# Patient Record
Sex: Female | Born: 1965
Health system: Southern US, Community
[De-identification: ages and names within clinical notes are randomized; demographics above are authoritative.]

## PROBLEM LIST (undated history)

## (undated) DIAGNOSIS — F329 Major depressive disorder, single episode, unspecified: Secondary | ICD-10-CM

## (undated) DIAGNOSIS — K5792 Diverticulitis of intestine, part unspecified, without perforation or abscess without bleeding: Secondary | ICD-10-CM

## (undated) DIAGNOSIS — I10 Essential (primary) hypertension: Secondary | ICD-10-CM

## (undated) DIAGNOSIS — R002 Palpitations: Secondary | ICD-10-CM

## (undated) DIAGNOSIS — F32A Depression, unspecified: Secondary | ICD-10-CM

## (undated) DIAGNOSIS — T7840XA Allergy, unspecified, initial encounter: Secondary | ICD-10-CM

## (undated) DIAGNOSIS — M1990 Unspecified osteoarthritis, unspecified site: Secondary | ICD-10-CM

## (undated) DIAGNOSIS — F419 Anxiety disorder, unspecified: Secondary | ICD-10-CM

## (undated) DIAGNOSIS — K589 Irritable bowel syndrome without diarrhea: Secondary | ICD-10-CM

## (undated) DIAGNOSIS — M62838 Other muscle spasm: Secondary | ICD-10-CM

## (undated) DIAGNOSIS — K219 Gastro-esophageal reflux disease without esophagitis: Secondary | ICD-10-CM

## (undated) DIAGNOSIS — K5900 Constipation, unspecified: Secondary | ICD-10-CM

## (undated) DIAGNOSIS — R0602 Shortness of breath: Secondary | ICD-10-CM

## (undated) DIAGNOSIS — R030 Elevated blood-pressure reading, without diagnosis of hypertension: Secondary | ICD-10-CM

## (undated) DIAGNOSIS — N83209 Unspecified ovarian cyst, unspecified side: Secondary | ICD-10-CM

## (undated) DIAGNOSIS — R609 Edema, unspecified: Secondary | ICD-10-CM

## (undated) DIAGNOSIS — R109 Unspecified abdominal pain: Secondary | ICD-10-CM

## (undated) HISTORY — PX: ABDOMINAL ADHESION SURGERY: SHX90

## (undated) HISTORY — DX: Unspecified abdominal pain: R10.9

## (undated) HISTORY — DX: Essential (primary) hypertension: I10

## (undated) HISTORY — DX: Constipation, unspecified: K59.00

## (undated) HISTORY — DX: Other muscle spasm: M62.838

## (undated) HISTORY — DX: Depression, unspecified: F32.A

## (undated) HISTORY — DX: Gastro-esophageal reflux disease without esophagitis: K21.9

## (undated) HISTORY — DX: Palpitations: R00.2

## (undated) HISTORY — DX: Unspecified ovarian cyst, unspecified side: N83.209

## (undated) HISTORY — DX: Edema, unspecified: R60.9

## (undated) HISTORY — DX: Anxiety disorder, unspecified: F41.9

## (undated) HISTORY — DX: Shortness of breath: R06.02

## (undated) HISTORY — PX: OTHER SURGICAL HISTORY: SHX169

## (undated) HISTORY — DX: Irritable bowel syndrome, unspecified: K58.9

## (undated) HISTORY — DX: Major depressive disorder, single episode, unspecified: F32.9

## (undated) HISTORY — DX: Unspecified osteoarthritis, unspecified site: M19.90

## (undated) HISTORY — PX: CHOLECYSTECTOMY: SHX55

## (undated) HISTORY — DX: Elevated blood-pressure reading, without diagnosis of hypertension: R03.0

## (undated) HISTORY — DX: Allergy, unspecified, initial encounter: T78.40XA

---

## 1998-05-09 HISTORY — PX: OTHER SURGICAL HISTORY: SHX169

## 1999-06-17 ENCOUNTER — Other Ambulatory Visit: Admission: RE | Admit: 1999-06-17 | Discharge: 1999-06-17 | Payer: Self-pay | Admitting: Emergency Medicine

## 2001-03-28 ENCOUNTER — Encounter: Admission: RE | Admit: 2001-03-28 | Discharge: 2001-03-28 | Payer: Self-pay

## 2001-06-28 ENCOUNTER — Ambulatory Visit (HOSPITAL_COMMUNITY): Admission: RE | Admit: 2001-06-28 | Discharge: 2001-06-28 | Payer: Self-pay

## 2001-10-25 ENCOUNTER — Encounter (INDEPENDENT_AMBULATORY_CARE_PROVIDER_SITE_OTHER): Payer: Self-pay

## 2001-10-25 ENCOUNTER — Inpatient Hospital Stay (HOSPITAL_COMMUNITY): Admission: AD | Admit: 2001-10-25 | Discharge: 2001-10-29 | Payer: Self-pay

## 2003-03-05 ENCOUNTER — Ambulatory Visit (HOSPITAL_COMMUNITY): Admission: RE | Admit: 2003-03-05 | Discharge: 2003-03-05 | Payer: Self-pay | Admitting: Obstetrics and Gynecology

## 2004-02-24 ENCOUNTER — Other Ambulatory Visit: Admission: RE | Admit: 2004-02-24 | Discharge: 2004-02-24 | Payer: Self-pay | Admitting: Obstetrics and Gynecology

## 2004-10-26 ENCOUNTER — Ambulatory Visit (HOSPITAL_COMMUNITY): Admission: RE | Admit: 2004-10-26 | Discharge: 2004-10-26 | Payer: Self-pay | Admitting: Obstetrics and Gynecology

## 2004-10-31 ENCOUNTER — Inpatient Hospital Stay (HOSPITAL_COMMUNITY): Admission: AD | Admit: 2004-10-31 | Discharge: 2004-10-31 | Payer: Self-pay | Admitting: Obstetrics and Gynecology

## 2004-11-04 ENCOUNTER — Inpatient Hospital Stay (HOSPITAL_COMMUNITY): Admission: AD | Admit: 2004-11-04 | Discharge: 2004-11-06 | Payer: Self-pay | Admitting: Obstetrics and Gynecology

## 2005-01-11 ENCOUNTER — Ambulatory Visit (HOSPITAL_COMMUNITY): Admission: RE | Admit: 2005-01-11 | Discharge: 2005-01-11 | Payer: Self-pay | Admitting: Obstetrics and Gynecology

## 2005-01-13 ENCOUNTER — Encounter (INDEPENDENT_AMBULATORY_CARE_PROVIDER_SITE_OTHER): Payer: Self-pay | Admitting: Specialist

## 2005-01-13 ENCOUNTER — Ambulatory Visit (HOSPITAL_COMMUNITY): Admission: RE | Admit: 2005-01-13 | Discharge: 2005-01-13 | Payer: Self-pay | Admitting: Obstetrics and Gynecology

## 2005-04-12 ENCOUNTER — Other Ambulatory Visit: Admission: RE | Admit: 2005-04-12 | Discharge: 2005-04-12 | Payer: Self-pay | Admitting: Obstetrics and Gynecology

## 2005-09-26 ENCOUNTER — Encounter: Admission: RE | Admit: 2005-09-26 | Discharge: 2005-09-26 | Payer: Self-pay | Admitting: Emergency Medicine

## 2005-11-07 ENCOUNTER — Encounter: Admission: RE | Admit: 2005-11-07 | Discharge: 2006-02-05 | Payer: Self-pay | Admitting: Emergency Medicine

## 2006-04-17 ENCOUNTER — Other Ambulatory Visit: Admission: RE | Admit: 2006-04-17 | Discharge: 2006-04-17 | Payer: Self-pay | Admitting: Obstetrics and Gynecology

## 2007-04-11 ENCOUNTER — Ambulatory Visit (HOSPITAL_COMMUNITY): Admission: RE | Admit: 2007-04-11 | Discharge: 2007-04-11 | Payer: Self-pay | Admitting: Obstetrics and Gynecology

## 2007-05-07 ENCOUNTER — Other Ambulatory Visit: Admission: RE | Admit: 2007-05-07 | Discharge: 2007-05-07 | Payer: Self-pay | Admitting: Obstetrics and Gynecology

## 2007-11-20 ENCOUNTER — Encounter: Admission: RE | Admit: 2007-11-20 | Discharge: 2007-11-20 | Payer: Self-pay | Admitting: Obstetrics and Gynecology

## 2007-11-27 ENCOUNTER — Encounter: Admission: RE | Admit: 2007-11-27 | Discharge: 2007-11-27 | Payer: Self-pay | Admitting: Gastroenterology

## 2007-11-29 ENCOUNTER — Encounter: Admission: RE | Admit: 2007-11-29 | Discharge: 2007-11-29 | Payer: Self-pay | Admitting: Gastroenterology

## 2007-12-07 ENCOUNTER — Ambulatory Visit (HOSPITAL_COMMUNITY): Admission: RE | Admit: 2007-12-07 | Discharge: 2007-12-07 | Payer: Self-pay | Admitting: Gastroenterology

## 2007-12-31 ENCOUNTER — Ambulatory Visit (HOSPITAL_COMMUNITY): Admission: RE | Admit: 2007-12-31 | Discharge: 2007-12-31 | Payer: Self-pay | Admitting: Obstetrics and Gynecology

## 2008-04-19 ENCOUNTER — Emergency Department (HOSPITAL_COMMUNITY): Admission: EM | Admit: 2008-04-19 | Discharge: 2008-04-19 | Payer: Self-pay | Admitting: Emergency Medicine

## 2008-05-13 ENCOUNTER — Other Ambulatory Visit: Admission: RE | Admit: 2008-05-13 | Discharge: 2008-05-13 | Payer: Self-pay | Admitting: Obstetrics and Gynecology

## 2009-06-05 ENCOUNTER — Other Ambulatory Visit: Admission: RE | Admit: 2009-06-05 | Discharge: 2009-06-05 | Payer: Self-pay | Admitting: Obstetrics and Gynecology

## 2010-01-12 ENCOUNTER — Ambulatory Visit (HOSPITAL_COMMUNITY): Admission: RE | Admit: 2010-01-12 | Discharge: 2010-01-12 | Payer: Self-pay | Admitting: Obstetrics and Gynecology

## 2010-02-25 ENCOUNTER — Ambulatory Visit (HOSPITAL_COMMUNITY): Admission: RE | Admit: 2010-02-25 | Discharge: 2010-02-25 | Payer: Self-pay | Admitting: Obstetrics and Gynecology

## 2010-05-30 ENCOUNTER — Encounter: Payer: Self-pay | Admitting: Obstetrics and Gynecology

## 2010-07-21 LAB — CBC
MCH: 31.6 pg (ref 26.0–34.0)
MCHC: 34.2 g/dL (ref 30.0–36.0)
MCV: 92.3 fL (ref 78.0–100.0)
Platelets: 234 10*3/uL (ref 150–400)
RBC: 4.31 MIL/uL (ref 3.87–5.11)
WBC: 7.7 10*3/uL (ref 4.0–10.5)

## 2010-09-21 NOTE — Op Note (Signed)
Donna [name], Donna [name]              ACCOUNT NO.:  [id number]   MEDICAL RECORD NO.:  [id number]          PATIENT TYPE:  AMB   LOCATION:  SDC                           FACILITY:  [facility]   PHYSICIAN:  Charles A. Delcambre, MDDATE OF BIRTH:  August 02, 1965   DATE OF PROCEDURE:  12/31/2007  DATE OF DISCHARGE:                               OPERATIVE REPORT   PREOPERATIVE DIAGNOSIS:  Pelvic pain.   POSTOPERATIVE DIAGNOSIS:  Pelvic adhesive disease.   PROCEDURE:  1. Diagnostic laparoscopy.  2. Lysis of adhesions.   SURGEON:  Charles A. Delcambre, MD   ASSISTANT:  None.   COMPLICATIONS:  None.   ESTIMATED BLOOD LOSS:  Less than 5 mL.   OPERATIVE FINDINGS:  Left colon adherent up to the left side wall.   INSTRUMENT, SPONGE, AND NEEDLE COUNT:  Correct x2.   DESCRIPTION OF PROCEDURE:  The patient was taken to the operating room  and placed in supine position.  General anesthetic was induced without  difficulty.  She was then placed in dorsal lithotomy position.  Acorn  cannula was placed on the cervix with a tenaculum.  Attention was turned  to the abdomen where 0.25% plain Marcaine was injected in the suprapubic  side as well as the umbilical side.  A total of 9 mL was divided.  A 1-  cm incision was made at the umbilicus.  Traction on the anterior  abdominal wall was used to place a Veress needle.  Aspiration injection,  reaspiration, hanging drop test and insufflation pressures less than 8  mmHg at 2 liters per minute.  CO2 insufflation yielded intraperitoneal  location.  This was verified by traction of the abdomen after 2.5 liters  were insufflated and 10-mm trocar was placed without difficulty.  The  scope was placed.  Immediate verification of intraperitoneal location  was noted.  Under direct visualization, suprapubic port was made through  a stab incision.  There was no damage to bowel, bladder, or vascular  structures.  Manipulating rod was placed to the lower trocar.  Multiple  pictures were taken to show normal anatomy.  The only abnormality was  the colon; tented up quite sharply on the left pelvic side wall, left  abdominal side wall, posterior cul-de-sac, ureters, ovaries, uterus,  anterior cul-de-sac, gallbladder, appendix, and bowel, in general all  looked normal.  Unipolar cautery was used with Metzenbaum scissors to  take down the adhesion fully, dropping the colon peritoneum down.  Hemostasis was achieved with bipolar cautery and was excellent.  Desufflation was allowed to occur.  There was no damage to bowel noted,  and the sleeve was viewed upon withdrawal of the trocar at the  umbilicus.  A 0 Vicryl was placed to close the fascia at  the umbilicus, 3-0 Monocryl was used to close the skin.  Dermabond was  used to close the lower trocar skin.  Tenaculum was removed.  Hemostasis  was excellent.  The patient was awakened and taken to recovery with  persistent tenderness having tolerated the procedure well.      Charles A. Sydnee Cabal, MD  Electronically Signed  CAD/MEDQ  D:  12/31/2007  T:  01/01/2008  Job:  [id number]

## 2010-09-21 NOTE — H&P (Signed)
[name], [name]              ACCOUNT NO.:  [id number]   MEDICAL RECORD NO.:  [id number]          PATIENT TYPE:  AMB   LOCATION:  SDC                           [facility]  [facility]   PHYSICIAN:  Donna [name], MDDATE OF BIRTH:  10-03-1965   DATE OF ADMISSION:  12/31/2007  DATE OF DISCHARGE:                              HISTORY & PHYSICAL   The patient to be admitted on December 31, 2007, to undergo diagnostic  laparoscopy, pelvic pain is across the pelvis, seems to start on the  right, go to the left, but mainly then remained in the worse type pain  in the left lower quadrant.  She has recurrent bloating.  Ultrasound  shows an IUD to be in place, but no other abnormalities.  GC and  chlamydia cultures were negative.  She is a 45 year old gravida 2, para  2.  Denies urgency, frequency, or dysuria.  No diarrhea, no significant  constipation, and no bleeding per rectum.   PAST MEDICAL HISTORY:  Seasonal allergies.   SURGICAL HISTORY:  Knee surgery, SVD x2, wisdom teeth, and D&C.   MEDICATIONS:  Vitamins, iron, and charcoal tablets.   ALLERGIES:  SULFA, CODEINE, and LEVAQUIN, reactions not specified.   SOCIAL HISTORY:  No tobacco, ethanol, or drug use, or STD exposure in  the past.  She is married and in monogamous relationship with her  husband of 8 years.   FAMILY HISTORY:  Breast cancer in grandmother and maternal aunt.  Cardiac history on her father side, father deceased from stroke,  dialysis with recent coronary artery disease.   REVIEW OF SYSTEMS:  Denies fever, chills, rashes, lesions on the skin,  headaches, dizziness, current seasonal allergies, chest pain, shortness  of breath, wheezing, diarrhea, constipation, bleeding, melena,  hematochezia, urgency or frequency, dysuria, incontinence, hematuria,  galactorrhea, or emotional changes.   PHYSICAL EXAMINATION:  GENERAL:  Alert and oriented x3.  VITAL SIGNS:  Blood pressure 130/88, heart rate 80, respirations 20,  and  afebrile.  HEART:  Regular rate and rhythm.  LUNGS:  Clear bilaterally.  ABDOMEN:  Nontender to palpation in all quadrants.  PELVIC:  Normal external female genitalia.  Bartholin, urethral, and  Skene within normal limits.  Vagina without discharge or lesions.  IUD  strings were seen.  GC and chlamydia cultures previously done were  negative.  Uterus is nontender.  Right adnexa is nontender.  Left adnexa  is moderately tender, reproducing her pain findings, especially when I  reach out at the pelvis with bimanual examination.   ASSESSMENT:  Pelvic pain.   PLAN:  Diagnostic laparoscopy.  She accepts risks of infection,  bleeding, bowel and bladder damage, blood product risk including  hepatitis C, HIV exposure, and ureteral damage.  All questions were  answered.  She gives informed consent.  She understands that we may not  find anything and cannot guarantee that we will find anything on surgery  or relieve  her pain.  She had a GI workup, which was basically  negative.  We will proceed as outlined NPO post midnight beginning  morning medications with a sip of  water.  Further information, she did  have a CT of the abdomen and pelvis, which was a benign pelvis and  benign abdomen, and ultrasound was negative.      Donna A. Sydnee Cabal, MD  Electronically Signed     CAD/MEDQ  D:  12/27/2007  T:  12/28/2007  Job:  [id number]

## 2010-09-21 NOTE — H&P (Signed)
Donna [name], Donna [name]              ACCOUNT NO.:  [id number]   MEDICAL RECORD NO.:  [id number]          PATIENT TYPE:  AMB   LOCATION:  SDC                           [facility]   PHYSICIAN:  Charles A. Delcambre, MDDATE OF BIRTH:  Jul 20, 1965   DATE OF ADMISSION:  DATE OF DISCHARGE:                              HISTORY & PHYSICAL   The patient is to be admitted on November 21, 2007 for diagnostic  laparoscopy.  She has pelvic pain mainly localized over the left side in  the left lower quadrant and a bit of that in the pelvis.  She complains  of recurrent bloating and intermittently is tearful, but then will get  joking around.  She states she is frustrated about nothing being found  with Dr. Georgina Pillion doing upper abdomen ultrasound.  She had an ultrasound  today in our office which shows an IUD in place.  GC chlamydia cultures  were done as well.  No adnexal masses were seen and the patient was  scanned over the area of her pain in the left lower quadrant.  No masses  or free fluid was seen.  Ovaries appeared normal.  She is a gravida 2,  para 2, and 45 years old.  She has no urgency, frequency, or dysuria.  No diarrhea, no significant constipation, no blood per rectum, and of  course is painful secondary to her left lower quadrant pain.   PAST MEDICAL HISTORY:  Seasonal allergies.   PAST SURGICAL HISTORY:  Knee surgery, STD x2, wisdom teeth, D&C.   MEDICATIONS:  Vitamins, iron, and charcoal tablets.   ALLERGIES:  SULFA, CODEINE, and LEVAQUIN, reactions not specified.   SOCIAL HISTORY:  Denies tobacco, ethanol, or drug use or STD exposure in  the past.  She is married in a monogamous relationship with her husband  of 80 years.   FAMILY HISTORY:  Breast cancer in her grandmother and maternal aunt.  Cardiac history on her father's side.  Father deceased from stroke,  dialysis, obesity, and coronary artery disease.   REVIEW OF SYSTEMS:  Denies fever, chills, rashes, lesions on skin,  headaches, dizziness, current seasonal allergies, chest pain, shortness  of breath, wheezing, diarrhea, constipation, bleeding, melena,  hematochezia, urgency, frequency, dysuria, incontinence, hematuria,  galactorrhea, or emotional changes.   PHYSICAL EXAMINATION:  GENERAL:  Alert and oriented x3.  VITAL SIGNS:  Blood pressure 120/90, heart rate 80, respirations 20,  afebrile.  CORONARY:  Regular rate and rhythm.  LUNGS:  Clear bilaterally.  ABDOMEN:  Upper quadrant soft and nontender.  Right lower quadrant  nontender.  Suprapubic area minimally tender.  Left lower quadrant  moderately tender but is easily palpated.  No guarding or peritoneal  signs are present.  PELVIC:  Normal external female genitalia.  Bartholin, urethra, Skene  within normal limit.  Vulva without discharge or lesions.  IUD strings  were seen.  GC chlamydia cultures were done today.  Uterus is nontender.  Right adnexa is nontender.  Left adnexa is moderately tender reproducing  her pain findings especially when reaches out of the pelvis with  bimanual exam.  ASSESSMENT:  Pelvic pain.   PLAN:  Diagnostic laparoscopy.  She accepts the risks of infection,  bleeding, bowel and bladder damage, blood product risks including  hepatitis and HIV exposure, ureteral damage.  All questions were  answered.  She gives informed consent.  She understands that we may not  find anything and cannot guarantee that we will find anything on  surgery.  She has not had a GI workup and I have discussed with her that  I feel like more prudent that she have a GI workup before the  gynecological surgery with her bloating symptoms and left lower quadrant  pain with normal ovaries seen.  She declines, however, to go to GI at  this point and request that we proceed with gynecological workup first.  I have informed her that I think she should go to GI and she refuses  until after this surgery.  We will proceed as outlined, n.p.o. after   0700, surgery about 1:30 Wednesday, and preoperative CBC, HCG, and  pregnancy test.  We will get a CT of the abdomen and pelvis tomorrow  with contrast to further evaluate bowel before surgery the next day. GC  chlamydia  cultures were pending after being done today.      Charles A. Sydnee Cabal, MD  Electronically Signed     CAD/MEDQ  D:  11/19/2007  T:  11/20/2007  Job:  [id number]   cc:   Oley Balm. Georgina Pillion, M.D.  Fax: [phone]

## 2010-09-24 NOTE — H&P (Signed)
Donna [name], Donna [name]              ACCOUNT NO.:  [id number]   MEDICAL RECORD NO.:  [id number]          PATIENT TYPE:  OUT   LOCATION:  ULT                           [facility]   PHYSICIAN:  Charles A. Delcambre, MDDATE OF BIRTH:  01/11/1966   DATE OF ADMISSION:  01/13/2005  DATE OF DISCHARGE:                                HISTORY & PHYSICAL   CHIEF COMPLAINT:  1.  Abnormal uterine bleeding  2.  Possible retained products of conception, postpartum, eight weeks.   HISTORY OF PRESENT ILLNESS:  A 45 year old, para 2-0-0-2 presents now with  bleeding off and on to this point almost continuously with some spurts.  Seen by Dr. [name] last week with hCG negative.  Hemoglobin 13.4,  hematocrit 38.8, platelets normal at 336.  Denies fever or chills.  Ultrasound was done which showed heterogeneously thickened endometrium to  the right of the uterine fundus measuring maximum dimension of 9 mm without  increased flow of the Doppler.  Concern for retained products of conception  with differential including debris or clot.  Postpartum course was  complicated by approximately one week postpartum, low grade temperature  100.6 range, and pelvic pain.  The patient treated at that time for  postpartum metritis with good response to Augmentin.  She gives informed  consent for a D&C.  Accepts risks for infection, bleeding, bowel and bladder  damage, uterine perforation risk, blood product risk including hepatitis and  HIV exposure, possibility of failed D&C, repeat D&C.   PAST MEDICAL HISTORY:  1.  Seasonal allergies.  2.  Pregnancy-induced hypertension in the past.   PAST SURGICAL HISTORY:  1.  Knee surgery.  2.  SVD x2.  3.  Wisdom teeth.   MEDICATIONS:  Currently none.  Recently completed Z-pack for a URI with  complete resolution of symptoms now.   ALLERGIES:  LEVAQUIN, SULFA, TEQUIN, CODEINE.   SOCIAL HISTORY:  No tobacco, ethanol or drug use or STD exposure in the  past.  The  patient is married, in a monogamous relationship with her  husband.   FAMILY HISTORY:  Breast cancer in grandmother and maternal aunt.  Cardiac  history on her father's side.  Father deceased with a stroke, dialysis and  obesity and coronary artery disease.  Otherwise negative major illnesses.   REVIEW OF SYSTEMS:  No fever or chills or skin rashes.  No chest pain,  shortness of breath, wheezing.  No diarrhea, constipation or bleeding.  No  urgency, frequency or dysuria.   PHYSICAL EXAMINATION:  GENERAL:  Alert and oriented x3.  No distress.  VITAL SIGNS:  Blood pressure 130/84, respirations 20, pulse 88.  HEENT:  Grossly within normal limits.  NECK:  Supple without thyromegaly or adenopathy.  LUNGS:  Clear bilaterally.  HEART:  Regular rate and rhythm without murmur, rub or gallop.  BREASTS:  Symmetrical. Otherwise deferred.  The patient is lactating.  ABDOMEN:  Soft, flat and nontender.  No hepatosplenomegaly or other masses  noted.  PELVIC:  Normal external female genitalia.  Bartholin, urethral and Skene's  glands within normal limits. Vault without discharge or lesions.  Uterus not  enlarged, mobile, nontender, midplane.  Adnexa nontender without masses  bilaterally.  EXTREMITIES:  Nontender without significant edema.   ASSESSMENT:  Abnormal uterine bleeding eight weeks postpartum.  The  patient's endometrial finding on ultrasound for possible nidus of retained  tissue.   PLAN:  The patient will be admitted for D&C.  Informed consent was given as  noted above.  She will remain NPO past midnight this evening.  Clears until  0700 in the morning.  N.p.o. thereafter.  Questions were answered. Informed  consent was given. We will proceed as outlined.      Charles A. Sydnee Cabal, MD  Electronically Signed     CAD/MEDQ  D:  01/12/2005  T:  01/12/2005  Job:  [id number]

## 2010-09-24 NOTE — Op Note (Signed)
[name], [name]              ACCOUNT NO.:  [id number]   MEDICAL RECORD NO.:  [id number]          PATIENT TYPE:  AMB   LOCATION:  SDC                           [facility]   PHYSICIAN:  Charles A. Delcambre, MDDATE OF BIRTH:  1965/12/14   DATE OF PROCEDURE:  01/13/2005  DATE OF DISCHARGE:                                 OPERATIVE REPORT   PREOPERATIVE DIAGNOSIS:  Seven-week postpartum possible retained products of  conception.   POSTOPERATIVE DIAGNOSIS:  Seven-week postpartum with likely retained  products of conception.   OPERATION/PROCEDURE:  1.  Paracervical block.  2.  Dilatation and curettage.   SURGEON:  Charles A. Sydnee Cabal, M.D.   ASSISTANT:  None.   COMPLICATIONS:  None.   ESTIMATED BLOOD LOSS:  Less than 50 mL.   ANESTHESIA:  Monitored anesthesia care with IV sedation.   SPECIMENS:  1.  Products of conception (?).  2.  Endometrial curettings.   OPERATIVE FINDINGS:  Moderate amount of necrotic-appearing tissue,  consistent with retained products of conception/placenta.   DESCRIPTION OF PROCEDURE:  The patient was taken to the operating room and  placed in the supine position and sedation was accomplished.  She was placed  in the dorsal lithotomy position and universal stirrups and sterile prep and  drape was undertaken.  A single-tooth tenaculum was placed on the cervix  with the speculum in the vagina and paracervical block was placed with 0.25%  plain Marcaine, 20 mL divided at 4 and 8 o'clock.  No evidence of  intravascular location of injection was noted.  Cervix was opened enough to  pass a medium Hicks dilator and a medium size banjo curet without dilation.  Banjo curet was placed and gentle curettage yielded tissue as noted  above.  Gentle circumferential curettage returned no further tissue.  Hemostasis was adequate.  The tenaculum was removed.  Hemostasis was  verified.  All instruments were removed.  The patient tolerated the  procedure well  and was taken to the recovery room with physician in  attendance.      Charles A. Sydnee Cabal, MD  Electronically Signed     CAD/MEDQ  D:  01/13/2005  T:  01/14/2005  Job:  [id number]

## 2011-06-09 ENCOUNTER — Other Ambulatory Visit (HOSPITAL_COMMUNITY)
Admission: RE | Admit: 2011-06-09 | Discharge: 2011-06-09 | Disposition: A | Discharge status: Home / Self Care | Payer: BC Managed Care – PPO | Source: Ambulatory Visit | Attending: Obstetrics and Gynecology | Admitting: Obstetrics and Gynecology

## 2011-06-09 ENCOUNTER — Other Ambulatory Visit: Payer: Self-pay | Admitting: Nurse Practitioner

## 2011-06-09 DIAGNOSIS — Z01419 Encounter for gynecological examination (general) (routine) without abnormal findings: Secondary | ICD-10-CM | POA: Insufficient documentation

## 2011-06-09 DIAGNOSIS — Z1159 Encounter for screening for other viral diseases: Secondary | ICD-10-CM | POA: Insufficient documentation

## 2013-08-05 ENCOUNTER — Telehealth: Payer: Self-pay

## 2013-08-05 ENCOUNTER — Ambulatory Visit: Payer: Self-pay | Admitting: Family Medicine

## 2013-08-05 VITALS — BP 112/86 | HR 86 | Temp 99.3°F | Resp 16 | Ht 63.75 in | Wt 169.0 lb

## 2013-08-05 DIAGNOSIS — K5732 Diverticulitis of large intestine without perforation or abscess without bleeding: Secondary | ICD-10-CM

## 2013-08-05 DIAGNOSIS — R358 Other polyuria: Secondary | ICD-10-CM

## 2013-08-05 DIAGNOSIS — R3589 Other polyuria: Secondary | ICD-10-CM

## 2013-08-05 DIAGNOSIS — M549 Dorsalgia, unspecified: Secondary | ICD-10-CM

## 2013-08-05 LAB — POCT UA - MICROSCOPIC ONLY
Bacteria, U Microscopic: NEGATIVE
Casts, Ur, LPF, POC: NEGATIVE
Crystals, Ur, HPF, POC: NEGATIVE
Epithelial cells, urine per micros: NEGATIVE
Mucus, UA: NEGATIVE
WBC, Ur, HPF, POC: NEGATIVE
Yeast, UA: NEGATIVE

## 2013-08-05 LAB — POCT URINALYSIS DIPSTICK
Bilirubin, UA: NEGATIVE
Glucose, UA: NEGATIVE
Ketones, UA: NEGATIVE
Leukocytes, UA: NEGATIVE
Nitrite, UA: NEGATIVE
Protein, UA: NEGATIVE
Spec Grav, UA: 1.015
Urobilinogen, UA: 0.2
pH, UA: 8.5

## 2013-08-05 MED ORDER — CIPROFLOXACIN HCL 500 MG PO TABS: 500.0000 mg | ORAL_TABLET | Freq: Two times a day (BID) | ORAL | Status: DC

## 2013-08-05 MED ORDER — CEPHALEXIN 500 MG PO CAPS: 500.0000 mg | ORAL_CAPSULE | Freq: Two times a day (BID) | ORAL | Status: DC

## 2013-08-05 MED ORDER — METRONIDAZOLE 500 MG PO TABS: 500.0000 mg | ORAL_TABLET | Freq: Three times a day (TID) | ORAL | Status: DC

## 2013-08-05 NOTE — Telephone Encounter (Signed)
Patient is taking antibiotic for a diverticulitis flare up. Please advise if it is possible to change this to something alcohol friendly.

## 2013-08-05 NOTE — Patient Instructions (Signed)
Diverticulitis °A diverticulum is a small pouch or sac on the colon. Diverticulosis is the presence of these diverticula on the colon. Diverticulitis is the irritation (inflammation) or infection of diverticula. °CAUSES  °The colon and its diverticula contain bacteria. If food particles block the tiny opening to a diverticulum, the bacteria inside can grow and cause an increase in pressure. This leads to infection and inflammation and is called diverticulitis. °SYMPTOMS  °· Abdominal pain and tenderness. Usually, the pain is located on the left side of your abdomen. However, it could be located elsewhere. °· Fever. °· Bloating. °· Feeling sick to your stomach (nausea). °· Throwing up (vomiting). °· Abnormal stools. °DIAGNOSIS  °Your caregiver will take a history and perform a physical exam. Since many things can cause abdominal pain, other tests may be necessary. Tests may include: °· Blood tests. °· Urine tests. °· X-ray of the abdomen. °· CT scan of the abdomen. °Sometimes, surgery is needed to determine if diverticulitis or other conditions are causing your symptoms. °TREATMENT  °Most of the time, you can be treated without surgery. Treatment includes: °· Resting the bowels by only having liquids for a few days. As you improve, you will need to eat a low-fiber diet. °· Intravenous (IV) fluids if you are losing body fluids (dehydrated). °· Antibiotic medicines that treat infections may be given. °· Pain and nausea medicine, if needed. °· Surgery if the inflamed diverticulum has burst. °HOME CARE INSTRUCTIONS  °· Try a clear liquid diet (broth, tea, or water for as long as directed by your caregiver). You may then gradually begin a low-fiber diet as tolerated.  °A low-fiber diet is a diet with less than 10 grams of fiber. Choose the foods below to reduce fiber in the diet: °· White breads, cereals, rice, and pasta. °· Cooked fruits and vegetables or soft fresh fruits and vegetables without the skin. °· Ground or  well-cooked tender beef, ham, veal, lamb, pork, or poultry. °· Eggs and seafood. °· After your diverticulitis symptoms have improved, your caregiver may put you on a high-fiber diet. A high-fiber diet includes 14 grams of fiber for every 1000 calories consumed. For a standard 2000 calorie diet, you would need 28 grams of fiber. Follow these diet guidelines to help you increase the fiber in your diet. It is important to slowly increase the amount fiber in your diet to avoid gas, constipation, and bloating. °· Choose whole-grain breads, cereals, pasta, and brown rice. °· Choose fresh fruits and vegetables with the skin on. Do not overcook vegetables because the more vegetables are cooked, the more fiber is lost. °· Choose more nuts, seeds, legumes, dried peas, beans, and lentils. °· Look for food products that have greater than 3 grams of fiber per serving on the Nutrition Facts label. °· Take all medicine as directed by your caregiver. °· If your caregiver has given you a follow-up appointment, it is very important that you go. Not going could result in lasting (chronic) or permanent injury, pain, and disability. If there is any problem keeping the appointment, call to reschedule. °SEEK MEDICAL CARE IF:  °· Your pain does not improve. °· You have a hard time advancing your diet beyond clear liquids. °· Your bowel movements do not return to normal. °SEEK IMMEDIATE MEDICAL CARE IF:  °· Your pain becomes worse. °· You have an oral temperature above 102° F (38.9° C), not controlled by medicine. °· You have repeated vomiting. °· You have bloody or black, tarry stools. °·   Symptoms that brought you to your caregiver become worse or are not getting better. °MAKE SURE YOU:  °· Understand these instructions. °· Will watch your condition. °· Will get help right away if you are not doing well or get worse. °Document Released: 02/02/2005 Document Revised: 07/18/2011 Document Reviewed: 05/31/2010 °[employer]® Patient Information  ©2014 [employer]. ° °

## 2013-08-05 NOTE — Telephone Encounter (Signed)
Dr. Milus GlazierLauenstein,  I'm not clear from your note if this was an incidental finding on your exam. Please advise patient regarding putting off the metronidazole until after her wedding on Friday 08/09/2013.

## 2013-08-05 NOTE — Progress Notes (Signed)
48 yo with polyuria and back pain x 3 weeks.  [facility] and urinary tests.  Sig Negs:  Known fever  Associated:  Chills, paresthesias  Objective:  NAD Abdomen:  Soft, tender LLQ with some guarding, no rebound Ext:  Normal inspection of feet. Skin:  No rash  Results for orders placed during the hospital encounter of 02/25/10  CBC      Result Value Ref Range   WBC 7.7  4.0 - 10.5 K/uL   RBC 4.31  3.87 - 5.11 MIL/uL   Hemoglobin 13.6  12.0 - 15.0 g/dL   HCT 16.139.8  09.636.0 - 04.546.0 %   MCV 92.3  78.0 - 100.0 fL   MCH 31.6  26.0 - 34.0 pg   MCHC 34.2  30.0 - 36.0 g/dL   RDW 40.912.7  81.111.5 - 91.415.5 %   Platelets 234  150 - 400 K/uL  PREGNANCY, URINE      Result Value Ref Range   Preg Test, Ur NEGATIVE     Results for orders placed in visit on 08/05/13  POCT URINALYSIS DIPSTICK      Result Value Ref Range   Color, UA lt yellow     Clarity, UA clear     Glucose, UA neg     Bilirubin, UA neg     Ketones, UA neg     Spec Grav, UA 1.015     Blood, UA trace     pH, UA 8.5     Protein, UA neg     Urobilinogen, UA 0.2     Nitrite, UA neg     Leukocytes, UA Negative    POCT UA - MICROSCOPIC ONLY      Result Value Ref Range   WBC, Ur, HPF, POC neg     RBC, urine, microscopic 0-2     Bacteria, U Microscopic neg     Mucus, UA neg     Epithelial cells, urine per micros neg     Crystals, Ur, HPF, POC neg     Casts, Ur, LPF, POC neg     Yeast, UA neg     Assessment: Exam strongly suggests mild diverticulitis. Negative urine tends to exclude kidney stones and UTI.  She's never had a problem with Cipro.   Back pain - Plan: POCT urinalysis dipstick, POCT UA - Microscopic Only, ciprofloxacin (CIPRO) 500 MG tablet, metroNIDAZOLE (FLAGYL) 500 MG tablet  Polyuria - Plan: POCT urinalysis dipstick, POCT UA - Microscopic Only  Diverticulitis of colon (without mention of hemorrhage) - Plan: ciprofloxacin (CIPRO) 500 MG tablet,  metroNIDAZOLE (FLAGYL) 500 MG tablet  Signed, Elvina SidleKurt Malea Swilling, MD

## 2013-08-05 NOTE — Telephone Encounter (Signed)
Pt states that she will be getting married Friday, and the medication flagyl will not allow her to drink alcohol. She would like to know if she could put off taking this medication until after her wedding or can she get something else that will allow her to drink alcohol.     Pharmacy: [facility] on [address] [phone]

## 2013-08-06 ENCOUNTER — Telehealth: Payer: Self-pay

## 2013-08-06 NOTE — Telephone Encounter (Signed)
Spoke to pt, advised her to stop taking flagyl, and p/u keflex that has been sent to pharm.  She is aware.

## 2013-09-23 ENCOUNTER — Ambulatory Visit (INDEPENDENT_AMBULATORY_CARE_PROVIDER_SITE_OTHER): Payer: Self-pay | Admitting: Family Medicine

## 2013-09-23 ENCOUNTER — Ambulatory Visit (HOSPITAL_COMMUNITY)
Admission: RE | Admit: 2013-09-23 | Discharge: 2013-09-23 | Disposition: A | Discharge status: Home / Self Care | Payer: BC Managed Care – PPO | Source: Ambulatory Visit | Attending: Family Medicine | Admitting: Family Medicine

## 2013-09-23 VITALS — BP 130/80 | HR 86 | Temp 99.0°F | Resp 18 | Ht 62.5 in | Wt 165.2 lb

## 2013-09-23 DIAGNOSIS — K5732 Diverticulitis of large intestine without perforation or abscess without bleeding: Secondary | ICD-10-CM

## 2013-09-23 DIAGNOSIS — R1032 Left lower quadrant pain: Secondary | ICD-10-CM

## 2013-09-23 DIAGNOSIS — M549 Dorsalgia, unspecified: Secondary | ICD-10-CM

## 2013-09-23 DIAGNOSIS — R109 Unspecified abdominal pain: Secondary | ICD-10-CM

## 2013-09-23 LAB — IFOBT (OCCULT BLOOD): IFOBT: NEGATIVE

## 2013-09-23 LAB — POCT CBC
GRANULOCYTE PERCENT: 72.7 % (ref 37–80)
HEMATOCRIT: 35.6 % — AB (ref 37.7–47.9)
HEMOGLOBIN: 12 g/dL — AB (ref 12.2–16.2)
Lymph, poc: 2 (ref 0.6–3.4)
MCH: 31.3 pg — AB (ref 27–31.2)
MCHC: 33.7 g/dL (ref 31.8–35.4)
MCV: 92.6 fL (ref 80–97)
MID (CBC): 0.5 (ref 0–0.9)
MPV: 9 fL (ref 0–99.8)
PLATELET COUNT, POC: 240 10*3/uL (ref 142–424)
POC Granulocyte: 6.8 (ref 2–6.9)
POC LYMPH PERCENT: 21.6 %L (ref 10–50)
POC MID %: 5.7 % (ref 0–12)
RBC: 3.84 M/uL — AB (ref 4.04–5.48)
RDW, POC: 13.4 %
WBC: 9.4 10*3/uL (ref 4.6–10.2)

## 2013-09-23 MED ORDER — IOHEXOL 300 MG/ML  SOLN
100.0000 mL | Freq: Once | INTRAMUSCULAR | Status: AC | PRN
  Administered 2013-09-23: 100 mL via INTRAVENOUS

## 2013-09-23 NOTE — Progress Notes (Signed)
This chart was scribed for Donna SorensonEva Shaw, MD by Beverly MilchJ Harrison Collins, ED Scribe. This patient was seen in room 2 and the patient's care was started at 7:43 PM.  Subjective:    Patient ID: Donna [name], female    DOB: 07/03/1965, 48 y.o.   MRN: [id number]   Chief Complaint  Patient presents with  . Diverticulosis    seen last month LLQ pain    HPI   HPI Comments: Donna [name] is a 48 y.o. female who presents to the [facility] complaining of abdominal pain. Pt saw Dr. Milus GlazierLauenstein 6 wks ago and was diagnosed with mild diverticulitis by exam due to LLQ tenderness. Pt believes she is still feeling LLQ abdominal pain that she characterizes as "pressure," and feeling worse than her last visit. She states the antibiotics she was prescribed caused her pain with urination and BMs. Pt denies diarrhea and constipation and states her stool was normal. She states her BMs and urination burning continued a few weeks after she finished the prescribed antibiotic. She states she used some Preparation H rectal suppositories without relief. She notes she had some nerve pain and numbness in her feet and legs during her diverticulitis that subsided after the antibiotics. She states the pain to the buttocks down the back of her left legs has begun to return. She states she can't sleep and she's very tender to the touch over her LLQ. Pt states she has a high fiber diet and very much enjoys raw vegetables. She notes eating these foods incites her pain. Reports w/ her prev diverticulitis 6 wks ago she took cipro and keflex as she declined to take flagyl since she didn't want to abstain from EtOH for a week but willing to now if absolutely necessary.   There are no active problems to display for this patient.   Past Medical History  Diagnosis Date  . Allergy     Past Surgical History  Procedure Laterality Date  . Abdominal adhesion surgery      Allergies  Allergen Reactions  . Codeine  Hives  . Levaquin [Levofloxacin In D5w] Other (See Comments)    Blisters on hands  . Omnipred [Prednisolone Acetate] Other (See Comments)    Migraine, emesis  . Sulfa Antibiotics Nausea And Vomiting  . Tequin [Gatifloxacin] Other (See Comments)    Blisters on hands    Prior to Admission medications   Medication Sig Start Date End Date Taking? Authorizing Provider  cephALEXin (KEFLEX) 500 MG capsule Take 1 capsule (500 mg total) by mouth 2 (two) times daily. 08/05/13  Yes Elvina SidleKurt Lauenstein, MD  ciprofloxacin (CIPRO) 500 MG tablet Take 1 tablet (500 mg total) by mouth 2 (two) times daily. 08/05/13  Yes Elvina SidleKurt Lauenstein, MD  levonorgestrel (MIRENA) 20 MCG/24HR IUD 1 each by Intrauterine route once.   Yes Historical Provider, MD  metroNIDAZOLE (FLAGYL) 500 MG tablet Take 1 tablet (500 mg total) by mouth 3 (three) times daily. 08/05/13   Elvina SidleKurt Lauenstein, MD  nitrofurantoin (MACRODANTIN) 100 MG capsule Take 100 mg by mouth 4 (four) times daily.    Historical Provider, MD    History   Social History  . Marital Status: Divorced    Spouse Name: N/A    Number of Children: N/A  . Years of Education: N/A   Occupational History  . Not on file.   Social History Main Topics  . Smoking status: Never Smoker   . Smokeless tobacco: Never Used  .  Alcohol Use: Yes  . Drug Use: No  . Sexual Activity: Not on file   Other Topics Concern  . Not on file   Social History Narrative  . No narrative on file      Review of Systems  Constitutional: Positive for activity change. Negative for fever and chills.  HENT: Negative for congestion, ear discharge, ear pain, facial swelling, postnasal drip, rhinorrhea, sinus pressure, sneezing and sore throat.   Eyes: Negative for pain and discharge.  Respiratory: Negative for apnea, cough, chest tightness and shortness of breath.   Cardiovascular: Negative for chest pain and leg swelling.  Gastrointestinal: Positive for abdominal pain, anal bleeding and rectal  pain. Negative for nausea, vomiting, diarrhea, constipation, blood in stool and abdominal distention.       Pt reports pain with urination and defecation.  Endocrine: Negative for polydipsia and polyuria.  Genitourinary: Positive for dysuria. Negative for urgency, frequency, hematuria, flank pain, decreased urine volume, vaginal bleeding, vaginal discharge and vaginal pain.  Musculoskeletal: Positive for arthralgias, back pain and myalgias.  Neurological: Positive for numbness. Negative for headaches.     Objective:   Physical Exam  Constitutional: She is oriented to person, place, and time. She appears well-developed and well-nourished. She appears ill. No distress.  Pt has slowed gait and clenching abdomen.  HENT:  Head: Normocephalic and atraumatic.  Neck: Normal range of motion. Neck supple. No thyromegaly present.  Cardiovascular: Normal rate, regular rhythm, normal heart sounds and intact distal pulses.   Pulmonary/Chest: Effort normal and breath sounds normal. No respiratory distress.  Abdominal: Soft. Normal appearance. She exhibits no mass. Bowel sounds are decreased. There is no hepatosplenomegaly. There is tenderness in the left lower quadrant. There is rebound. There is no rigidity, no guarding, no CVA tenderness and no tenderness at McBurney's point.  Genitourinary: Rectum normal, vagina normal and uterus normal. Rectal exam shows no mass and no tenderness. Guaiac negative stool. There is no rash, tenderness or lesion on the right labia. There is no rash, tenderness or lesion on the left labia. Uterus is not tender. Cervix exhibits no motion tenderness and no friability. Right adnexum displays no mass, no tenderness and no fullness. Left adnexum displays no mass, no tenderness and no fullness. No erythema or tenderness around the vagina.  Musculoskeletal: She exhibits no edema.  Lymphadenopathy:    She has no cervical adenopathy.       Right: No inguinal adenopathy present.        Left: No inguinal adenopathy present.  Neurological: She is alert and oriented to person, place, and time.  Skin: Skin is warm and dry. She is not diaphoretic. No erythema.  Psychiatric: She has a normal mood and affect. Her behavior is normal.    Triage Vitals: BP 130/80  Pulse 86  Temp(Src) 99 F (37.2 C) (Oral)  Resp 18  Ht 5' 2.5" (1.588 m)  Wt 165 lb 3.2 oz (74.934 kg)  BMI 29.72 kg/m2  SpO2 100%  Results for orders placed in visit on 09/23/13  POCT CBC      Result Value Ref Range   WBC 9.4  4.6 - 10.2 K/uL   Lymph, poc 2.0  0.6 - 3.4   POC LYMPH PERCENT 21.6  10 - 50 %L   MID (cbc) 0.5  0 - 0.9   POC MID % 5.7  0 - 12 %M   POC Granulocyte 6.8  2 - 6.9   Granulocyte percent 72.7  37 - 80 %G  RBC 3.84 (*) 4.04 - 5.48 M/uL   Hemoglobin 12.0 (*) 12.2 - 16.2 g/dL   HCT, POC 82.9 (*) 56.2 - 47.9 %   MCV 92.6  80 - 97 fL   MCH, POC 31.3 (*) 27 - 31.2 pg   MCHC 33.7  31.8 - 35.4 g/dL   RDW, POC 13.0     Platelet Count, POC 240  142 - 424 K/uL   MPV 9.0  0 - 99.8 fL  IFOBT (OCCULT BLOOD)      Result Value Ref Range   IFOBT Negative      Assessment & Plan:  7:58 PM- Pt advised of plan for treatment and pt agrees. Abdominal pain - Plan: POCT CBC, IFOBT POC (occult bld, rslt in office)  LLQ abdominal pain - Plan: CT Abdomen Pelvis W Contrast  Back pain - Plan: ciprofloxacin (CIPRO) 500 MG tablet  Diverticulitis of colon (without mention of hemorrhage) - Plan: ciprofloxacin (CIPRO) 500 MG tablet - pt already has flagyl 500 tid x 7d at home which she was rx'ed w/ prev illness but didn't take - will start in addition to cipro. Clear liquids only until recheck in office in 2-3d - sooner if worse.  Meds ordered this encounter  Medications  . ciprofloxacin (CIPRO) 500 MG tablet    Sig: Take 1 tablet (500 mg total) by mouth 2 (two) times daily.    Dispense:  28 tablet    Refill:  0    I personally performed the services described in this documentation, which was scribed  in my presence. The recorded information has been reviewed and considered, and addended by me as needed.  Donna Sorenson, MD MPH    EXAM: CT ABDOMEN AND PELVIS WITH CONTRAST  TECHNIQUE: Multidetector CT imaging of the abdomen and pelvis was performed using the standard protocol following bolus administration of intravenous contrast.  CONTRAST: OMNIPAQUE IOHEXOL 300 MG/ML SOLN  COMPARISON: CT of the abdomen and pelvis 11/20/2007.  FINDINGS: Lung Bases: Unremarkable.  Abdomen/Pelvis: Numerous colonic diverticulae are noted, particularly in the region of the sigmoid colon. Throughout the proximal sigmoid colon there is associated bowel wall thickening and inflammatory changes in the adjacent sigmoid mesocolon, compatible with acute diverticulitis. No definite diverticular abscess is identified at this time. No signs of frank perforation are noted.  The appearance of the liver, gallbladder, pancreas, spleen, bilateral adrenal glands and bilateral kidneys is unremarkable. No significant volume of ascites. No pneumoperitoneum. No pathologic distention of small bowel. No lymphadenopathy identified within the abdomen or pelvis. Normal appendix. IUD present in the endometrial canal appears properly located. Uterus and ovaries are otherwise unremarkable in appearance. Urinary bladder is normal in appearance.  Musculoskeletal: Well-defined sclerotic lesion with narrow zone of transition in the right side of the sacrum is most compatible with a small bone island. There are no aggressive appearing lytic or blastic lesions noted in the visualized portions of the skeleton.  IMPRESSION: 1. Acute diverticulitis in the proximal sigmoid colon, without diverticular abscess or signs of frank perforation at this time. 2. Normal appendix. 3. Additional incidental findings, as above.   Electronically Signed By: Trudie Reed M.D. On: 09/23/2013 23:00

## 2013-09-24 MED ORDER — CIPROFLOXACIN HCL 500 MG PO TABS: 500.0000 mg | ORAL_TABLET | Freq: Two times a day (BID) | ORAL | Status: DC

## 2013-09-25 ENCOUNTER — Telehealth: Payer: Self-pay

## 2013-09-25 NOTE — Telephone Encounter (Signed)
Patient wants to know if Dr. Clelia CroftShaw can call her with the results of her test she had performed.  She does not have insurance.    [phone]

## 2013-09-25 NOTE — Telephone Encounter (Signed)
Results are in imaging please advise directions to call pt back with.

## 2013-09-25 NOTE — Telephone Encounter (Signed)
I spoke with pt and gave her the option to come in for a re-check or sign up for Mychart. She chose to sign up for mychart because of her lack of insurance. She states she is feeling better.

## 2013-09-25 NOTE — Telephone Encounter (Signed)
I talked with pt and her husband and told them the imaging results the same evening she had the CT done 5/18 - diverticulitis but no abscess. She is due to come in for a recheck now anyway so will be happy to review in detail w/ her at her appt or if she signs up for mychart she can see the report in entirety

## 2013-09-26 NOTE — Telephone Encounter (Signed)
LMVM to CB. 

## 2013-09-26 NOTE — Telephone Encounter (Signed)
Still REALLY recommend she come in for a recheck by the end of the weekend - will need to see if she needs a longer course of antibiotics. Since she apparently is not going to come into for a recheck as recommended in 2-3d from diagnosis before advancing her diet from clear liquids, she needs to be told to advance her diet VERY slowly - as long as feeling GREAT can advance to all liquids and then slowly to soft foods.

## 2013-09-26 NOTE — Telephone Encounter (Signed)
Patient called back.  Advised her that Dr. Clelia CroftShaw really wants her to RTC for a recheck.  She said that she will come Monday.  I gave her Dr. Alver FisherShaw's schedule.

## 2013-09-30 ENCOUNTER — Ambulatory Visit (INDEPENDENT_AMBULATORY_CARE_PROVIDER_SITE_OTHER): Payer: Self-pay | Admitting: Family Medicine

## 2013-09-30 VITALS — BP 108/66 | HR 87 | Temp 97.0°F | Resp 18 | Ht 63.5 in | Wt 162.0 lb

## 2013-09-30 DIAGNOSIS — K5732 Diverticulitis of large intestine without perforation or abscess without bleeding: Secondary | ICD-10-CM

## 2013-09-30 DIAGNOSIS — M549 Dorsalgia, unspecified: Secondary | ICD-10-CM

## 2013-09-30 MED ORDER — METRONIDAZOLE 500 MG PO TABS: 500.0000 mg | ORAL_TABLET | Freq: Two times a day (BID) | ORAL | Status: DC

## 2013-09-30 MED ORDER — FLUCONAZOLE 150 MG PO TABS: 150.0000 mg | ORAL_TABLET | Freq: Once | ORAL | Status: DC

## 2013-09-30 NOTE — Patient Instructions (Signed)
Diverticulitis °A diverticulum is a small pouch or sac on the colon. Diverticulosis is the presence of these diverticula on the colon. Diverticulitis is the irritation (inflammation) or infection of diverticula. °CAUSES  °The colon and its diverticula contain bacteria. If food particles block the tiny opening to a diverticulum, the bacteria inside can grow and cause an increase in pressure. This leads to infection and inflammation and is called diverticulitis. °SYMPTOMS  °· Abdominal pain and tenderness. Usually, the pain is located on the left side of your abdomen. However, it could be located elsewhere. °· Fever. °· Bloating. °· Feeling sick to your stomach (nausea). °· Throwing up (vomiting). °· Abnormal stools. °DIAGNOSIS  °Your caregiver will take a history and perform a physical exam. Since many things can cause abdominal pain, other tests may be necessary. Tests may include: °· Blood tests. °· Urine tests. °· X-ray of the abdomen. °· CT scan of the abdomen. °Sometimes, surgery is needed to determine if diverticulitis or other conditions are causing your symptoms. °TREATMENT  °Most of the time, you can be treated without surgery. Treatment includes: °· Resting the bowels by only having liquids for a few days. As you improve, you will need to eat a low-fiber diet. °· Intravenous (IV) fluids if you are losing body fluids (dehydrated). °· Antibiotic medicines that treat infections may be given. °· Pain and nausea medicine, if needed. °· Surgery if the inflamed diverticulum has burst. °HOME CARE INSTRUCTIONS  °· Try a clear liquid diet (broth, tea, or water for as long as directed by your caregiver). You may then gradually begin a low-fiber diet as tolerated.  °A low-fiber diet is a diet with less than 10 grams of fiber. Choose the foods below to reduce fiber in the diet: °· White breads, cereals, rice, and pasta. °· Cooked fruits and vegetables or soft fresh fruits and vegetables without the skin. °· Ground or  well-cooked tender beef, ham, veal, lamb, pork, or poultry. °· Eggs and seafood. °· After your diverticulitis symptoms have improved, your caregiver may put you on a high-fiber diet. A high-fiber diet includes 14 grams of fiber for every 1000 calories consumed. For a standard 2000 calorie diet, you would need 28 grams of fiber. Follow these diet guidelines to help you increase the fiber in your diet. It is important to slowly increase the amount fiber in your diet to avoid gas, constipation, and bloating. °· Choose whole-grain breads, cereals, pasta, and brown rice. °· Choose fresh fruits and vegetables with the skin on. Do not overcook vegetables because the more vegetables are cooked, the more fiber is lost. °· Choose more nuts, seeds, legumes, dried peas, beans, and lentils. °· Look for food products that have greater than 3 grams of fiber per serving on the Nutrition Facts label. °· Take all medicine as directed by your caregiver. °· If your caregiver has given you a follow-up appointment, it is very important that you go. Not going could result in lasting (chronic) or permanent injury, pain, and disability. If there is any problem keeping the appointment, call to reschedule. °SEEK MEDICAL CARE IF:  °· Your pain does not improve. °· You have a hard time advancing your diet beyond clear liquids. °· Your bowel movements do not return to normal. °SEEK IMMEDIATE MEDICAL CARE IF:  °· Your pain becomes worse. °· You have an oral temperature above 102° F (38.9° C), not controlled by medicine. °· You have repeated vomiting. °· You have bloody or black, tarry stools. °·   Symptoms that brought you to your caregiver become worse or are not getting better. °MAKE SURE YOU:  °· Understand these instructions. °· Will watch your condition. °· Will get help right away if you are not doing well or get worse. °Document Released: 02/02/2005 Document Revised: 07/18/2011 Document Reviewed: 05/31/2010 °[employer]® Patient Information  ©2014 [employer]. ° °

## 2013-09-30 NOTE — Progress Notes (Signed)
Subjective:  This chart was scribed for Donna [name]. Donna Croft, MD  by Donna [name], [facility] Scribe. The patient was seen in room and the patient's care was started at 11:20 AM.  Patient ID: Donna [name], female    DOB: 1965-12-30, 48 y.o.   MRN: [id number] Chief Complaint  Patient presents with  . Follow-up    abd pain     HPI HPI Comments: Donna [name] is a 48 y.o. female who presents to the [facility] for a follow up. Pt was seen two months ago for treatment of diverticulitis. She was treated with Cipro and Keflex. Pt did not want to abstain from alcohol long enough to use Flagyl. She reported that her symptoms improved by did not resolve. For instance when she eats the sharp pain returns. She was seen seven days ago for severe abdominal pain. Due to concerns of developing acute abdominal pain she was sent for a CT with contrast. The CT indicated diverticulitis. Pt was placed on Cipro bid x 2 weeks and flagyl tid x 1 week. She limited her diet to clear liquids for 2-3 days. Pt did not want to return for recheck due to complications with her insurance. She was later advised to change her diet from liquids to soft foods. She complains today that she has bloating and full satiety with eating any kind of food. She eats a banana and does not have the desire to eat for the rest of the day. Pt has "baby poop" and lower fecal output.  She eats cooked burgers, vegetables, and fish. Pt changed her diet. She denies increased flatulence and belching.She reports having back pain and fatigue. When she urinates she has an "uncomfortable"  burning sensation. Pt reports having severe sunburn. Denies fever, chills, and  diaphoresis. Denies vaginal discharge.  She denies nausea, vomiting, and diarrhea. Denies dark stools, hematochezia, and constipation. Denies abdominal cramping. Pt lost 7 lbs in the past month. Denies drinking any alcohol.   Past Medical History    Diagnosis Date  . Allergy    Current Outpatient Prescriptions on File Prior to Visit  Medication Sig Dispense Refill  . ciprofloxacin (CIPRO) 500 MG tablet Take 1 tablet (500 mg total) by mouth 2 (two) times daily.  28 tablet  0  . levonorgestrel (MIRENA) 20 MCG/24HR IUD 1 each by Intrauterine route once.       No current facility-administered medications on file prior to visit.   Allergies  Allergen Reactions  . Codeine Hives  . Levaquin [Levofloxacin In D5w] Other (See Comments)    Blisters on hands  . Omnipred [Prednisolone Acetate] Other (See Comments)    Migraine, emesis  . Sulfa Antibiotics Nausea And Vomiting  . Tequin [Gatifloxacin] Other (See Comments)    Blisters on hands      Review of Systems  Constitutional: Positive for appetite change, fatigue and unexpected weight change. Negative for fever and diaphoresis.  Gastrointestinal: Positive for abdominal pain. Negative for nausea, vomiting, diarrhea, constipation and blood in stool.  Genitourinary: Positive for dysuria.  Musculoskeletal: Positive for back pain.       Objective:   Physical Exam  Nursing note and vitals reviewed. Constitutional: She appears well-developed and well-nourished. No distress.  HENT:  Head: Normocephalic and atraumatic.  Eyes: Pupils are equal, round, and reactive to light.  Neck: Normal range of motion.  Cardiovascular: Normal rate, regular rhythm and normal heart sounds.   Pulmonary/Chest: Effort normal and  breath sounds normal. No respiratory distress. She has no wheezes. She has no rales.  Abdominal: Soft. She exhibits no distension. There is tenderness. There is no rebound and no guarding.  Hypoactive bowel sounds seen in all four quadrants. No hepatosplenomegaly. Mild RLQ pain. Significant LLQ pain.   Skin: Skin is warm and dry. She is not diaphoretic.   Filed Vitals:   09/30/13 1037  BP: 108/66  Pulse: 87  Temp: 97 F (36.1 C)  TempSrc: Oral  Resp: 18  Height: 5' 3.5"  (1.613 m)  Weight: 162 lb (73.483 kg)  SpO2: 100%   DIAGNOSTIC STUDIES: Oxygen Saturation is 100% on RA, normal by my interpretation.       Assessment & Plan:   Diverticulitis of colon (without mention of hemorrhage) - Plan: metroNIDAZOLE (FLAGYL) 500 MG tablet  Back pain - Plan: metroNIDAZOLE (FLAGYL) 500 MG tablet  COORDINATION OF CARE:  11:36 AM Discussed course of care with pt. Advised pt to call me in a few days to follow-up.  Pt understands and agrees.   Meds ordered this encounter  Medications  . metroNIDAZOLE (FLAGYL) 500 MG tablet    Sig: Take 1 tablet (500 mg total) by mouth 2 (two) times daily.    Dispense:  14 tablet    Refill:  0  . fluconazole (DIFLUCAN) 150 MG tablet    Sig: Take 1 tablet (150 mg total) by mouth once. For yeast infection.  Repeat after 3 days if needed    Dispense:  1 tablet    Refill:  1    I personally performed the services described in this documentation, which was scribed in my presence. The recorded information has been reviewed and considered, and addended by me as needed.  Donna SorensonEva Paysen Goza, MD MPH

## 2014-02-21 ENCOUNTER — Other Ambulatory Visit: Payer: Self-pay

## 2014-07-24 ENCOUNTER — Other Ambulatory Visit: Payer: Self-pay | Admitting: Family Medicine

## 2014-07-24 DIAGNOSIS — Z1231 Encounter for screening mammogram for malignant neoplasm of breast: Secondary | ICD-10-CM

## 2014-08-05 ENCOUNTER — Ambulatory Visit (HOSPITAL_COMMUNITY)
Admission: RE | Admit: 2014-08-05 | Discharge: 2014-08-05 | Disposition: A | Discharge status: Home / Self Care | Payer: Self-pay | Source: Ambulatory Visit | Attending: Family Medicine | Admitting: Family Medicine

## 2014-08-05 DIAGNOSIS — Z1231 Encounter for screening mammogram for malignant neoplasm of breast: Secondary | ICD-10-CM

## 2014-08-07 ENCOUNTER — Other Ambulatory Visit: Payer: Self-pay | Admitting: Family Medicine

## 2014-08-07 DIAGNOSIS — R928 Other abnormal and inconclusive findings on diagnostic imaging of breast: Secondary | ICD-10-CM

## 2014-08-11 ENCOUNTER — Other Ambulatory Visit (HOSPITAL_COMMUNITY): Payer: Self-pay | Admitting: *Deleted

## 2014-08-11 DIAGNOSIS — R928 Other abnormal and inconclusive findings on diagnostic imaging of breast: Secondary | ICD-10-CM

## 2014-08-27 ENCOUNTER — Other Ambulatory Visit: Payer: Self-pay

## 2014-08-28 ENCOUNTER — Ambulatory Visit (HOSPITAL_COMMUNITY)
Admission: RE | Admit: 2014-08-28 | Discharge: 2014-08-28 | Disposition: A | Discharge status: Home / Self Care | Payer: Self-pay | Source: Ambulatory Visit | Attending: Obstetrics and Gynecology | Admitting: Obstetrics and Gynecology

## 2014-08-28 ENCOUNTER — Encounter (HOSPITAL_COMMUNITY): Payer: Self-pay

## 2014-08-28 VITALS — BP 124/82 | Temp 98.2°F | Ht 63.5 in | Wt 173.2 lb

## 2014-08-28 DIAGNOSIS — Z1239 Encounter for other screening for malignant neoplasm of breast: Secondary | ICD-10-CM

## 2014-08-28 HISTORY — DX: Diverticulitis of intestine, part unspecified, without perforation or abscess without bleeding: K57.92

## 2014-08-28 NOTE — Patient Instructions (Signed)
Educational materials on self breast awareness given. Explained to [employer]anie Burnett that BCCCP will cover Pap smears and co-testing every 5 years unless has a history of abnormal Pap smears and that next Pap smear will be due in January 2018. [facility] diagnostic mamm[facility] and possible ultrasound per recommendation. Appointment scheduled for Friday, August 29, 2014 at 0930. Patient aware of appointment and will be there. Shawna OrleansMelanie Farnworth verbalized understanding.

## 2014-08-28 NOTE — Progress Notes (Addendum)
[facility] due to recommending additional imaging of the left breast. Screening mamm[facility]. Patient complained of right breast pain x 1 year that comes and goes. Patient stated pain is sharp when it comes rating it at a 5 out of 10.  Pap Smear:  Pap smear not completed today. Last Pap smear [facility]/09/2011 at [facility] and normal with negative HPV. Per patient has no history of an abnormal Pap smear. Last Pap smear result is in EPIC  Physical exam: Breasts Breasts symmetrical. No skin abnormalities bilateral breasts. No nipple retraction bilateral breasts. No nipple discharge bilateral breasts. No lymphadenopathy. No lumps palpated bilateral breasts. Complaints of left outer breast tenderness on exam. [facility] breast diagnostic mammogram and possible ultrasound per recommendation. Appointment scheduled for Friday, August 29, 2014 at 0930.     Pelvic/Bimanual No Pap smear completed today since last Pap smear and HPV testing was completed 06/09/2011. Pap smear not indicated per BCCCP guidelines.

## 2014-08-29 ENCOUNTER — Other Ambulatory Visit (HOSPITAL_COMMUNITY): Payer: Self-pay | Admitting: Obstetrics and Gynecology

## 2014-08-29 ENCOUNTER — Ambulatory Visit
Admission: RE | Admit: 2014-08-29 | Discharge: 2014-08-29 | Disposition: A | Discharge status: Home / Self Care | Payer: No Typology Code available for payment source | Source: Ambulatory Visit | Attending: Obstetrics and Gynecology | Admitting: Obstetrics and Gynecology

## 2014-08-29 DIAGNOSIS — R928 Other abnormal and inconclusive findings on diagnostic imaging of breast: Secondary | ICD-10-CM

## 2015-10-20 ENCOUNTER — Other Ambulatory Visit: Payer: Self-pay | Admitting: Nurse Practitioner

## 2015-10-20 ENCOUNTER — Other Ambulatory Visit (HOSPITAL_COMMUNITY)
Admission: RE | Admit: 2015-10-20 | Discharge: 2015-10-20 | Disposition: A | Discharge status: Home / Self Care | Payer: BLUE CROSS/BLUE SHIELD | Source: Ambulatory Visit | Attending: Nurse Practitioner | Admitting: Nurse Practitioner

## 2015-10-20 DIAGNOSIS — Z01419 Encounter for gynecological examination (general) (routine) without abnormal findings: Secondary | ICD-10-CM | POA: Diagnosis not present

## 2015-10-20 DIAGNOSIS — Z1151 Encounter for screening for human papillomavirus (HPV): Secondary | ICD-10-CM | POA: Diagnosis not present

## 2015-10-20 DIAGNOSIS — Z3202 Encounter for pregnancy test, result negative: Secondary | ICD-10-CM | POA: Diagnosis not present

## 2015-10-20 DIAGNOSIS — Z30433 Encounter for removal and reinsertion of intrauterine contraceptive device: Secondary | ICD-10-CM | POA: Diagnosis not present

## 2015-10-21 LAB — CYTOLOGY - PAP

## 2015-11-04 ENCOUNTER — Other Ambulatory Visit: Payer: Self-pay | Admitting: Nurse Practitioner

## 2015-11-04 DIAGNOSIS — Z1231 Encounter for screening mammogram for malignant neoplasm of breast: Secondary | ICD-10-CM

## 2015-11-09 ENCOUNTER — Ambulatory Visit
Admission: RE | Admit: 2015-11-09 | Discharge: 2015-11-09 | Disposition: A | Discharge status: Home / Self Care | Payer: BLUE CROSS/BLUE SHIELD | Source: Ambulatory Visit | Attending: Nurse Practitioner | Admitting: Nurse Practitioner

## 2015-11-09 DIAGNOSIS — Z1231 Encounter for screening mammogram for malignant neoplasm of breast: Secondary | ICD-10-CM | POA: Diagnosis not present

## 2015-12-01 DIAGNOSIS — Z309 Encounter for contraceptive management, unspecified: Secondary | ICD-10-CM | POA: Diagnosis not present

## 2015-12-01 DIAGNOSIS — Z3202 Encounter for pregnancy test, result negative: Secondary | ICD-10-CM | POA: Diagnosis not present

## 2015-12-11 ENCOUNTER — Emergency Department (HOSPITAL_COMMUNITY)
Admission: EM | Admit: 2015-12-11 | Discharge: 2015-12-11 | Disposition: A | Discharge status: Home / Self Care | Payer: BLUE CROSS/BLUE SHIELD | Attending: Emergency Medicine | Admitting: Emergency Medicine

## 2015-12-11 ENCOUNTER — Emergency Department (HOSPITAL_COMMUNITY): Payer: BLUE CROSS/BLUE SHIELD

## 2015-12-11 ENCOUNTER — Encounter (HOSPITAL_COMMUNITY): Payer: Self-pay | Admitting: *Deleted

## 2015-12-11 DIAGNOSIS — R1032 Left lower quadrant pain: Secondary | ICD-10-CM | POA: Diagnosis not present

## 2015-12-11 DIAGNOSIS — K5732 Diverticulitis of large intestine without perforation or abscess without bleeding: Secondary | ICD-10-CM | POA: Insufficient documentation

## 2015-12-11 LAB — URINALYSIS, ROUTINE W REFLEX MICROSCOPIC
BILIRUBIN URINE: NEGATIVE
Glucose, UA: NEGATIVE mg/dL
HGB URINE DIPSTICK: NEGATIVE
KETONES UR: NEGATIVE mg/dL
Leukocytes, UA: NEGATIVE
NITRITE: NEGATIVE
PROTEIN: NEGATIVE mg/dL
SPECIFIC GRAVITY, URINE: 1.024 (ref 1.005–1.030)
pH: 7.5 (ref 5.0–8.0)

## 2015-12-11 LAB — COMPREHENSIVE METABOLIC PANEL
ALBUMIN: 4 g/dL (ref 3.5–5.0)
ALK PHOS: 65 U/L (ref 38–126)
ALT: 15 U/L (ref 14–54)
ANION GAP: 7 (ref 5–15)
AST: 18 U/L (ref 15–41)
BILIRUBIN TOTAL: 0.8 mg/dL (ref 0.3–1.2)
BUN: 10 mg/dL (ref 6–20)
CALCIUM: 8.7 mg/dL — AB (ref 8.9–10.3)
CO2: 23 mmol/L (ref 22–32)
CREATININE: 0.85 mg/dL (ref 0.44–1.00)
Chloride: 106 mmol/L (ref 101–111)
GFR calc non Af Amer: >60 mL/min (ref >60)
GLUCOSE: 106 mg/dL — AB (ref 65–99)
Potassium: 4.1 mmol/L (ref 3.5–5.1)
Sodium: 136 mmol/L (ref 135–145)
TOTAL PROTEIN: 7.2 g/dL (ref 6.5–8.1)

## 2015-12-11 LAB — CBC
HCT: 41.5 % (ref 36.0–46.0)
HEMOGLOBIN: 14.1 g/dL (ref 12.0–15.0)
MCH: 30.9 pg (ref 26.0–34.0)
MCHC: 34 g/dL (ref 30.0–36.0)
MCV: 90.8 fL (ref 78.0–100.0)
PLATELETS: 291 10*3/uL (ref 150–400)
RBC: 4.57 MIL/uL (ref 3.87–5.11)
RDW: 12.1 % (ref 11.5–15.5)
WBC: 12.4 10*3/uL — ABNORMAL HIGH (ref 4.0–10.5)

## 2015-12-11 LAB — I-STAT BETA HCG BLOOD, ED (MC, WL, AP ONLY)

## 2015-12-11 LAB — LIPASE, BLOOD: Lipase: 25 U/L (ref 11–51)

## 2015-12-11 MED ORDER — METRONIDAZOLE IN NACL 5-0.79 MG/ML-% IV SOLN
500.0000 mg | Freq: Once | INTRAVENOUS | Status: AC
  Administered 2015-12-11: 500 mg via INTRAVENOUS
  Filled 2015-12-11: qty 100

## 2015-12-11 MED ORDER — OXYCODONE-ACETAMINOPHEN 5-325 MG PO TABS
2.0000 | ORAL_TABLET | Freq: Once | ORAL | Status: AC
Start: 2015-12-11 — End: 2015-12-11
  Administered 2015-12-11: 2 via ORAL
  Filled 2015-12-11: qty 2

## 2015-12-11 MED ORDER — SODIUM CHLORIDE 0.9 % IV BOLUS (SEPSIS)
1000.0000 mL | Freq: Once | INTRAVENOUS | Status: AC
  Administered 2015-12-11: 1000 mL via INTRAVENOUS

## 2015-12-11 MED ORDER — ONDANSETRON HCL 4 MG/2ML IJ SOLN
4.0000 mg | Freq: Once | INTRAMUSCULAR | Status: AC
  Administered 2015-12-11: 4 mg via INTRAVENOUS
  Filled 2015-12-11: qty 2

## 2015-12-11 MED ORDER — OXYCODONE-ACETAMINOPHEN 5-325 MG PO TABS: 1.0000 | ORAL_TABLET | ORAL | 0 refills | Status: DC | PRN

## 2015-12-11 MED ORDER — ONDANSETRON HCL 4 MG PO TABS: 4.0000 mg | ORAL_TABLET | Freq: Three times a day (TID) | ORAL | 0 refills | Status: DC | PRN

## 2015-12-11 MED ORDER — CIPROFLOXACIN IN D5W 400 MG/200ML IV SOLN
400.0000 mg | Freq: Once | INTRAVENOUS | Status: AC
  Administered 2015-12-11: 400 mg via INTRAVENOUS
  Filled 2015-12-11: qty 200

## 2015-12-11 MED ORDER — IOPAMIDOL (ISOVUE-300) INJECTION 61%
INTRAVENOUS | Status: AC
  Administered 2015-12-11: 100 mL
  Filled 2015-12-11: qty 100

## 2015-12-11 MED ORDER — METRONIDAZOLE 500 MG PO TABS: 500.0000 mg | ORAL_TABLET | Freq: Three times a day (TID) | ORAL | 0 refills | Status: DC

## 2015-12-11 MED ORDER — CIPROFLOXACIN HCL 500 MG PO TABS: 500.0000 mg | ORAL_TABLET | Freq: Two times a day (BID) | ORAL | 0 refills | Status: DC

## 2015-12-11 MED ORDER — FENTANYL CITRATE (PF) 100 MCG/2ML IJ SOLN
50.0000 ug | Freq: Once | INTRAMUSCULAR | Status: AC
  Administered 2015-12-11: 100 ug via INTRAVENOUS
  Filled 2015-12-11: qty 2

## 2015-12-11 MED ORDER — DOCUSATE SODIUM 100 MG PO CAPS: 100.0000 mg | ORAL_CAPSULE | Freq: Two times a day (BID) | ORAL | 0 refills | Status: DC

## 2015-12-11 NOTE — ED Notes (Signed)
Patient transported to CT 

## 2015-12-11 NOTE — ED Notes (Signed)
Pt at CT

## 2015-12-11 NOTE — ED Provider Notes (Signed)
[facility]-EM[facility]ENCY DEPT Provider Note   CSN: [id number] Arrival date & time: 12/11/15  4098  First Provider Contact:  First MD Initiated Contact with Patient 12/11/15 [phone]        History   Chief Complaint Chief Complaint  Patient presents with  . Abdominal Pain    HPI Donna [name] is a 50 y.o. female.  The history is provided by the patient and medical records.  Abdominal Pain   This is a recurrent problem. The current episode started yesterday. The problem occurs constantly. The problem has been gradually worsening. The pain is located in the LLQ. The quality of the pain is aching, cramping and throbbing. The pain is at a severity of 10/10. The pain is severe. Associated symptoms include nausea. Pertinent negatives include fever, diarrhea, vomiting, dysuria, frequency and headaches. Nothing aggravates the symptoms. The symptoms are relieved by certain positions and palpation. Past workup includes CT scan.    Past Medical History:  Diagnosis Date  . Allergy   . Diverticulitis     There are no active problems to display for this patient.   Past Surgical History:  Procedure Laterality Date  . ABDOMINAL ADHESION SURGERY      OB History    Gravida Para Term Preterm AB Living   2 2 2     2    SAB TAB Ectopic Multiple Live Births                   Home Medications    Prior to Admission medications   Medication Sig Start Date End Date Taking? Authorizing Provider  ibuprofen (ADVIL,MOTRIN) 200 MG tablet Take 200 mg by mouth every 6 (six) hours as needed.   Yes Historical Provider, MD  levonorgestrel (MIRENA) 20 MCG/24HR IUD 1 each by Intrauterine route once.   Yes Historical Provider, MD  ciprofloxacin (CIPRO) 500 MG tablet Take 1 tablet (500 mg total) by mouth 2 (two) times daily. 12/11/15   Shaune Pollack, MD  docusate sodium (COLACE) 100 MG capsule Take 1 capsule (100 mg total) by mouth every 12 (twelve) hours. 12/11/15   Shaune Pollack, MD  metroNIDAZOLE (FLAGYL) 500 MG  tablet Take 1 tablet (500 mg total) by mouth 3 (three) times daily. 12/11/15   Shaune Pollack, MD  ondansetron (ZOFRAN) 4 MG tablet Take 1 tablet (4 mg total) by mouth every 8 (eight) hours as needed for nausea or vomiting. 12/11/15   Shaune Pollack, MD  oxyCODONE-acetaminophen (PERCOCET/ROXICET) 5-325 MG tablet Take 1-2 tablets by mouth every 4 (four) hours as needed for moderate pain or severe pain (Take 1 tablet for moderate pain, 2 tablets for severe pain). 12/11/15   Shaune Pollack, MD    Family History Family History  Problem Relation Age of Onset  . Heart disease Father   . Hypertension Father   . Stroke Father   . Breast cancer Maternal Aunt   . Breast cancer Maternal Grandmother     Social History Social History  Substance Use Topics  . Smoking status: Never Smoker  . Smokeless tobacco: Never Used  . Alcohol use Yes     Comment: occassionally     Allergies   Codeine; Levaquin [levofloxacin in d5w]; Omnipred [prednisolone acetate]; Sulfa antibiotics; and Tequin [gatifloxacin]   Review of Systems Review of Systems  Constitutional: Positive for fatigue. Negative for chills and fever.  HENT: Negative for congestion and rhinorrhea.   Eyes: Negative for visual disturbance.  Respiratory: Negative for cough, shortness of breath and wheezing.  Cardiovascular: Negative for chest pain and leg swelling.  Gastrointestinal: Positive for abdominal pain and nausea. Negative for blood in stool, diarrhea and vomiting.  Genitourinary: Negative for dysuria, flank pain and frequency.  Musculoskeletal: Negative for neck pain and neck stiffness.  Skin: Negative for rash and wound.  Allergic/Immunologic: Negative for immunocompromised state.  Neurological: Negative for syncope, weakness and headaches.     Physical Exam Updated Vital Signs BP 123/76 (BP Location: Right Arm)   Pulse 89   Temp 98.7 F (37.1 C) (Oral)   Resp 16   Ht 5' 3.5" (1.613 m)   Wt 176 lb 2 oz (79.9 kg)   SpO2  100%   BMI 30.71 kg/m   Physical Exam  Constitutional: She is oriented to person, place, and time. She appears well-developed and well-nourished.  Non-toxic appearance. No distress.  HENT:  Head: Normocephalic and atraumatic.  Mouth/Throat: Oropharynx is clear and moist.  Eyes: Conjunctivae are normal. Pupils are equal, round, and reactive to light.  Neck: Neck supple.  Cardiovascular: Normal rate, regular rhythm and normal heart sounds.  Exam reveals no friction rub.   No murmur heard. Pulmonary/Chest: Effort normal and breath sounds normal. No respiratory distress. She has no wheezes. She has no rales.  Abdominal: Normal appearance. She exhibits no distension. There is tenderness in the left lower quadrant. There is guarding. There is no rigidity, no rebound, no tenderness at McBurney's point and negative Murphy's sign.  Musculoskeletal: She exhibits no edema.  Neurological: She is alert and oriented to person, place, and time. She exhibits normal muscle tone.  Skin: Skin is warm. Capillary refill takes less than 2 seconds.  Nursing note and vitals reviewed.    ED Treatments / Results  Labs (all labs ordered are listed, but only abnormal results are displayed) Labs Reviewed  COMPREHENSIVE METABOLIC PANEL - Abnormal; Notable for the following:       Result Value   Glucose, Bld 106 (*)    Calcium 8.7 (*)    All other components within normal limits  CBC - Abnormal; Notable for the following:    WBC 12.4 (*)    All other components within normal limits  LIPASE, BLOOD  URINALYSIS, ROUTINE W REFLEX MICROSCOPIC (NOT AT [facility])  I-STAT BETA HCG BLOOD, ED ([facility], [facility], [facility] ONLY)    EKG  EKG Interpretation None       Radiology Ct Abdomen Pelvis W Contrast  Result Date: 12/11/2015 CLINICAL DATA:  Left lower quadrant pain and distension, initial encounter EXAM: CT ABDOMEN AND PELVIS WITH CONTRAST TECHNIQUE: Multidetector CT imaging of the abdomen and pelvis was performed using the  standard protocol following bolus administration of intravenous contrast. CONTRAST:  ISOVUE-300 IOPAMIDOL (ISOVUE-300) INJECTION 61% COMPARISON:  09/23/2013 FINDINGS: Lower chest:  No acute findings. Hepatobiliary: No masses or other significant abnormality. Pancreas: No mass, inflammatory changes, or other significant abnormality. Spleen: Within normal limits in size and appearance. Adrenals/Urinary Tract: No masses identified. No evidence of hydronephrosis. The bladder is well distended. Stomach/Bowel: Diverticulitis in the sigmoid colon is noted similar to that seen on the prior exam. No abscess or perforation is identified. The appendix is within normal limits. Vascular/Lymphatic: No pathologically enlarged lymph nodes. No evidence of abdominal aortic aneurysm. Reproductive: An IUD is noted within the uterus. Small ovarian cysts are noted bilaterally. Other: None. Musculoskeletal: Mild degenerative changes of the sacroiliac joint are noted. IMPRESSION: Sigmoid diverticulitis without evidence of perforation or abscess formation. Electronically Signed   By: Eulah Pont.D.  On: 12/11/2015 11:30    Procedures Procedures (including critical care time)  Medications Ordered in ED Medications  sodium chloride 0.9 % bolus 1,000 mL (0 mLs Intravenous Stopped 12/11/15 1115)  ondansetron (ZOFRAN) injection 4 mg (4 mg Intravenous Given 12/11/15 1015)  fentaNYL (SUBLIMAZE) injection 50 mcg (100 mcg Intravenous Given 12/11/15 1015)  iopamidol (ISOVUE-300) 61 % injection (100 mLs  Contrast Given 12/11/15 1111)  oxyCODONE-acetaminophen (PERCOCET/ROXICET) 5-325 MG per tablet 2 tablet (2 tablets Oral Given 12/11/15 1157)  ciprofloxacin (CIPRO) IVPB 400 mg (0 mg Intravenous Stopped 12/11/15 1321)  metroNIDAZOLE (FLAGYL) IVPB 500 mg (0 mg Intravenous Stopped 12/11/15 1321)  sodium chloride 0.9 % bolus 1,000 mL (0 mLs Intravenous Stopped 12/11/15 1438)     Initial Impression / Assessment and Plan / ED Course  I have  reviewed the triage vital signs and the nursing notes.  Pertinent labs & imaging results that were available during my care of the patient were reviewed by me and considered in my medical decision making (see chart for details).  Clinical Course   50 yo F with PMHx of recurrent diverticulitis who presents with severe LLQ pain. On arrival, pt tachycardic but otherwise HDS. Lab work reviewed as above - CBC shows leukocytosis, likely reactive, and labs are otherwise unremarkable. CT scan shows uncomplicated diverticulitis. 2L NS, IV ABX given here with improvement in symptoms. Pain controlled with PO analgesia and pt ambulatory without difficulty, tolerating PO without difficulty. Discussed management with pt and friend in detail. Given otherwise well appearance, stable vitals, will d/c with outpt cipro/flagyl (pt states this has worked well in past), pain control, bowel regimen, and diverticulitis diet. Pt in agreement. Return precautions given in detail.  Final Clinical Impressions(s) / ED Diagnoses   Final diagnoses:  Diverticulitis of large intestine without perforation or abscess without bleeding    New Prescriptions Discharge Medication List as of 12/11/2015  1:49 PM    START taking these medications   Details  docusate sodium (COLACE) 100 MG capsule Take 1 capsule (100 mg total) by mouth every 12 (twelve) hours., Starting Fri 12/11/2015, Print    ondansetron (ZOFRAN) 4 MG tablet Take 1 tablet (4 mg total) by mouth every 8 (eight) hours as needed for nausea or vomiting., Starting Fri 12/11/2015, Print    oxyCODONE-acetaminophen (PERCOCET/ROXICET) 5-325 MG tablet Take 1-2 tablets by mouth every 4 (four) hours as needed for moderate pain or severe pain (Take 1 tablet for moderate pain, 2 tablets for severe pain)., Starting Fri 12/11/2015, Print         Shaune Pollack, MD 12/11/15 1910

## 2015-12-15 DIAGNOSIS — R1032 Left lower quadrant pain: Secondary | ICD-10-CM | POA: Diagnosis not present

## 2015-12-15 DIAGNOSIS — K5792 Diverticulitis of intestine, part unspecified, without perforation or abscess without bleeding: Secondary | ICD-10-CM | POA: Diagnosis not present

## 2016-03-15 ENCOUNTER — Encounter: Payer: Self-pay | Admitting: Family Medicine

## 2016-03-15 DIAGNOSIS — K635 Polyp of colon: Secondary | ICD-10-CM | POA: Diagnosis not present

## 2016-03-15 DIAGNOSIS — R1032 Left lower quadrant pain: Secondary | ICD-10-CM | POA: Diagnosis not present

## 2016-03-15 DIAGNOSIS — K640 First degree hemorrhoids: Secondary | ICD-10-CM | POA: Diagnosis not present

## 2016-03-15 DIAGNOSIS — K5730 Diverticulosis of large intestine without perforation or abscess without bleeding: Secondary | ICD-10-CM | POA: Diagnosis not present

## 2016-03-15 DIAGNOSIS — K5732 Diverticulitis of large intestine without perforation or abscess without bleeding: Secondary | ICD-10-CM | POA: Diagnosis not present

## 2016-03-17 DIAGNOSIS — K635 Polyp of colon: Secondary | ICD-10-CM | POA: Diagnosis not present

## 2016-03-23 DIAGNOSIS — R140 Abdominal distension (gaseous): Secondary | ICD-10-CM | POA: Diagnosis not present

## 2016-04-06 ENCOUNTER — Encounter: Payer: BLUE CROSS/BLUE SHIELD | Attending: Gastroenterology | Admitting: Dietician

## 2016-04-06 DIAGNOSIS — R140 Abdominal distension (gaseous): Secondary | ICD-10-CM | POA: Diagnosis not present

## 2016-04-06 DIAGNOSIS — Z713 Dietary counseling and surveillance: Secondary | ICD-10-CM | POA: Insufficient documentation

## 2016-04-06 DIAGNOSIS — K5792 Diverticulitis of intestine, part unspecified, without perforation or abscess without bleeding: Secondary | ICD-10-CM | POA: Diagnosis not present

## 2016-04-06 DIAGNOSIS — K5730 Diverticulosis of large intestine without perforation or abscess without bleeding: Secondary | ICD-10-CM

## 2016-04-06 NOTE — Progress Notes (Signed)
Medical Nutrition Therapy:  Appt start time: 0955 end time:  1055.   Assessment:  Primary concerns today: Donna [name] is here today since she does not want to go back in the hospital again for diverticulitis. Would like to also avoid surgery. Has large pockets in intestines and trying to avoid seeds and nuts. Currently follows a high fiber diet. Had 3 bouts 2 years ago and was treated with antibiotics (cipro and flagyl). Had another bout in August. Had colonoscopy in a few weeks ago and constipation is better since then. Will follow liquid diet and low fiber when symptoms come on.   Trying to eat whole wheat bread and raisin bran. Tried taking probiotics which are bloating her. Was tested for celiac disease which was negative.  Foods that cause her pain on left side: symptoms: raw vegetables (except salads), beans, corn  Foods that cause her heartburn: banana and peanut butter   Foods that cause constipation: cheese, soda, sweets  Foods that cause bloating: probiotic supplements, Miralax a little, beer    Afraid to eat fruit, peanuts,vegetables, and almonds.  Has a lot constipation. Does not not eat a regularly scheduled diet. Feels like she does not drink enough fluid. Olive helps her to be regular. Has reflux/food regurgiation and takes omeprazole when she has symptoms. Having more reflux lately.   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 Lives with husband and 2 children.  Does not feel hungry in the morning. Sometimes skips meal and or does not eat all day.   Preferred Learning Style:   No preference indicated   Learning Readiness:   Ready  MEDICATIONS: see list    DIETARY INTAKE:  Usual eating pattern includes 1-3 meals and 0-1 snacks per day.  Avoided foods include: fatty foods, plain water    24-hr recall:  B (11 AM): none or oatmeal, raisin brain, rice chex or grits or eggs and bacon  Snk ( AM): none  L (2-3 PM): none or yogurt or cottage cheese or salad with egg salad or  sandwich on day off Snk ( PM): none D ( PM): none or grilled chicken, chicken chili, spaghetti, steak and potatoes and salad Snk ( PM): none or ginger snap cookies  Beverages: around 32 oz water with flavoring, milk sometimes, juice diluted in water, coffee with cream and sugar   Usual physical activity: walks dog each day, has active job  Estimated energy needs: 1600 calories 180 g carbohydrates 120 g protein 44 g fat  Progress Towards Goal(s):  In progress.   Nutritional Diagnosis:  [place]-1.4 Altered GI function As related to hx of diverticulosis, constipation, and GERD.  As evidenced by patient report of symptoms with diet recall .    Intervention:  Nutrition counseling provided. Plan: Work on getting 64 oz of fluid each day. (consider setting alarm in phone) Drink a protein shake in the morning if you are in a hurry. Block time in your schedule to eat 3 times per day and exercise. Plan to exercise 3 x week.  Eat all foods - nuts, fruits, tomatoes, and vegetables as long as you have no symptoms. Talk to your doctor about regurgitation and/or take omeprazole each day to avoid symptoms.   Teaching Method Utilized:  Visual Auditory Hands on  Supplements given during visit include:  [employer], lot [id number], exp 05/2017  [employer], lot #[id number], exp 01/23/2017  Barriers to learning/adherence to lifestyle change: busy schedule  Demonstrated degree of understanding via:  Teach Back  Monitoring/Evaluation:  Dietary intake, exercise, and body weight in 1 month(s).

## 2016-04-06 NOTE — Patient Instructions (Addendum)
Work on getting 64 oz of fluid each day. (consider setting alarm in phone) Drink a protein shake in the morning if you are in a hurry. Block time in your schedule to eat 3 times per day and exercise. Plan to exercise 3 x week.  Eat all foods - nuts, fruits, tomatoes, and vegetables as long as you have no symptoms. Talk to your doctor about regurgitation and/or take omeprazole each day to avoid symptoms.

## 2016-04-27 DIAGNOSIS — M170 Bilateral primary osteoarthritis of knee: Secondary | ICD-10-CM | POA: Diagnosis not present

## 2016-05-13 DIAGNOSIS — M170 Bilateral primary osteoarthritis of knee: Secondary | ICD-10-CM | POA: Diagnosis not present

## 2016-05-16 ENCOUNTER — Encounter: Payer: BLUE CROSS/BLUE SHIELD | Attending: Gastroenterology | Admitting: Dietician

## 2016-05-16 DIAGNOSIS — Z713 Dietary counseling and surveillance: Secondary | ICD-10-CM | POA: Insufficient documentation

## 2016-05-16 DIAGNOSIS — R140 Abdominal distension (gaseous): Secondary | ICD-10-CM | POA: Diagnosis not present

## 2016-05-16 DIAGNOSIS — K5792 Diverticulitis of intestine, part unspecified, without perforation or abscess without bleeding: Secondary | ICD-10-CM | POA: Insufficient documentation

## 2016-05-16 DIAGNOSIS — K5790 Diverticulosis of intestine, part unspecified, without perforation or abscess without bleeding: Secondary | ICD-10-CM

## 2016-05-16 NOTE — Progress Notes (Signed)
  Medical Nutrition Therapy:  Appt start time: 0855 end time:  930   Assessment:  Primary concerns today: Shawna OrleansMelanie returns for a follow up for GI issues. Started physical therapy this week for her knees. It's too early to tell if it's helping.   Has been trying to drink more water with a splash of juice which is good. Also trying to eat more regularly. No longer having constipation. Still tender when having some raw vegetables which is relieved when she has a bowel movement. Found that she cannot have chili 2 days in a row. Trying to eat a lot of salads. Not having bloating. Still having reflux and she takes omeprazole when she when she has symptoms.   Tried protein shakes and did not like them very much.   Having some fruit now which is going ok.   Preferred Learning Style:   No preference indicated   Learning Readiness:   Ready  MEDICATIONS: see list    DIETARY INTAKE:  Usual eating pattern includes 1-3 meals and 0-1 snacks per day.  Avoided foods include: fatty foods, plain water    24-hr recall:  B (11 AM): oatmeal, raisin brain, rice chex or grits or eggs and bacon  Snk ( AM): none  L (2-3 PM): yogurt or cottage cheese, applesauces or salad with egg salad or sandwich on day off Snk ( PM): none D ( PM): none or grilled chicken, chicken chili, spaghetti, steak and potatoes and salad Snk ( PM): none or ginger snap cookies  Beverages: around 32 oz water with flavoring, milk sometimes, juice diluted in water, coffee with cream and sugar   Usual physical activity: walks dog each day, has active job  Estimated energy needs: 1600 calories 180 g carbohydrates 120 g protein 44 g fat  Progress Towards Goal(s):  In progress.   Nutritional Diagnosis:  [place]-1.4 Altered GI function As related to hx of diverticulosis, constipation, and GERD.  As evidenced by patient report of symptoms with diet recall .    Intervention:  Nutrition counseling provided. Plan: Continue getting 64 oz  of fluid each day.  Consider blocking time in your schedule to eat 3 times per day and exercise. Continue eating something at least 3 x per day. Plan to exercise some each day for now. Talk to your doctor about regurgitation and/or take omeprazole each day to avoid symptoms.   Teaching Method Utilized:  Visual Auditory Hands on  Supplements given during visit include:  none  Barriers to learning/adherence to lifestyle change: busy schedule  Demonstrated degree of understanding via:  Teach Back   Monitoring/Evaluation:  Dietary intake, exercise, and body weight prn.

## 2016-05-16 NOTE — Patient Instructions (Addendum)
Continue getting 64 oz of fluid each day.  Consider blocking time in your schedule to eat 3 times per day and exercise. Continue eating something at least 3 x per day. Plan to exercise some each day for now. Talk to your doctor about regurgitation and/or take omeprazole each day to avoid symptoms.

## 2016-05-17 DIAGNOSIS — M170 Bilateral primary osteoarthritis of knee: Secondary | ICD-10-CM | POA: Diagnosis not present

## 2016-05-19 DIAGNOSIS — M170 Bilateral primary osteoarthritis of knee: Secondary | ICD-10-CM | POA: Diagnosis not present

## 2016-05-24 DIAGNOSIS — M170 Bilateral primary osteoarthritis of knee: Secondary | ICD-10-CM | POA: Diagnosis not present

## 2016-05-31 DIAGNOSIS — M170 Bilateral primary osteoarthritis of knee: Secondary | ICD-10-CM | POA: Diagnosis not present

## 2016-06-01 DIAGNOSIS — M170 Bilateral primary osteoarthritis of knee: Secondary | ICD-10-CM | POA: Diagnosis not present

## 2016-06-07 DIAGNOSIS — M170 Bilateral primary osteoarthritis of knee: Secondary | ICD-10-CM | POA: Diagnosis not present

## 2016-06-12 NOTE — Progress Notes (Signed)
Subjective:    Patient ID: Donna [name], female    DOB: October 09, 1965, 51 y.o.   MRN: [id number] Chief Complaint  Patient presents with  . Annual Exam    HPI  Donna [name] is a delightful 51 yo woman here today for her complete physical.   I saw met [name] almost 3 yrs prior when she presented with diverticulitis.  Primary Preventative Screenings: Cervical Cancer: Seen [facility] for well-woman care - last visit was 10/20/15 with NP Donna [name]. Has had 4 normal paps in Epic - last was 10/2015 with neg HR HPV so repeat in 2022. Family Planning/STI screening: IUD Breast Cancer: maternal aunt and maternal GM +. Normal mammogram 11/09/15 at [facility].  Her mammogram the year prior in March 2016 showed a possible left breast mass so she proceeded with a further diagnostic mammogram and ultrasound of the left breast which revealed a tiny benign cyst in the 2 o'clock position 4 cm from the nipple with the greatest dimension of 3 mm so no additional evaluation was necessary. Colorectal Cancer: Colonoscopy by Dr. Wilford [name] at [facility] 03/2016 showed 1 benign polyp so repeat in 10 yrs Lung Cancer: No h/o tob use Bone Density: Cardiac: Father with CAD/CVA. Weight/blood sugar: Wanting to loose weight.  Has been trying to get in more high fiber. n OTC/vit/supp/herbal: vitamins cause palpitations and insomnia.  Diet/Exercise: Working with nutritionist. Avoids fatty foods. Trying to drink more water and eat more freq small meals. Is active at her job and walks her dog daily. Lifestyle: occ EtOH, has kids - 53 yo  Dentist/Optho: Immunizations:    Chronic Medical Conditions: Recurrent Diverticulitis with Chronic constipation: Follows with GI Dr. Wilford [name] and has had several visits recently with nutritionist Donna [name] GERD: prn omeprazole Knee arthralgias: started PT last mo 2x/wk, saw Dr. Theda [name] at [facility] who recommended a cortisone injection and she had a  severe reaction with entire leg swelling from toes to hips  - she couldn't see her toes for 2 days.  Knee became red and swollen, she had insomnia and migraine headaches.  She couldn't walk, work, take care of her children. She had to stay in the bed for 3d with ice constantly.  Chest tightness, shortness of break, face flushed.  She doesn't think she has ever had steroids prior.  She did get blisters on her palms, feet, severe headache after omnicef. But could have been omnipred. She can't take vitamins - very sensitive to all medicines. She never had knee pain but she had weakness but it didn't work - she had to hold on when going up and down stairs, keeping up with her kids, so  Doing biking.   GI doc told her not to take ibuprofen as it would thin the walls of her diverticuli but she can use topical knees.  Depression screen [facility] 2/9 06/13/2016 05/16/2016 04/06/2016  Decreased Interest 0 0 0  Down, Depressed, Hopeless 0 0 0  PHQ - 2 Score 0 0 0   Past Medical History:  Diagnosis Date  . Allergy   . Anxiety   . Diverticulitis   . GERD (gastroesophageal reflux disease)    Past Surgical History:  Procedure Laterality Date  . ABDOMINAL ADHESION SURGERY     Current Outpatient Prescriptions on File Prior to Visit  Medication Sig Dispense Refill  . ibuprofen (ADVIL,MOTRIN) 200 MG tablet Take 200 mg by mouth every 6 (six) hours as needed.    Marland Kitchen levonorgestrel (MIRENA)  20 MCG/24HR IUD 1 each by Intrauterine route once.    Marland Kitchen oxyCODONE-acetaminophen (PERCOCET/ROXICET) 5-325 MG tablet Take 1-2 tablets by mouth every 4 (four) hours as needed for moderate pain or severe pain (Take 1 tablet for moderate pain, 2 tablets for severe pain). 20 tablet 0   No current facility-administered medications on file prior to visit.    Allergies  Allergen Reactions  . Codeine Hives  . Cortisone Swelling  . Levaquin [Levofloxacin In D5w] Other (See Comments)    Blisters on hands  . Omnipred [Prednisolone  Acetate] Other (See Comments)    Migraine, emesis  . Sulfa Antibiotics Nausea And Vomiting  . Tequin [Gatifloxacin] Other (See Comments)    Blisters on hands   Family History  Problem Relation Age of Onset  . Heart disease Father   . Hypertension Father   . Stroke Father   . Breast cancer Maternal Aunt   . Breast cancer Maternal Grandmother    Social History   Social History  . Marital status: Divorced    Spouse name: N/A  . Number of children: N/A  . Years of education: N/A   Social History Main Topics  . Smoking status: Never Smoker  . Smokeless tobacco: Never Used  . Alcohol use Yes     Comment: occassionally  . Drug use: No  . Sexual activity: Yes    Birth control/ protection: IUD   Other Topics Concern  . None   Social History Narrative  . None    Review of Systems  All other systems reviewed and are negative. see hpi     Objective:   Physical Exam  Constitutional: She is oriented to person, place, and time. She appears well-developed and well-nourished. No distress.  HENT:  Head: Normocephalic and atraumatic.  Right Ear: Tympanic membrane, external ear and ear canal normal.  Left Ear: Tympanic membrane, external ear and ear canal normal.  Nose: Nose normal. No mucosal edema or rhinorrhea.  Mouth/Throat: Uvula is midline, oropharynx is clear and moist and mucous membranes are normal. No posterior oropharyngeal erythema.  Eyes: Conjunctivae and EOM are normal. Pupils are equal, round, and reactive to light. Right eye exhibits no discharge. Left eye exhibits no discharge. No scleral icterus.  Neck: Normal range of motion. Neck supple. No thyromegaly present.  Cardiovascular: Normal rate, regular rhythm, normal heart sounds and intact distal pulses.   Pulmonary/Chest: Effort normal and breath sounds normal. No respiratory distress.  Abdominal: Soft. Bowel sounds are normal. There is no tenderness.  Genitourinary: No breast swelling, tenderness, discharge or  bleeding.  Musculoskeletal: She exhibits no edema.  Lymphadenopathy:    She has no cervical adenopathy.  Neurological: She is alert and oriented to person, place, and time. She has normal reflexes.  Skin: Skin is warm and dry. She is not diaphoretic. No erythema.  Psychiatric: She has a normal mood and affect. Her behavior is normal.   BP (!) 136/96 (BP Location: Right Arm, Patient Position: Sitting, Cuff Size: Small)   Pulse 76   Temp 98.6 F (37 C) (Oral)   Resp 18   Ht 5' 3.5" (1.613 m)   Wt 173 lb (78.5 kg)   SpO2 97%   BMI 30.16 kg/m     Assessment & Plan:  hypocalcermia - vit D - canceled as she can't take vitamins and already spends to much time in the sun hiv - ordered Tdap, flu? 1. Annual physical exam   2. Screening for cardiovascular, respiratory, and genitourinary diseases  3. Screening for deficiency anemia   4. Screening for thyroid disorder   5. Routine screening for STI (sexually transmitted infection)   6. Arthralgia of right knee   7. Class 1 obesity due to excess calories without serious comorbidity with body mass index (BMI) of 30.0 to 30.9 in adult   8. Right flank pain, chronic   9. Sensitivity to medication, sequela     Orders Placed This Encounter  Procedures  . CBC  . Comprehensive metabolic panel    Order Specific Question:   Has the patient fasted?    Answer:   Yes  . TSH  . HIV antibody  . Lipid panel    Order Specific Question:   Has the patient fasted?    Answer:   Yes  . Sedimentation Rate  . C-reactive protein  . POCT urinalysis dipstick    Delman Cheadle, M.D.  Primary Care at [facility] [address] [place], [place] [address] [phone] phone [phone] fax  07/02/16 11:59 PM

## 2016-06-13 ENCOUNTER — Ambulatory Visit (INDEPENDENT_AMBULATORY_CARE_PROVIDER_SITE_OTHER): Payer: BLUE CROSS/BLUE SHIELD | Admitting: Family Medicine

## 2016-06-13 ENCOUNTER — Encounter: Payer: Self-pay | Admitting: Family Medicine

## 2016-06-13 VITALS — BP 140/90 | HR 76 | Temp 98.6°F | Resp 18 | Ht 63.5 in | Wt 173.0 lb

## 2016-06-13 DIAGNOSIS — R109 Unspecified abdominal pain: Secondary | ICD-10-CM

## 2016-06-13 DIAGNOSIS — Z136 Encounter for screening for cardiovascular disorders: Secondary | ICD-10-CM | POA: Diagnosis not present

## 2016-06-13 DIAGNOSIS — Z6830 Body mass index (BMI) 30.0-30.9, adult: Secondary | ICD-10-CM | POA: Diagnosis not present

## 2016-06-13 DIAGNOSIS — Z1389 Encounter for screening for other disorder: Secondary | ICD-10-CM | POA: Diagnosis not present

## 2016-06-13 DIAGNOSIS — Z130 Encounter for screening for diseases of the blood and blood-forming organs and certain disorders involving the immune mechanism: Secondary | ICD-10-CM | POA: Diagnosis not present

## 2016-06-13 DIAGNOSIS — E6609 Other obesity due to excess calories: Secondary | ICD-10-CM

## 2016-06-13 DIAGNOSIS — M25561 Pain in right knee: Secondary | ICD-10-CM | POA: Diagnosis not present

## 2016-06-13 DIAGNOSIS — G8929 Other chronic pain: Secondary | ICD-10-CM | POA: Diagnosis not present

## 2016-06-13 DIAGNOSIS — Z1383 Encounter for screening for respiratory disorder NEC: Secondary | ICD-10-CM | POA: Diagnosis not present

## 2016-06-13 DIAGNOSIS — Z1329 Encounter for screening for other suspected endocrine disorder: Secondary | ICD-10-CM

## 2016-06-13 DIAGNOSIS — Z0000 Encounter for general adult medical examination without abnormal findings: Secondary | ICD-10-CM

## 2016-06-13 DIAGNOSIS — Z113 Encounter for screening for infections with a predominantly sexual mode of transmission: Secondary | ICD-10-CM | POA: Diagnosis not present

## 2016-06-13 DIAGNOSIS — T7840XS Allergy, unspecified, sequela: Secondary | ICD-10-CM | POA: Diagnosis not present

## 2016-06-13 LAB — POCT URINALYSIS DIP (MANUAL ENTRY)
BILIRUBIN UA: NEGATIVE
Bilirubin, UA: NEGATIVE
Blood, UA: NEGATIVE
Glucose, UA: NEGATIVE
LEUKOCYTES UA: NEGATIVE
NITRITE UA: NEGATIVE
PH UA: 6
PROTEIN UA: NEGATIVE
Spec Grav, UA: 1.02
Urobilinogen, UA: 0.2

## 2016-06-13 NOTE — Patient Instructions (Addendum)
Consider trying accupuncture or myofascial release for your right flank knot - if you want to try through your insurance, please let me know.   Start biking for exercise - look into spin classes - great knee strengthening and to increase your metabolism.  Pilates are also a great way to increase muscle mass while not causing increased stress on your knees.  Make sure you are doing a high protein, low/no carb diet with small meals freq throughout the day - try to graze and avoid an actual big meal - use a small plate for the big meals.  If you continue to have challenges come back to see me.  There are no easy answers but there are a lot of different things we can try to figure out how your body is going to best respond. However, at the end of the day if you are not able to increase exercise then the diet stuff is just going to be miserable and frustrating.   IF you received an x-ray today, you will receive an invoice from [facility]. Please contact [facility] at [phone] with questions or concerns regarding your invoice.   IF you received labwork today, you will receive an invoice from [facility]. Please contact [facility] at [phone] with questions or concerns regarding your invoice.   Our billing staff will not be able to assist you with questions regarding bills from these companies.  You will be contacted with the lab results as soon as they are available. The fastest way to get your results is to activate your My Chart account. Instructions are located on the last page of this paperwork. If you have not heard from [place] regarding the results in 2 weeks, please contact this office.    Health Maintenance, Female Introduction Adopting a healthy lifestyle and getting preventive care can go a long way to promote health and wellness. Talk with your health care provider about what schedule of regular examinations is right for you. This is a good chance for you to check in with  your provider about disease prevention and staying healthy. In between checkups, there are plenty of things you can do on your own. Experts have done a lot of research about which lifestyle changes and preventive measures are most likely to keep you healthy. Ask your health care provider for more information. Weight and diet Eat a healthy diet  Be sure to include plenty of vegetables, fruits, low-fat dairy products, and lean protein.  Do not eat a lot of foods high in solid fats, added sugars, or salt.  Get regular exercise. This is one of the most important things you can do for your health.  Most adults should exercise for at least 150 minutes each week. The exercise should increase your heart rate and make you sweat (moderate-intensity exercise).  Most adults should also do strengthening exercises at least twice a week. This is in addition to the moderate-intensity exercise. Maintain a healthy weight  Body mass index (BMI) is a measurement that can be used to identify possible weight problems. It estimates body fat based on height and weight. Your health care provider can help determine your BMI and help you achieve or maintain a healthy weight.  For females 56 years of age and older:  A BMI below 18.5 is considered underweight.  A BMI of 18.5 to 24.9 is normal.  A BMI of 25 to 29.9 is considered overweight.  A BMI of 30 and above is considered obese. Watch levels  of cholesterol and blood lipids  You should start having your blood tested for lipids and cholesterol at 50 years of age, then have this test every 5 years.  You may need to have your cholesterol levels checked more often if:  Your lipid or cholesterol levels are high.  You are older than 50 years of age.  You are at high risk for heart disease. Cancer screening Lung Cancer  Lung cancer screening is recommended for adults 77-43 years old who are at high risk for lung cancer because of a history of smoking.  A  yearly low-dose CT scan of the lungs is recommended for people who:  Currently smoke.  Have quit within the past 15 years.  Have at least a 30-pack-year history of smoking. A pack year is smoking an average of one pack of cigarettes a day for 1 year.  Yearly screening should continue until it has been 15 years since you quit.  Yearly screening should stop if you develop a health problem that would prevent you from having lung cancer treatment. Breast Cancer  Practice breast self-awareness. This means understanding how your breasts normally appear and feel.  It also means doing regular breast self-exams. Let your health care provider know about any changes, no matter how small.  If you are in your 20s or 30s, you should have a clinical breast exam (CBE) by a health care provider every 1-3 years as part of a regular health exam.  If you are 62 or older, have a CBE every year. Also consider having a breast X-ray (mammogram) every year.  If you have a family history of breast cancer, talk to your health care provider about genetic screening.  If you are at high risk for breast cancer, talk to your health care provider about having an MRI and a mammogram every year.  Breast cancer gene (BRCA) assessment is recommended for women who have family members with BRCA-related cancers. BRCA-related cancers include:  Breast.  Ovarian.  Tubal.  Peritoneal cancers.  Results of the assessment will determine the need for genetic counseling and BRCA1 and BRCA2 testing. Cervical Cancer  Your health care provider may recommend that you be screened regularly for cancer of the pelvic organs (ovaries, uterus, and vagina). This screening involves a pelvic examination, including checking for microscopic changes to the surface of your cervix (Pap test). You may be encouraged to have this screening done every 3 years, beginning at age 54.  For women ages 12-65, health care providers may recommend pelvic  exams and Pap testing every 3 years, or they may recommend the Pap and pelvic exam, combined with testing for human papilloma virus (HPV), every 5 years. Some types of HPV increase your risk of cervical cancer. Testing for HPV may also be done on women of any age with unclear Pap test results.  Other health care providers may not recommend any screening for nonpregnant women who are considered low risk for pelvic cancer and who do not have symptoms. Ask your health care provider if a screening pelvic exam is right for you.  If you have had past treatment for cervical cancer or a condition that could lead to cancer, you need Pap tests and screening for cancer for at least 20 years after your treatment. If Pap tests have been discontinued, your risk factors (such as having a new sexual partner) need to be reassessed to determine if screening should resume. Some women have medical problems that increase the chance of getting  cervical cancer. In these cases, your health care provider may recommend more frequent screening and Pap tests. Colorectal Cancer  This type of cancer can be detected and often prevented.  Routine colorectal cancer screening usually begins at 50 years of age and continues through 50 years of age.  Your health care provider may recommend screening at an earlier age if you have risk factors for colon cancer.  Your health care provider may also recommend using home test kits to check for hidden blood in the stool.  A small camera at the end of a tube can be used to examine your colon directly (sigmoidoscopy or colonoscopy). This is done to check for the earliest forms of colorectal cancer.  Routine screening usually begins at age 27.  Direct examination of the colon should be repeated every 5-10 years through 50 years of age. However, you may need to be screened more often if early forms of precancerous polyps or small growths are found. Skin Cancer  Check your skin from head to  toe regularly.  Tell your health care provider about any new moles or changes in moles, especially if there is a change in a mole's shape or color.  Also tell your health care provider if you have a mole that is larger than the size of a pencil eraser.  Always use sunscreen. Apply sunscreen liberally and repeatedly throughout the day.  Protect yourself by wearing long sleeves, pants, a wide-brimmed hat, and sunglasses whenever you are outside. Heart disease, diabetes, and high blood pressure  High blood pressure causes heart disease and increases the risk of stroke. High blood pressure is more likely to develop in:  People who have blood pressure in the high end of the normal range (130-139/85-89 mm Hg).  People who are overweight or obese.  People who are African American.  If you are 3-23 years of age, have your blood pressure checked every 3-5 years. If you are 72 years of age or older, have your blood pressure checked every year. You should have your blood pressure measured twice-once when you are at a hospital or clinic, and once when you are not at a hospital or clinic. Record the average of the two measurements. To check your blood pressure when you are not at a hospital or clinic, you can use:  An automated blood pressure machine at a pharmacy.  A home blood pressure monitor.  If you are between 67 years and 63 years old, ask your health care provider if you should take aspirin to prevent strokes.  Have regular diabetes screenings. This involves taking a blood sample to check your fasting blood sugar level.  If you are at a normal weight and have a low risk for diabetes, have this test once every three years after 50 years of age.  If you are overweight and have a high risk for diabetes, consider being tested at a younger age or more often. Preventing infection Hepatitis B  If you have a higher risk for hepatitis B, you should be screened for this virus. You are considered  at high risk for hepatitis B if:  You were born in a country where hepatitis B is common. Ask your health care provider which countries are considered high risk.  Your parents were born in a high-risk country, and you have not been immunized against hepatitis B (hepatitis B vaccine).  You have HIV or AIDS.  You use needles to inject street drugs.  You live with someone who  has hepatitis B.  You have had sex with someone who has hepatitis B.  You get hemodialysis treatment.  You take certain medicines for conditions, including cancer, organ transplantation, and autoimmune conditions. Hepatitis C  Blood testing is recommended for:  Everyone born from [birth year] through 1964.  Anyone with known risk factors for hepatitis C. Sexually transmitted infections (STIs)  You should be screened for sexually transmitted infections (STIs) including gonorrhea and chlamydia if:  You are sexually active and are younger than 50 years of age.  You are older than 50 years of age and your health care provider tells you that you are at risk for this type of infection.  Your sexual activity has changed since you were last screened and you are at an increased risk for chlamydia or gonorrhea. Ask your health care provider if you are at risk.  If you do not have HIV, but are at risk, it may be recommended that you take a prescription medicine daily to prevent HIV infection. This is called pre-exposure prophylaxis (PrEP). You are considered at risk if:  You are sexually active and do not regularly use condoms or know the HIV status of your partner(s).  You take drugs by injection.  You are sexually active with a partner who has HIV. Talk with your health care provider about whether you are at high risk of being infected with HIV. If you choose to begin PrEP, you should first be tested for HIV. You should then be tested every 3 months for as long as you are taking PrEP. Pregnancy  If you are premenopausal  and you may become pregnant, ask your health care provider about preconception counseling.  If you may become pregnant, take 400 to 800 micrograms (mcg) of folic acid every day.  If you want to prevent pregnancy, talk to your health care provider about birth control (contraception). Osteoporosis and menopause  Osteoporosis is a disease in which the bones lose minerals and strength with aging. This can result in serious bone fractures. Your risk for osteoporosis can be identified using a bone density scan.  If you are 32 years of age or older, or if you are at risk for osteoporosis and fractures, ask your health care provider if you should be screened.  Ask your health care provider whether you should take a calcium or vitamin D supplement to lower your risk for osteoporosis.  Menopause may have certain physical symptoms and risks.  Hormone replacement therapy may reduce some of these symptoms and risks. Talk to your health care provider about whether hormone replacement therapy is right for you. Follow these instructions at home:  Schedule regular health, dental, and eye exams.  Stay current with your immunizations.  Do not use any tobacco products including cigarettes, chewing tobacco, or electronic cigarettes.  If you are pregnant, do not drink alcohol.  If you are breastfeeding, limit how much and how often you drink alcohol.  Limit alcohol intake to no more than 1 drink per day for nonpregnant women. One drink equals 12 ounces of beer, 5 ounces of wine, or 1 ounces of hard liquor.  Do not use street drugs.  Do not share needles.  Ask your health care provider for help if you need support or information about quitting drugs.  Tell your health care provider if you often feel depressed.  Tell your health care provider if you have ever been abused or do not feel safe at home. This information is not intended to replace  advice given to you by your health care provider. Make  sure you discuss any questions you have with your health care provider. Document Released: 11/08/2010 Document Revised: 10/01/2015 Document Reviewed: 01/27/2015  2017 Elsevier  Bone Health Introduction Bones protect organs, store calcium, and anchor muscles. Good health habits, such as eating nutritious foods and exercising regularly, are important for maintaining healthy bones. They can also help to prevent a condition that causes bones to lose density and become weak and brittle (osteoporosis). Why is bone mass important? Bone mass refers to the amount of bone tissue that you have. The higher your bone mass, the stronger your bones. An important step toward having healthy bones throughout life is to have strong and dense bones during childhood. A young adult who has a high bone mass is more likely to have a high bone mass later in life. Bone mass at its greatest it is called peak bone mass. A large decline in bone mass occurs in older adults. In women, it occurs about the time of menopause. During this time, it is important to practice good health habits, because if more bone is lost than what is replaced, the bones will become less healthy and more likely to break (fracture). If you find that you have a low bone mass, you may be able to prevent osteoporosis or further bone loss by changing your diet and lifestyle. How can I find out if my bone mass is low? Bone mass can be measured with an X-ray test that is called a bone mineral density (BMD) test. This test is recommended for all women who are age 71 or older. It may also be recommended for men who are age 52 or older, or for people who are more likely to develop osteoporosis due to:  Having bones that break easily.  Having a long-term disease that weakens bones, such as kidney disease or rheumatoid arthritis.  Having menopause earlier than normal.  Taking medicine that weakens bones, such as steroids, thyroid hormones, or hormone treatment  for breast cancer or prostate cancer.  Smoking.  Drinking three or more alcoholic drinks each day. What are the nutritional recommendations for healthy bones? To have healthy bones, you need to get enough of the right minerals and vitamins. Most nutrition experts recommend getting these nutrients from the foods that you eat. Nutritional recommendations vary from person to person. Ask your health care provider what is healthy for you. Here are some general guidelines. Calcium Recommendations  Calcium is the most important (essential) mineral for bone health. Most people can get enough calcium from their diet, but supplements may be recommended for people who are at risk for osteoporosis. Good sources of calcium include:  Dairy products, such as low-fat or nonfat milk, cheese, and yogurt.  Dark green leafy vegetables, such as bok choy and broccoli.  Calcium-fortified foods, such as orange juice, cereal, bread, soy beverages, and tofu products.  Nuts, such as almonds. Follow these recommended amounts for daily calcium intake:  Children, age 26?3: 700 mg.  Children, age 53?8: 1,000 mg.  Children, age 5?13: 1,300 mg.  Teens, age 84?18: 1,300 mg.  Adults, age 58?50: 1,000 mg.  Adults, age 83?70:  Men: 1,000 mg.  Women: 1,200 mg.  Adults, age 269 or older: 1,200 mg.  Pregnant and breastfeeding females:  Teens: 1,300 mg.  Adults: 1,000 mg. Vitamin D Recommendations  Vitamin D is the most essential vitamin for bone health. It helps the body to absorb calcium. Sunlight stimulates the  skin to make vitamin D, so be sure to get enough sunlight. If you live in a cold climate or you do not get outside often, your health care provider may recommend that you take vitamin D supplements. Good sources of vitamin D in your diet include:  Egg yolks.  Saltwater fish.  Milk and cereal fortified with vitamin D. Follow these recommended amounts for daily vitamin D intake:  Children and teens,  age 6?18: 4 international units.  Adults, age 73 or younger: 400-800 international units.  Adults, age 94 or older: 800-1,000 international units. Other Nutrients  Other nutrients for bone health include:  Phosphorus. This mineral is found in meat, poultry, dairy foods, nuts, and legumes. The recommended daily intake for adult men and adult women is 700 mg.  Magnesium. This mineral is found in seeds, nuts, dark green vegetables, and legumes. The recommended daily intake for adult men is 400?420 mg. For adult women, it is 310?320 mg.  Vitamin K. This vitamin is found in green leafy vegetables. The recommended daily intake is 120 mg for adult men and 90 mg for adult women. What type of physical activity is best for building and maintaining healthy bones? Weight-bearing and strength-building activities are important for building and maintaining peak bone mass. Weight-bearing activities cause muscles and bones to work against gravity. Strength-building activities increases muscle strength that supports bones. Weight-bearing and muscle-building activities include:  Walking and hiking.  Jogging and running.  Dancing.  Gym exercises.  Lifting weights.  Tennis and racquetball.  Climbing stairs.  Aerobics. Adults should get at least 30 minutes of moderate physical activity on most days. Children should get at least 60 minutes of moderate physical activity on most days. Ask your health care provide what type of exercise is best for you. Where can I find more information? For more information, check out the following websites:  [place]: [employer]  [employer]: http://www.niams.AnonymousEar.fr.asp This information is not intended to replace advice given to you by your health care provider. Make sure you discuss any questions you have with your health care provider. Document Released:  07/16/2003 Document Revised: 11/13/2015 Document Reviewed: 04/30/2014  2017 Elsevier

## 2016-06-14 DIAGNOSIS — M170 Bilateral primary osteoarthritis of knee: Secondary | ICD-10-CM | POA: Diagnosis not present

## 2016-06-14 LAB — LIPID PANEL
CHOLESTEROL TOTAL: 221 mg/dL — AB (ref 100–199)
Chol/HDL Ratio: 4.6 ratio units — ABNORMAL HIGH (ref 0.0–4.4)
HDL: 48 mg/dL (ref >39)
LDL CALC: 153 mg/dL — AB (ref 0–99)
Triglycerides: 100 mg/dL (ref 0–149)
VLDL CHOLESTEROL CAL: 20 mg/dL (ref 5–40)

## 2016-06-14 LAB — COMPREHENSIVE METABOLIC PANEL
ALT: 21 IU/L (ref 0–32)
AST: 18 IU/L (ref 0–40)
Albumin/Globulin Ratio: 1.6 (ref 1.2–2.2)
Albumin: 4.6 g/dL (ref 3.5–5.5)
Alkaline Phosphatase: 69 IU/L (ref 39–117)
BUN/Creatinine Ratio: 19 (ref 9–23)
BUN: 17 mg/dL (ref 6–24)
Bilirubin Total: 0.6 mg/dL (ref 0.0–1.2)
CO2: 20 mmol/L (ref 18–29)
Calcium: 9.2 mg/dL (ref 8.7–10.2)
Chloride: 101 mmol/L (ref 96–106)
Creatinine, Ser: 0.91 mg/dL (ref 0.57–1.00)
GFR calc Af Amer: 85 mL/min/{1.73_m2} (ref >59)
GFR calc non Af Amer: 74 mL/min/{1.73_m2} (ref >59)
Globulin, Total: 2.9 g/dL (ref 1.5–4.5)
Glucose: 98 mg/dL (ref 65–99)
Potassium: 4.5 mmol/L (ref 3.5–5.2)
Sodium: 138 mmol/L (ref 134–144)
Total Protein: 7.5 g/dL (ref 6.0–8.5)

## 2016-06-14 LAB — TSH: TSH: 1.17 u[IU]/mL (ref 0.450–4.500)

## 2016-06-14 LAB — CBC
HEMATOCRIT: 41.8 % (ref 34.0–46.6)
Hemoglobin: 14.7 g/dL (ref 11.1–15.9)
MCH: 31.7 pg (ref 26.6–33.0)
MCHC: 35.2 g/dL (ref 31.5–35.7)
MCV: 90 fL (ref 79–97)
Platelets: 340 10*3/uL (ref 150–379)
RBC: 4.63 x10E6/uL (ref 3.77–5.28)
RDW: 13.2 % (ref 12.3–15.4)
WBC: 7.7 10*3/uL (ref 3.4–10.8)

## 2016-06-14 LAB — HIV ANTIBODY (ROUTINE TESTING W REFLEX): HIV Screen 4th Generation wRfx: NONREACTIVE

## 2016-06-16 DIAGNOSIS — R1032 Left lower quadrant pain: Secondary | ICD-10-CM

## 2016-06-17 DIAGNOSIS — M170 Bilateral primary osteoarthritis of knee: Secondary | ICD-10-CM | POA: Diagnosis not present

## 2016-06-18 LAB — C-REACTIVE PROTEIN: CRP: 2 mg/L (ref 0.0–4.9)

## 2016-06-18 LAB — SEDIMENTATION RATE

## 2016-06-20 ENCOUNTER — Telehealth: Payer: Self-pay

## 2016-06-20 NOTE — Telephone Encounter (Signed)
PATIENT STATES SHE HAD HER COMPLETE PHYSICAL DONE WITH DR. SHAW A WEEK AGO. SHE WAS ABLE TO GET HER LAB RESULTS FROM MY-CHART, BUT SHE NEEDS THEM EXPLAINED TO HER. BEST PHONE [phone] (CELL) PHARMACY CHOICE IS [facility] ON [address]. MBC

## 2016-06-20 NOTE — Telephone Encounter (Signed)
Patient notified via My Chart

## 2016-06-21 DIAGNOSIS — M170 Bilateral primary osteoarthritis of knee: Secondary | ICD-10-CM | POA: Diagnosis not present

## 2016-06-24 DIAGNOSIS — M170 Bilateral primary osteoarthritis of knee: Secondary | ICD-10-CM | POA: Diagnosis not present

## 2016-07-01 DIAGNOSIS — M170 Bilateral primary osteoarthritis of knee: Secondary | ICD-10-CM | POA: Diagnosis not present

## 2016-07-06 DIAGNOSIS — M170 Bilateral primary osteoarthritis of knee: Secondary | ICD-10-CM | POA: Diagnosis not present

## 2016-07-11 DIAGNOSIS — M170 Bilateral primary osteoarthritis of knee: Secondary | ICD-10-CM | POA: Diagnosis not present

## 2016-07-26 DIAGNOSIS — M170 Bilateral primary osteoarthritis of knee: Secondary | ICD-10-CM | POA: Diagnosis not present

## 2016-07-28 ENCOUNTER — Encounter (INDEPENDENT_AMBULATORY_CARE_PROVIDER_SITE_OTHER): Payer: BLUE CROSS/BLUE SHIELD | Admitting: Family Medicine

## 2016-07-28 DIAGNOSIS — M170 Bilateral primary osteoarthritis of knee: Secondary | ICD-10-CM | POA: Diagnosis not present

## 2016-08-01 DIAGNOSIS — M170 Bilateral primary osteoarthritis of knee: Secondary | ICD-10-CM | POA: Diagnosis not present

## 2016-08-03 DIAGNOSIS — M170 Bilateral primary osteoarthritis of knee: Secondary | ICD-10-CM | POA: Diagnosis not present

## 2016-08-24 DIAGNOSIS — K5900 Constipation, unspecified: Secondary | ICD-10-CM | POA: Diagnosis not present

## 2016-08-24 DIAGNOSIS — R1032 Left lower quadrant pain: Secondary | ICD-10-CM | POA: Diagnosis not present

## 2016-08-24 DIAGNOSIS — K5792 Diverticulitis of intestine, part unspecified, without perforation or abscess without bleeding: Secondary | ICD-10-CM | POA: Diagnosis not present

## 2016-09-06 ENCOUNTER — Ambulatory Visit (INDEPENDENT_AMBULATORY_CARE_PROVIDER_SITE_OTHER): Payer: BLUE CROSS/BLUE SHIELD | Admitting: Family Medicine

## 2016-09-06 ENCOUNTER — Other Ambulatory Visit: Payer: Self-pay | Admitting: Gastroenterology

## 2016-09-06 ENCOUNTER — Ambulatory Visit
Admission: RE | Admit: 2016-09-06 | Discharge: 2016-09-06 | Disposition: A | Discharge status: Home / Self Care | Payer: BLUE CROSS/BLUE SHIELD | Source: Ambulatory Visit | Attending: Gastroenterology | Admitting: Gastroenterology

## 2016-09-06 DIAGNOSIS — R10814 Left lower quadrant abdominal tenderness: Secondary | ICD-10-CM

## 2016-09-06 DIAGNOSIS — K219 Gastro-esophageal reflux disease without esophagitis: Secondary | ICD-10-CM | POA: Diagnosis not present

## 2016-09-06 DIAGNOSIS — R1032 Left lower quadrant pain: Secondary | ICD-10-CM

## 2016-09-06 DIAGNOSIS — K5732 Diverticulitis of large intestine without perforation or abscess without bleeding: Secondary | ICD-10-CM

## 2016-09-06 DIAGNOSIS — R12 Heartburn: Secondary | ICD-10-CM | POA: Diagnosis not present

## 2016-09-06 MED ORDER — IOPAMIDOL (ISOVUE-300) INJECTION 61%
100.0000 mL | Freq: Once | INTRAVENOUS | Status: AC | PRN
  Administered 2016-09-06: 100 mL via INTRAVENOUS

## 2016-09-12 DIAGNOSIS — K2930 Chronic superficial gastritis without bleeding: Secondary | ICD-10-CM | POA: Diagnosis not present

## 2016-09-13 DIAGNOSIS — K2930 Chronic superficial gastritis without bleeding: Secondary | ICD-10-CM | POA: Diagnosis not present

## 2016-09-13 DIAGNOSIS — K3189 Other diseases of stomach and duodenum: Secondary | ICD-10-CM | POA: Diagnosis not present

## 2016-09-13 DIAGNOSIS — K921 Melena: Secondary | ICD-10-CM | POA: Diagnosis not present

## 2016-09-15 ENCOUNTER — Encounter: Payer: Self-pay | Admitting: Family Medicine

## 2016-09-15 ENCOUNTER — Ambulatory Visit (INDEPENDENT_AMBULATORY_CARE_PROVIDER_SITE_OTHER): Payer: BLUE CROSS/BLUE SHIELD | Admitting: Family Medicine

## 2016-09-15 VITALS — BP 146/98 | HR 74 | Temp 98.5°F | Resp 16 | Ht 63.5 in | Wt 172.4 lb

## 2016-09-15 DIAGNOSIS — R1032 Left lower quadrant pain: Secondary | ICD-10-CM

## 2016-09-15 DIAGNOSIS — R030 Elevated blood-pressure reading, without diagnosis of hypertension: Secondary | ICD-10-CM

## 2016-09-15 DIAGNOSIS — K660 Peritoneal adhesions (postprocedural) (postinfection): Secondary | ICD-10-CM

## 2016-09-15 MED ORDER — TRAMADOL HCL 50 MG PO TABS: 50.0000 mg | ORAL_TABLET | Freq: Three times a day (TID) | ORAL | 1 refills | Status: DC | PRN

## 2016-09-15 MED ORDER — HYDROCHLOROTHIAZIDE 25 MG PO TABS: 25.0000 mg | ORAL_TABLET | Freq: Every day | ORAL | 1 refills | Status: DC

## 2016-09-15 NOTE — Patient Instructions (Signed)
Managing Your Hypertension Hypertension is commonly called high blood pressure. This is when the force of your blood pressing against the walls of your arteries is too strong. Arteries are blood vessels that carry blood from your heart throughout your body. Hypertension forces the heart to work harder to pump blood, and may cause the arteries to become narrow or stiff. Having untreated or uncontrolled hypertension can cause heart attack, stroke, kidney disease, and other problems. What are blood pressure readings? A blood pressure reading consists of a higher number over a lower number. Ideally, your blood pressure should be below 120/80. The first ("top") number is called the systolic pressure. It is a measure of the pressure in your arteries as your heart beats. The second ("bottom") number is called the diastolic pressure. It is a measure of the pressure in your arteries as the heart relaxes. What does my blood pressure reading mean? Blood pressure is classified into four stages. Based on your blood pressure reading, your health care provider may use the following stages to determine what type of treatment you need, if any. Systolic pressure and diastolic pressure are measured in a unit called mm Hg. Normal   Systolic pressure: below 120.  Diastolic pressure: below 80. Elevated   Systolic pressure: 120-129.  Diastolic pressure: below 80. Hypertension stage 1     Diastolic pressure: 80-89. Hypertension stage 2   Systolic pressure: 140 or above.  Diastolic pressure: 90 or above. What health risks are associated with hypertension? Managing your hypertension is an important responsibility. Uncontrolled hypertension can lead to:  A heart attack.  A stroke.  A weakened blood vessel (aneurysm).  Heart failure.  Kidney damage.  Eye damage.  Metabolic syndrome.  Memory and concentration problems. What changes can I make to manage my hypertension? Eating and drinking   Eat a  diet that is high in fiber and potassium, and low in salt (sodium), added sugar, and fat. An example eating plan is called the DASH (Dietary Approaches to Stop Hypertension) diet. To eat this way:  Eat plenty of fresh fruits and vegetables. Try to fill half of your plate at each meal with fruits and vegetables.  Eat whole grains, such as whole wheat pasta, brown rice, or whole grain bread. Fill about one quarter of your plate with whole grains.  Eat low-fat diary products.  Avoid fatty cuts of meat, processed or cured meats, and poultry with skin. Fill about one quarter of your plate with lean proteins such as fish, chicken without skin, beans, eggs, and tofu.  Avoid premade and processed foods. These tend to be higher in sodium, added sugar, and fat.     Lifestyle   Work with your health care provider to maintain a healthy body weight, or to lose weight. Ask what an ideal weight is for you.  Get at least 30 minutes of exercise that causes your heart to beat faster (aerobic exercise) most days of the week. Activities may include walking, swimming, or biking.       Monitoring   Monitor your blood pressure at home as told by your health care provider. Your personal target blood pressure may vary depending on your medical conditions, your age, and other factors.  Have your blood pressure checked regularly, as often as told by your health care provider. Working with your health care provider   Review all the medicines you take with your health care provider because there may be side effects or interactions.  Talk with your health   care provider about your diet, exercise habits, and other lifestyle factors that may be contributing to hypertension.  Visit your health care provider regularly. Your health care provider can help you create and adjust your plan for managing hypertension. Will I need medicine to control my blood pressure? Your health care provider may prescribe medicine if  lifestyle changes are not enough to get your blood pressure under control, and if:  Your systolic blood pressure is 130 or higher.  Your diastolic blood pressure is 80 or higher. Take medicines only as told by your health care provider. Follow the directions carefully. Blood pressure medicines must be taken as prescribed. The medicine does not work as well when you skip doses. Skipping doses also puts you at risk for problems. Contact a health care provider if:  You think you are having a reaction to medicines you have taken.  You have repeated (recurrent) headaches.  You feel dizzy.  You have swelling in your ankles.  You have trouble with your vision. Get help right away if:  You develop a severe headache or confusion.  You have unusual weakness or numbness, or you feel faint.  You have severe pain in your chest or abdomen.  You vomit repeatedly.  You have trouble breathing. Summary  Hypertension is when the force of blood pumping through your arteries is too strong. If this condition is not controlled, it may put you at risk for serious complications.  Your personal target blood pressure may vary depending on your medical conditions, your age, and other factors. For most people, a normal blood pressure is less than 120/80.  Hypertension is managed by lifestyle changes, medicines, or both. Lifestyle changes include weight loss, eating a healthy, low-sodium diet, exercising more, and limiting alcohol. This information is not intended to replace advice given to you by your health care provider. Make sure you discuss any questions you have with your health care provider. Document Released: 01/18/2012 Document Revised: 03/23/2016 Document Reviewed: 03/23/2016 Elsevier Interactive Patient Education  2017 Elsevier Inc.  

## 2016-09-15 NOTE — Progress Notes (Signed)
Subjective:    Patient ID: Donna [name], female    DOB: 1965/12/01, 51 y.o.   MRN: [id number] Chief Complaint  Patient presents with  . Follow-up  . Hypertension    states it has been high; and possible blood work    HPI  Recurrent Diverticulitis with Chronic constipation: Follows with GI Dr. Charlott Rakes and has had several visits recently with nutritionist Karen Kitchens GERD: prn omeprazole Knee arthralgias: started PT last mo 2x/wk, saw Dr. [name] at [facility] who recommended a cortisone injection and she had a severe reaction with entire leg swelling from toes to hips  - she couldn't see her toes for 2 days.  Knee became red and swollen, she had insomnia and migraine headaches.  She couldn't walk, work, take care of her children. She had to stay in the bed for 3d with ice constantly.  Chest tightness, shortness of break, face flushed.  She doesn't think she has ever had steroids prior. She had a knot in her right flank which advised her to try acupuncture or myofascial release for.  She did get blisters on her palms, feet, severe headache after omnicef. But could have been omnipred. She can't take vitamins - very sensitive to all medicines. She never had knee pain but she had weakness but it didn't work - she had to hold on when going up and down stairs, keeping up with her kids, so  Doing biking. Which I encouraged her to continue to strengthen her knees.  Obesity advise high protein low carb with frequent small meals  GI doc told her not to take ibuprofen as it would thin the walls of her diverticuli but she can use topical knees.  She had an endoscopy yesterday - on miralax, florastore, omepraozle. Only eating soft foods/nothing - yogurst GOt ill 4/4 with ciprof and flagyl diverculi when she was in [place]. On it for 22d.  Had 10d left of her vacation and was on bread and water. Had colonosocpy in Nov with divertu8la so yest had an UGI  Had scar tissue -  [employer] - for adhesion they couldn't see and only found during diagnotic lap. BLoating and pain went away 12/2007 after explore lap and lysis of adhesions.  Still with recurrent diverticulitis every 6 months.   Ovarian cyst -  Has appt 6/5 with [facility] for repeat US.  BLoating is denbilitatign  Still with constant rightt flank pain unchaged - can touch where it hurts- PT told her to use tennis ball for pressure but she doesn't have time.  Her LLQ pain is where she has the diverticulitis - it was the same place as she gets it but slightly above and now in the same place but pain is different. Pain is very better.   Nutritionist wasn't helping so she has an appt with Dr. Orlene Plum - shge has an appt with her on Monday she think about getting on a plan. Husband to do. Frustrated by [employer] gain Dairy makes her stomach feel better right now.  Stomahc feel s like summer is on fire with gastritis.    116/78 last wk. Was 130s/90s prior. No urge to defecate so just feels pressure. Kid [employer]  Can't take nasids, thyelnol doesn't work Kids at [employer] at [employer] and high school  Past Medical History:  Diagnosis Date  . Allergy   . Anxiety   . Diverticulitis   . GERD (gastroesophageal reflux disease)    Past  Surgical History:  Procedure Laterality Date  . ABDOMINAL ADHESION SURGERY    . ovarian cysts     Current Outpatient Prescriptions on File Prior to Visit  Medication Sig Dispense Refill  . oxyCODONE-acetaminophen (PERCOCET/ROXICET) 5-325 MG tablet Take 1-2 tablets by mouth every 4 (four) hours as needed for moderate pain or severe pain (Take 1 tablet for moderate pain, 2 tablets for severe pain). 20 tablet 0   No current facility-administered medications on file prior to visit.    Allergies  Allergen Reactions  . Augmentin [Amoxicillin-Pot Clavulanate]     States it burns her inside   . Codeine Hives  . Cortisone Swelling  . Levaquin [Levofloxacin In D5w]  Other (See Comments)    Blisters on hands  . Omnipred [Prednisolone Acetate] Other (See Comments)    Migraine, emesis  . Sulfa Antibiotics Nausea And Vomiting  . Tequin [Gatifloxacin] Other (See Comments)    Blisters on hands   Family History  Problem Relation Age of Onset  . Heart disease Father   . Hypertension Father   . Stroke Father   . Breast cancer Maternal Aunt   . Breast cancer Maternal Grandmother    Social History   Social History  . Marital status: Divorced    Spouse name: N/A  . Number of children: N/A  . Years of education: N/A   Social History Main Topics  . Smoking status: Never Smoker  . Smokeless tobacco: Never Used  . Alcohol use Yes     Comment: occassionally  . Drug use: No  . Sexual activity: Yes    Birth control/ protection: IUD   Other Topics Concern  . None   Social History Narrative  . None    Review of Systems     Objective:   Physical Exam    BP (!) 147/91   Pulse 74   Temp 98.5 F (36.9 C) (Oral)   Resp 16   Ht 5' 3.5" (1.613 m)   Wt 172 lb 6.4 oz (78.2 kg)   SpO2 96%   BMI 30.06 kg/m      Assessment & Plan:   1. Blood pressure elevated without history of HTN   2. Abdominal pain, left lower quadrant   3. Lower abdominal adhesions     Orders Placed This Encounter  Procedures  . Ambulatory referral to General Surgery    Referral Priority:   Routine    Referral Type:   Surgical    Referral Reason:   Specialty Services Required    Requested Specialty:   General Surgery    Number of Visits Requested:   1  . Care order/instruction:    Scheduling Instructions:     Recheck BP    Meds ordered this encounter  Medications  . levonorgestrel (MIRENA) 20 MCG/24HR IUD    Sig: 1 each by Intrauterine route once.  . traMADol (ULTRAM) 50 MG tablet    Sig: Take 1 tablet (50 mg total) by mouth every 8 (eight) hours as needed.    Dispense:  30 tablet    Refill:  1  . hydrochlorothiazide (HYDRODIURIL) 25 MG tablet    Sig:  Take 1 tablet (25 mg total) by mouth daily.    Dispense:  30 tablet    Refill:  1   Over 40 min spent in face-to-face evaluation of and consultation with patient and coordination of care.  Over 50% of this time was spent counseling this patient.  Norberto Sorenson, M.D.  Primary Care  at [facility]  [place] [address] [place], [place] [address] [phone] phone [phone] fax  09/17/16 11:29 PM

## 2016-09-19 ENCOUNTER — Encounter (INDEPENDENT_AMBULATORY_CARE_PROVIDER_SITE_OTHER): Payer: Self-pay | Admitting: Family Medicine

## 2016-09-19 ENCOUNTER — Ambulatory Visit (INDEPENDENT_AMBULATORY_CARE_PROVIDER_SITE_OTHER): Payer: BLUE CROSS/BLUE SHIELD | Admitting: Family Medicine

## 2016-09-19 VITALS — BP 132/89 | HR 83 | Temp 97.5°F | Resp 12 | Ht 64.0 in | Wt 167.0 lb

## 2016-09-19 DIAGNOSIS — Z9189 Other specified personal risk factors, not elsewhere classified: Secondary | ICD-10-CM | POA: Diagnosis not present

## 2016-09-19 DIAGNOSIS — R0602 Shortness of breath: Secondary | ICD-10-CM

## 2016-09-19 DIAGNOSIS — E669 Obesity, unspecified: Secondary | ICD-10-CM | POA: Diagnosis not present

## 2016-09-19 DIAGNOSIS — K5790 Diverticulosis of intestine, part unspecified, without perforation or abscess without bleeding: Secondary | ICD-10-CM

## 2016-09-19 DIAGNOSIS — Z6830 Body mass index (BMI) 30.0-30.9, adult: Secondary | ICD-10-CM

## 2016-09-19 DIAGNOSIS — K2930 Chronic superficial gastritis without bleeding: Secondary | ICD-10-CM | POA: Diagnosis not present

## 2016-09-19 DIAGNOSIS — Z1389 Encounter for screening for other disorder: Secondary | ICD-10-CM

## 2016-09-19 DIAGNOSIS — R5383 Other fatigue: Secondary | ICD-10-CM

## 2016-09-19 DIAGNOSIS — Z0289 Encounter for other administrative examinations: Secondary | ICD-10-CM

## 2016-09-19 DIAGNOSIS — Z1331 Encounter for screening for depression: Secondary | ICD-10-CM

## 2016-09-19 NOTE — Progress Notes (Signed)
Office: [phone]  /  Fax: [phone]   HPI:   Chief Complaint: OBESITY  Donna [name] (MR# [id number]) is a 51 y.o. female who presents on 09/19/2016 for obesity evaluation and treatment. Current BMI is Body mass index is 28.67 kg/m.Donna [name] has struggled with obesity for years and has been unsuccessful in either losing weight or maintaining long term weight loss. Donna [name] attended our information session and states she is currently in the action stage of change and ready to dedicate time achieving and maintaining a healthier weight.  Donna [name] states her family eats meals together she thinks her family will eat healthier with  her her desired weight loss is 36 lbs she started gaining weight after babies her heaviest weight ever was 175 lbs. she has significant food cravings issues  she skips meals frequently she is frequently drinking liquids with calories she frequently makes poor food choices she has binge eating behaviors she struggles with emotional eating    Fatigue Donna [name] feels her energy is lower than it should be. This has worsened with weight gain and has not worsened recently. Donna [name] admits to daytime somnolence and  admits to waking up still tired. Patient is at risk for obstructive sleep apnea. Patent has a history of symptoms of daytime fatigue and morning fatigue. Patient generally gets 5 or 6 hours of sleep per night, and states they generally have restless sleep. Snoring is present. Apneic episodes are not present. Epworth Sleepiness Score is 12  Dyspnea on exertion Donna [name] notes increasing shortness of breath with exercising and seems to be worsening over time with weight gain. She notes getting out of breath sooner with activity than she used to. This has not gotten worse recently. Donna [name] denies orthopnea.  Diverticulosis Donna [name] has a diagnosis of diverticulosis with history of multiple diverticulitis episodes and abdominal adhesions with chronic left lower  quadrant abdominal pain.  Gastritis Donna [name] was recently diagnosed with gastritis. She is on Nexium daily and H Pylori culture is pending.  At risk for diabetes Donna [name] is at higher than average risk for developing diabetes due to her obesity. She currently denies polyuria or polydipsia.  Depression Screen Donna [name]'s Food and Mood (modified PHQ-9) score was  Depression screen PHQ 2/9 09/19/2016  Decreased Interest 3  Down, Depressed, Hopeless 1  PHQ - 2 Score 4  Altered sleeping 3  Tired, decreased energy 3  Change in appetite 1  Feeling bad or failure about yourself  0  Trouble concentrating 2  Moving slowly or fidgety/restless 1  PHQ-9 Score 14    ALLERGIES: Allergies  Allergen Reactions  . Epinephrine Other (See Comments)    Shakes like shes having a seizure  . Augmentin [Amoxicillin-Pot Clavulanate]     States it burns her inside   . Codeine Hives  . Cortisone Swelling  . Levaquin [Levofloxacin In D5w] Other (See Comments)    Blisters on hands  . Omnipred [Prednisolone Acetate] Other (See Comments)    Migraine, emesis  . Sulfa Antibiotics Nausea And Vomiting  . Tequin [Gatifloxacin] Other (See Comments)    Blisters on hands    MEDICATIONS: Current Outpatient Prescriptions on File Prior to Visit  Medication Sig Dispense Refill  . hydrochlorothiazide (HYDRODIURIL) 25 MG tablet Take 1 tablet (25 mg total) by mouth daily. 30 tablet 1  . levonorgestrel (MIRENA) 20 MCG/24HR IUD 1 each by Intrauterine route once.     No current facility-administered medications on file prior to visit.     PAST MEDICAL HISTORY: Past Medical  History:  Diagnosis Date  . Allergy   . Anxiety   . Constipation   . Depression   . Diverticulitis   . Elevated BP without diagnosis of hypertension   . GERD (gastroesophageal reflux disease)   . HTN (hypertension)   . IBS (irritable bowel syndrome)   . Night muscle spasms    back  . Osteoarthritis (arthritis due to wear and tear of  joints)    knees  . Ovarian cyst   . Pain in the abdomen    lower left quarter  . Palpitations   . SOB (shortness of breath) on exertion   . Swelling    feet and legs    PAST SURGICAL HISTORY: Past Surgical History:  Procedure Laterality Date  . ABDOMINAL ADHESION SURGERY    . acl replacement  2000  . ovarian cysts      SOCIAL HISTORY: Social History  Substance Use Topics  . Smoking status: Never Smoker  . Smokeless tobacco: Never Used  . Alcohol use Yes     Comment: occassionally    FAMILY HISTORY: Family History  Problem Relation Age of Onset  . Heart disease Father   . Hypertension Father   . Stroke Father   . Diabetes Father   . Hyperlipidemia Father   . Kidney disease Father   . Sleep apnea Father   . Obesity Father   . Breast cancer Maternal Aunt   . Breast cancer Maternal Grandmother   . Anxiety disorder Mother     ROS: Review of Systems  Constitutional: Positive for malaise/fatigue.       Breast Lumps (did 3-D ultrasound)  HENT: Positive for nosebleeds.   Eyes:       Wear Glasses or Contacts  Respiratory: Positive for shortness of breath (on exertion).   Cardiovascular: Positive for palpitations. Negative for orthopnea.  Gastrointestinal: Positive for abdominal pain (left lower quadrant), constipation and heartburn.  Genitourinary: Negative for frequency.  Musculoskeletal: Positive for back pain and joint pain.       Neck Stiffness  Neurological: Positive for weakness.  Endo/Heme/Allergies: Negative for polydipsia.       Hay Fever  Psychiatric/Behavioral: The patient has insomnia.        Stress    PHYSICAL EXAM: Blood pressure 132/89, pulse 83, temperature 97.5 F (36.4 C), temperature source Oral, resp. rate 12, height 5\' 4"  (1.626 m), weight 167 lb (75.8 kg), SpO2 98 %. Body mass index is 28.67 kg/m. Physical Exam  Constitutional: She is oriented to person, place, and time. She appears well-developed and well-nourished.  Cardiovascular:  Normal rate.   Pulmonary/Chest: Effort normal.  Musculoskeletal: Normal range of motion.  Neurological: She is oriented to person, place, and time.  Skin: Skin is warm and dry.  Vitals reviewed.   RECENT LABS AND TESTS: BMET    Component Value Date/Time   NA 138 06/13/2016 1153   K 4.5 06/13/2016 1153   CL 101 06/13/2016 1153   CO2 20 06/13/2016 1153   GLUCOSE 98 06/13/2016 1153   GLUCOSE 106 (H) 12/11/2015 0945   BUN 17 06/13/2016 1153   CREATININE 0.91 06/13/2016 1153   CALCIUM 9.2 06/13/2016 1153   GFRNONAA 74 06/13/2016 1153   GFRAA 85 06/13/2016 1153   No results found for: HGBA1C No results found for: INSULIN CBC    Component Value Date/Time   WBC 7.7 06/13/2016 1153   WBC 12.4 (H) 12/11/2015 0945   RBC 4.63 06/13/2016 1153   RBC 4.57 12/11/2015 0945  HGB 14.1 12/11/2015 0945   HCT 41.8 06/13/2016 1153   PLT 340 06/13/2016 1153   MCV 90 06/13/2016 1153   MCH 31.7 06/13/2016 1153   MCH 30.9 12/11/2015 0945   MCHC 35.2 06/13/2016 1153   MCHC 34.0 12/11/2015 0945   RDW 13.2 06/13/2016 1153   Iron/TIBC/Ferritin/ %Sat No results found for: IRON, TIBC, FERRITIN, IRONPCTSAT Lipid Panel     Component Value Date/Time   CHOL 221 (H) 06/13/2016 1153   TRIG 100 06/13/2016 1153   HDL 48 06/13/2016 1153   CHOLHDL 4.6 (H) 06/13/2016 1153   LDLCALC 153 (H) 06/13/2016 1153   Hepatic Function Panel     Component Value Date/Time   PROT 7.5 06/13/2016 1153   ALBUMIN 4.6 06/13/2016 1153   AST 18 06/13/2016 1153   ALT 21 06/13/2016 1153   ALKPHOS 69 06/13/2016 1153   BILITOT 0.6 06/13/2016 1153      Component Value Date/Time   TSH 1.170 06/13/2016 1153    ECG  shows NSR with a rate of 82 BPM INDIRECT CALORIMETER done today shows a VO2 of 268 and a REE of 1867.    ASSESSMENT AND PLAN: Other fatigue - Plan: EKG 12-Lead, Vitamin B12, CBC With Differential, Comprehensive metabolic panel, Folate, Hemoglobin A1c, Insulin, random, Lipid Panel With LDL/HDL Ratio,  T3, T4, free, TSH, VITAMIN D 25 Hydroxy (Vit-D Deficiency, Fractures)  Shortness of breath on exertion  Diverticulosis of intestine without bleeding, unspecified intestinal tract location - Plan: Comprehensive metabolic panel  Depression screening  At risk for diabetes mellitus - Plan: Hemoglobin A1c, Insulin, random  Class 1 obesity without serious comorbidity with body mass index (BMI) of 30.0 to 30.9 in adult, unspecified obesity type  PLAN:  Fatigue Daylee was informed that her fatigue may be related to obesity, depression or many other causes. Labs will be ordered, and in the meanwhile Dezra has agreed to work on diet, exercise and weight loss to help with fatigue. Proper sleep hygiene was discussed including the need for 7-8 hours of quality sleep each night. A sleep study was not ordered based on symptoms and Epworth score.  Dyspnea on exertion Donna [name]'s shortness of breath appears to be obesity related and exercise induced. She has agreed to work on weight loss and gradually increase exercise to treat her exercise induced shortness of breath. If Donna [name] follows our instructions and loses weight without improvement of her shortness of breath, we will plan to refer to pulmonology. We will monitor this condition regularly. Donna [name] agrees to this plan.  Diverticulosis Donna [name] was put on a low carbohydrate, moderate fiber and protein rich diet. She is to increase H2O to at least 80 ounces per day and take Miralax OTC 17 grams per day and she agreed to follow up with our clinic in 2 weeks.  Gastritis We will wait for culture results and follow. She agrees to no ETOH and no NSAID's and follow up with our clinic in 2 weeks.  Diabetes risk counselling Donna [name] was given extended (at least 30 minutes) diabetes prevention counseling today. She is 51 y.o. female and has risk factors for diabetes including obesity. We discussed intensive lifestyle modifications today with an emphasis on  weight loss as well as increasing exercise and decreasing simple carbohydrates in her diet.  Depression Screen Donna [name] had a moderately positive depression screening. Depression is commonly associated with obesity and often results in emotional eating behaviors. We will monitor this closely and work on CBT to help improve the non-hunger eating  patterns. Referral to Psychology may be required if no improvement is seen as she continues in our clinic.  Obesity Donna [name] is currently in the action stage of change and her goal is to continue with weight loss efforts She has agreed to follow the Category 2 plan Donna [name] has been instructed to work up to a goal of 150 minutes of combined cardio and strengthening exercise per week for weight loss and overall health benefits. We discussed the following Behavioral Modification Stratagies today: increasing H2O, decreasing simple carbohydrates , increasing lower sugar fruits and decrease eating out.  Donna [name] has agreed to follow up with our clinic in 2 weeks. She was informed of the importance of frequent follow up visits to maximize her success with intensive lifestyle modifications for her multiple health conditions. She was informed we would discuss her lab results at her next visit unless there is a critical issue that needs to be addressed sooner. Donna [name] agreed to keep her next visit at the agreed upon time to discuss these results.  I, Nevada Crane, am acting as scribe for Quillian Quince, MD  I have reviewed the above documentation for accuracy and completeness, and I agree with the above. -Quillian Quince, MD

## 2016-09-20 ENCOUNTER — Telehealth: Payer: Self-pay | Admitting: Family Medicine

## 2016-09-20 DIAGNOSIS — R1032 Left lower quadrant pain: Secondary | ICD-10-CM | POA: Diagnosis not present

## 2016-09-20 DIAGNOSIS — R140 Abdominal distension (gaseous): Secondary | ICD-10-CM | POA: Diagnosis not present

## 2016-09-20 LAB — CBC WITH DIFFERENTIAL
BASOS ABS: 0 10*3/uL (ref 0.0–0.2)
BASOS: 0 %
EOS (ABSOLUTE): 0 10*3/uL (ref 0.0–0.4)
EOS: 0 %
HEMATOCRIT: 42.3 % (ref 34.0–46.6)
HEMOGLOBIN: 14.4 g/dL (ref 11.1–15.9)
IMMATURE GRANS (ABS): 0 10*3/uL (ref 0.0–0.1)
Immature Granulocytes: 0 %
LYMPHS ABS: 1.5 10*3/uL (ref 0.7–3.1)
LYMPHS: 19 %
MCH: 31 pg (ref 26.6–33.0)
MCHC: 34 g/dL (ref 31.5–35.7)
MCV: 91 fL (ref 79–97)
Monocytes Absolute: 0.6 10*3/uL (ref 0.1–0.9)
Monocytes: 7 %
Neutrophils Absolute: 5.8 10*3/uL (ref 1.4–7.0)
Neutrophils: 74 %
RBC: 4.65 x10E6/uL (ref 3.77–5.28)
RDW: 13.2 % (ref 12.3–15.4)
WBC: 7.9 10*3/uL (ref 3.4–10.8)

## 2016-09-20 LAB — LIPID PANEL WITH LDL/HDL RATIO
Cholesterol, Total: 207 mg/dL — ABNORMAL HIGH (ref 100–199)
HDL: 41 mg/dL (ref >39)
LDL CALC: 138 mg/dL — AB (ref 0–99)
LDL/HDL RATIO: 3.4 ratio — AB (ref 0.0–3.2)
Triglycerides: 138 mg/dL (ref 0–149)
VLDL CHOLESTEROL CAL: 28 mg/dL (ref 5–40)

## 2016-09-20 LAB — COMPREHENSIVE METABOLIC PANEL
ALBUMIN: 4.6 g/dL (ref 3.5–5.5)
ALK PHOS: 69 IU/L (ref 39–117)
ALT: 22 IU/L (ref 0–32)
AST: 16 IU/L (ref 0–40)
Albumin/Globulin Ratio: 1.5 (ref 1.2–2.2)
BILIRUBIN TOTAL: 0.5 mg/dL (ref 0.0–1.2)
BUN / CREAT RATIO: 17 (ref 9–23)
BUN: 12 mg/dL (ref 6–24)
CO2: 27 mmol/L (ref 18–29)
Calcium: 9.3 mg/dL (ref 8.7–10.2)
Chloride: 98 mmol/L (ref 96–106)
Creatinine, Ser: 0.72 mg/dL (ref 0.57–1.00)
GFR calc Af Amer: 113 mL/min/{1.73_m2} (ref >59)
GFR calc non Af Amer: 98 mL/min/{1.73_m2} (ref >59)
GLOBULIN, TOTAL: 3 g/dL (ref 1.5–4.5)
GLUCOSE: 101 mg/dL — AB (ref 65–99)
Potassium: 3.9 mmol/L (ref 3.5–5.2)
SODIUM: 139 mmol/L (ref 134–144)
TOTAL PROTEIN: 7.6 g/dL (ref 6.0–8.5)

## 2016-09-20 LAB — INSULIN, RANDOM: INSULIN: 8.8 u[IU]/mL (ref 2.6–24.9)

## 2016-09-20 LAB — HEMOGLOBIN A1C
ESTIMATED AVERAGE GLUCOSE: 114 mg/dL
Hgb A1c MFr Bld: 5.6 % (ref 4.8–5.6)

## 2016-09-20 LAB — T4, FREE: FREE T4: 1.26 ng/dL (ref 0.82–1.77)

## 2016-09-20 LAB — FOLATE: Folate: 16 ng/mL (ref >3.0)

## 2016-09-20 LAB — T3: T3, Total: 135 ng/dL (ref 71–180)

## 2016-09-20 LAB — TSH: TSH: 1.12 u[IU]/mL (ref 0.450–4.500)

## 2016-09-20 LAB — VITAMIN B12: Vitamin B-12: 508 pg/mL (ref 232–1245)

## 2016-09-20 LAB — VITAMIN D 25 HYDROXY (VIT D DEFICIENCY, FRACTURES): Vit D, 25-Hydroxy: 32.8 ng/mL (ref 30.0–100.0)

## 2016-09-20 NOTE — Telephone Encounter (Signed)
Pt is checking on a referral to the general surgeon.  The OBGYN did not want to refer her since the pain is not associated with the ovarian cysts.  They want her to either go to the surgeon or the hospital.  Please advise [phone]

## 2016-09-20 NOTE — Telephone Encounter (Signed)
Tried calling pt back. [facility]. If pt would like to call them their number is [phone].

## 2016-09-21 NOTE — Telephone Encounter (Signed)
Pt called back to get phone number for referral. Pt wanted to let Dr. Clelia CroftShaw know that the blood pressure medicine has helped and her headaches have gotten much better. She said she doesn't feel as much pressure in her head now.

## 2016-09-22 NOTE — Telephone Encounter (Signed)
Great - thanks

## 2016-09-23 DIAGNOSIS — R1032 Left lower quadrant pain: Secondary | ICD-10-CM | POA: Diagnosis not present

## 2016-09-26 ENCOUNTER — Encounter: Payer: Self-pay | Admitting: Family Medicine

## 2016-10-04 ENCOUNTER — Ambulatory Visit (INDEPENDENT_AMBULATORY_CARE_PROVIDER_SITE_OTHER): Payer: BLUE CROSS/BLUE SHIELD | Admitting: Family Medicine

## 2016-10-04 VITALS — BP 118/84 | HR 88 | Temp 98.2°F | Ht 64.0 in | Wt 163.0 lb

## 2016-10-04 DIAGNOSIS — Z6830 Body mass index (BMI) 30.0-30.9, adult: Secondary | ICD-10-CM

## 2016-10-04 DIAGNOSIS — E559 Vitamin D deficiency, unspecified: Secondary | ICD-10-CM

## 2016-10-04 DIAGNOSIS — E669 Obesity, unspecified: Secondary | ICD-10-CM

## 2016-10-04 DIAGNOSIS — Z9189 Other specified personal risk factors, not elsewhere classified: Secondary | ICD-10-CM | POA: Diagnosis not present

## 2016-10-04 DIAGNOSIS — E782 Mixed hyperlipidemia: Secondary | ICD-10-CM | POA: Diagnosis not present

## 2016-10-05 MED ORDER — VITAMIN D (ERGOCALCIFEROL) 1.25 MG (50000 UNIT) PO CAPS: 50000.0000 [IU] | ORAL_CAPSULE | ORAL | 0 refills | Status: DC

## 2016-10-05 NOTE — Progress Notes (Signed)
Office: [phone]  /  Fax: [phone]   HPI:   Chief Complaint: OBESITY Donna [name] is here to discuss her progress with her obesity treatment plan. She is on the  follow the Category 2 plan and is following her eating plan approximately 99 % of the time. She states she is exercising swimming/walking 30 minutes 2 times per week. [name] did well with weight loss but didn't like all the protein and would prefer a lower meat meal. She is frustrated that she cannot eat what she wants and lose weight. She has a history of diverticulosis and possible multiple episodes of diverticulitis which affects some of her food choices. Her weight is 163 lb (73.9 kg) today and has had a weight loss of 4 pounds over a period of 2 weeks since her last visit. She has lost 4 lbs since starting treatment with [place].  Vitamin D deficiency Donna [name] has a new diagnosis of vitamin D deficiency. She is not currently taking vit D and admits fatigue. She denies nausea, vomiting or muscle weakness.  Mixed Hyperlipidemia Donna [name] has a new diagnosis of mixed hyperlipidemia, elevated LDL and low HDL.  She has been trying to improve her cholesterol levels with intensive lifestyle modification including a low saturated fat diet, exercise and weight loss. She denies any chest pain, claudication or myalgias.  At risk for cardiovascular disease Donna [name] is at a higher than average risk for cardiovascular disease due to obesity and mixed hyperlipidemia. She currently denies any chest pain.  ALLERGIES: Allergies  Allergen Reactions  . Epinephrine Other (See Comments)    Shakes like shes having a seizure  . Augmentin [Amoxicillin-Pot Clavulanate]     States it burns her inside   . Codeine Hives  . Cortisone Swelling  . Levaquin [Levofloxacin In D5w] Other (See Comments)    Blisters on hands  . Omnipred [Prednisolone Acetate] Other (See Comments)    Migraine, emesis  . Sulfa Antibiotics Nausea And Vomiting  . Tequin  [Gatifloxacin] Other (See Comments)    Blisters on hands    MEDICATIONS: Current Outpatient Prescriptions on File Prior to Visit  Medication Sig Dispense Refill  . Glucosamine-Chondroitin 1500-1200 MG/30ML LIQD Take 5 mLs by mouth.    . hydrochlorothiazide (HYDRODIURIL) 25 MG tablet Take 1 tablet (25 mg total) by mouth daily. 30 tablet 1  . levonorgestrel (MIRENA) 20 MCG/24HR IUD 1 each by Intrauterine route once.    Marland Kitchen omeprazole (PRILOSEC) 10 MG capsule Take 10 mg by mouth daily.    . polyethylene glycol (MIRALAX / GLYCOLAX) packet Take 17 g by mouth every other day.    . saccharomyces boulardii (FLORASTOR) 250 MG capsule Take 250 mg by mouth every morning.     No current facility-administered medications on file prior to visit.     PAST MEDICAL HISTORY: Past Medical History:  Diagnosis Date  . Allergy   . Anxiety   . Constipation   . Depression   . Diverticulitis   . Elevated BP without diagnosis of hypertension   . GERD (gastroesophageal reflux disease)   . HTN (hypertension)   . IBS (irritable bowel syndrome)   . Night muscle spasms    back  . Osteoarthritis (arthritis due to wear and tear of joints)    knees  . Ovarian cyst   . Pain in the abdomen    lower left quarter  . Palpitations   . SOB (shortness of breath) on exertion   . Swelling    feet and legs  PAST SURGICAL HISTORY: Past Surgical History:  Procedure Laterality Date  . ABDOMINAL ADHESION SURGERY    . acl replacement  2000  . ovarian cysts      SOCIAL HISTORY: Social History  Substance Use Topics  . Smoking status: Never Smoker  . Smokeless tobacco: Never Used  . Alcohol use Yes     Comment: occassionally    FAMILY HISTORY: Family History  Problem Relation Age of Onset  . Heart disease Father   . Hypertension Father   . Stroke Father   . Diabetes Father   . Hyperlipidemia Father   . Kidney disease Father   . Sleep apnea Father   . Obesity Father   . Breast cancer Maternal Aunt     . Breast cancer Maternal Grandmother   . Anxiety disorder Mother     ROS: Review of Systems  Constitutional: Positive for malaise/fatigue and weight loss.  Cardiovascular: Negative for chest pain and claudication.  Gastrointestinal: Negative for nausea and vomiting.  Musculoskeletal: Negative for myalgias.       Negative muscle weakness    PHYSICAL EXAM: Blood pressure 118/84, pulse 88, temperature 98.2 F (36.8 C), temperature source Oral, height 5\' 4"  (1.626 m), weight 163 lb (73.9 kg), SpO2 98 %. Body mass index is 27.98 kg/m. Physical Exam  Constitutional: She is oriented to person, place, and time. She appears well-developed and well-nourished.  Cardiovascular: Normal rate.   Pulmonary/Chest: Effort normal.  Musculoskeletal: Normal range of motion.  Neurological: She is oriented to person, place, and time.  Skin: Skin is warm and dry.  Psychiatric: She has a normal mood and affect. Her behavior is normal.  Vitals reviewed.   RECENT LABS AND TESTS: BMET    Component Value Date/Time   NA 139 09/19/2016 1056   K 3.9 09/19/2016 1056   CL 98 09/19/2016 1056   CO2 27 09/19/2016 1056   GLUCOSE 101 (H) 09/19/2016 1056   GLUCOSE 106 (H) 12/11/2015 0945   BUN 12 09/19/2016 1056   CREATININE 0.72 09/19/2016 1056   CALCIUM 9.3 09/19/2016 1056   GFRNONAA 98 09/19/2016 1056   GFRAA 113 09/19/2016 1056   Lab Results  Component Value Date   HGBA1C 5.6 09/19/2016   Lab Results  Component Value Date   INSULIN 8.8 09/19/2016   CBC    Component Value Date/Time   WBC 7.9 09/19/2016 1056   WBC 12.4 (H) 12/11/2015 0945   RBC 4.65 09/19/2016 1056   RBC 4.57 12/11/2015 0945   HGB 14.1 12/11/2015 0945   HCT 42.3 09/19/2016 1056   PLT 340 06/13/2016 1153   MCV 91 09/19/2016 1056   MCH 31.0 09/19/2016 1056   MCH 30.9 12/11/2015 0945   MCHC 34.0 09/19/2016 1056   MCHC 34.0 12/11/2015 0945   RDW 13.2 09/19/2016 1056   LYMPHSABS 1.5 09/19/2016 1056   EOSABS 0.0  09/19/2016 1056   BASOSABS 0.0 09/19/2016 1056   Iron/TIBC/Ferritin/ %Sat No results found for: IRON, TIBC, FERRITIN, IRONPCTSAT Lipid Panel     Component Value Date/Time   CHOL 207 (H) 09/19/2016 1056   TRIG 138 09/19/2016 1056   HDL 41 09/19/2016 1056   CHOLHDL 4.6 (H) 06/13/2016 1153   LDLCALC 138 (H) 09/19/2016 1056   Hepatic Function Panel     Component Value Date/Time   PROT 7.6 09/19/2016 1056   ALBUMIN 4.6 09/19/2016 1056   AST 16 09/19/2016 1056   ALT 22 09/19/2016 1056   ALKPHOS 69 09/19/2016 1056   BILITOT  0.5 09/19/2016 1056      Component Value Date/Time   TSH 1.120 09/19/2016 1056   TSH 1.170 06/13/2016 1153    ASSESSMENT AND PLAN: Vitamin D deficiency - Plan: Vitamin D, Ergocalciferol, (DRISDOL) 50000 units CAPS capsule  Mixed hyperlipidemia  At risk for heart disease  Class 1 obesity without serious comorbidity with body mass index (BMI) of 30.0 to 30.9 in adult, unspecified obesity type - starting BMI greater than 30  PLAN:  Vitamin D Deficiency Shawna OrleansMelanie was informed that low vitamin D levels contributes to fatigue and are associated with obesity, breast, and colon cancer. She agrees to start to take prescription Vit D @50 ,000 IU every week #4 with no refills and will follow up for routine testing of vitamin D, at least 2-3 times per year. She was informed of the risk of over-replacement of vitamin D and agrees to not increase her dose unless he discusses this with us first. Shawna OrleansMelanie agrees to follow up with our clinic in 2 weeks.  Mixed Hyperlipidemia Shawna OrleansMelanie was informed of the American Heart Association Guidelines emphasizing intensive lifestyle modifications as the first line treatment for hyperlipidemia. We discussed many lifestyle modifications today in depth, and Shawna OrleansMelanie will continue to work on decreasing saturated fats such as fatty red meat, butter and many fried foods. She will also increase vegetables and lean protein in her diet and continue to  work on exercise and weight loss efforts. We will re-check labs in 3 months and Shawna OrleansMelanie agrees to follow up as directed.  Cardiovascular risk counselling Shawna OrleansMelanie was given extended (at least 15 minutes) coronary artery disease prevention counseling today. She is 51 y.o. female and has risk factors for heart disease including obesity and mixed hyperlipidemia. We discussed intensive lifestyle modifications today with an emphasis on specific weight loss instructions and strategies. Pt was also informed of the importance of increasing exercise and decreasing saturated fats to help prevent heart disease.  Obesity Shawna OrleansMelanie is currently in the action stage of change. As such, her goal is to continue with weight loss efforts She has agreed to change to Pescatarian plan Shawna OrleansMelanie has been instructed to work up to a goal of 150 minutes of combined cardio and strengthening exercise per week for weight loss and overall health benefits. We discussed the following Behavioral Modification Strategies today: increasing lean protein intake and no skipping meals  Shawna OrleansMelanie has agreed to follow up with our clinic in 2 weeks. She was informed of the importance of frequent follow up visits to maximize her success with intensive lifestyle modifications for her multiple health conditions.  I, [place] CraneJoanne Murray, am acting as scribe for Quillian Quincearen Nyomi Howser, MD  I have reviewed the above documentation for accuracy and completeness, and I agree with the above. -Quillian Quincearen Rolonda Pontarelli, MD  OBESITY BEHAVIORAL INTERVENTION VISIT  Today's visit was # 2 out of 22.  Starting weight: 167 lbs Starting date: 09/19/16 Today's weight : 163 lbs Today's date: 10/04/2016 Total lbs lost to date: 4 (Patients must lose 7 lbs in the first 6 months to continue with counseling)   ASK: We discussed the diagnosis of obesity with Bennye AlmMelanie J Muckle today and Shawna OrleansMelanie agreed to give us permission to discuss obesity behavioral modification therapy  today.  ASSESS: Shawna OrleansMelanie has the diagnosis of obesity and her BMI today is 4328 Precious is in the action stage of change   ADVISE: Shawna OrleansMelanie was educated on the multiple health risks of obesity as well as the benefit of weight loss to improve her health.  She was advised of the need for long term treatment and the importance of lifestyle modifications.  AGREE: Multiple dietary modification options and treatment options were discussed and  Kween agreed to change to Pescatarian plan We discussed the following Behavioral Modification Strategies today: increasing lean protein intake and no skipping meals.

## 2016-10-17 ENCOUNTER — Ambulatory Visit (INDEPENDENT_AMBULATORY_CARE_PROVIDER_SITE_OTHER): Payer: BLUE CROSS/BLUE SHIELD | Admitting: Family Medicine

## 2016-10-17 VITALS — BP 123/81 | HR 90 | Temp 97.8°F | Ht 64.0 in | Wt 164.0 lb

## 2016-10-17 DIAGNOSIS — Z6830 Body mass index (BMI) 30.0-30.9, adult: Secondary | ICD-10-CM

## 2016-10-17 DIAGNOSIS — E669 Obesity, unspecified: Secondary | ICD-10-CM | POA: Diagnosis not present

## 2016-10-17 DIAGNOSIS — K5909 Other constipation: Secondary | ICD-10-CM | POA: Diagnosis not present

## 2016-10-17 DIAGNOSIS — F3289 Other specified depressive episodes: Secondary | ICD-10-CM | POA: Diagnosis not present

## 2016-10-17 MED ORDER — BUPROPION HCL ER (SR) 150 MG PO TB12: 150.0000 mg | ORAL_TABLET | Freq: Every day | ORAL | 0 refills | Status: DC

## 2016-10-17 NOTE — Progress Notes (Signed)
Office: [phone]  /  Fax: [phone]   HPI:   Chief Complaint: OBESITY Donna [name] is here to discuss her progress with her obesity treatment plan. She is on the  follow the Pescatarian eating plan and is following her eating plan approximately 90 % of the time. She states she is swimming for 30 minutes 3 to 4 times per week. Donna [name]  Is deviating from plan more and had increased cravings during her cycle and has had increased stress recently. Her weight is 164 lb (74.4 kg) today and has had a weight gain of 1 pound over a period of 2 weeks since her last visit. She has lost 3 lbs since starting treatment with [place].  Depression with emotional eating behaviors Donna [name] is struggling with increased emotional eating, worse around her cycle (on IUD and no cycle) and had significant cravings to where she isn't following her plan closely. Donna [name] struggles with emotional eating and using food for comfort to the extent that it is negatively impacting her health. She often snacks when she is not hungry. Donna [name] sometimes feels she is out of control and then feels guilty that she made poor food choices. She has been working on behavior modification techniques to help reduce her emotional eating and has been somewhat successful. She shows no sign of suicidal or homicidal ideations.  Constipation Donna [name] has chronic constipation with history of diverticulitis and diverticulosis. She takes miralax prn. She admits abdominal pain, unchanged from her normal level..   Depression screen Donna [name] 2/9 09/19/2016 09/15/2016 06/13/2016 05/16/2016 04/06/2016  Decreased Interest 3 0 0 0 0  Down, Depressed, Hopeless 1 0 0 0 0  PHQ - 2 Score 4 0 0 0 0  Altered sleeping 3 - - - -  Tired, decreased energy 3 - - - -  Change in appetite 1 - - - -  Feeling bad or failure about yourself  0 - - - -  Trouble concentrating 2 - - - -  Moving slowly or fidgety/restless 1 - - - -  PHQ-9 Score 14 - - - -     ALLERGIES: Allergies    Allergen Reactions   Epinephrine Other (See Comments)    Shakes like shes having a seizure   Augmentin [Amoxicillin-Pot Clavulanate]     States it burns her inside    Codeine Hives   Cortisone Swelling   Levaquin [Levofloxacin In D5w] Other (See Comments)    Blisters on hands   Omnipred [Prednisolone Acetate] Other (See Comments)    Migraine, emesis   Sulfa Antibiotics Nausea And Vomiting   Tequin [Gatifloxacin] Other (See Comments)    Blisters on hands    MEDICATIONS: Current Outpatient Prescriptions on File Prior to Visit  Medication Sig Dispense Refill   Glucosamine-Chondroitin 1500-1200 MG/30ML LIQD Take 5 mLs by mouth.     hydrochlorothiazide (HYDRODIURIL) 25 MG tablet Take 1 tablet (25 mg total) by mouth daily. 30 tablet 1   levonorgestrel (MIRENA) 20 MCG/24HR IUD 1 each by Intrauterine route once.     polyethylene glycol (MIRALAX / GLYCOLAX) packet Take 17 g by mouth daily. Take a half a dose daily     Vitamin D, Ergocalciferol, (DRISDOL) 50000 units CAPS capsule Take 1 capsule (50,000 Units total) by mouth every 7 (seven) days. 4 capsule 0   omeprazole (PRILOSEC) 10 MG capsule Take 10 mg by mouth daily.     saccharomyces boulardii (FLORASTOR) 250 MG capsule Take 250 mg by mouth every morning.     No  current facility-administered medications on file prior to visit.     PAST MEDICAL HISTORY: Past Medical History:  Diagnosis Date   Allergy    Anxiety    Constipation    Depression    Diverticulitis    Elevated BP without diagnosis of hypertension    GERD (gastroesophageal reflux disease)    HTN (hypertension)    IBS (irritable bowel syndrome)    Night muscle spasms    back   Osteoarthritis (arthritis due to wear and tear of joints)    knees   Ovarian cyst    Pain in the abdomen    lower left quarter   Palpitations    SOB (shortness of breath) on exertion    Swelling    feet and legs    PAST SURGICAL HISTORY: Past Surgical  History:  Procedure Laterality Date   ABDOMINAL ADHESION SURGERY     acl replacement  2000   ovarian cysts      SOCIAL HISTORY: Social History  Substance Use Topics   Smoking status: Never Smoker   Smokeless tobacco: Never Used   Alcohol use Yes     Comment: occassionally    FAMILY HISTORY: Family History  Problem Relation Age of Onset   Heart disease Father    Hypertension Father    Stroke Father    Diabetes Father    Hyperlipidemia Father    Kidney disease Father    Sleep apnea Father    Obesity Father    Breast cancer Maternal Aunt    Breast cancer Maternal Grandmother    Anxiety disorder Mother     ROS: Review of Systems  Constitutional: Negative for weight loss.  Psychiatric/Behavioral: Positive for depression. Negative for suicidal ideas.    PHYSICAL EXAM: Blood pressure 123/81, pulse 90, temperature 97.8 F (36.6 C), temperature source Oral, height 5\' 4"  (1.626 m), weight 164 lb (74.4 kg), SpO2 99 %. Body mass index is 28.15 kg/m. Physical Exam  Constitutional: She is oriented to person, place, and time. She appears well-developed and well-nourished.  Cardiovascular: Normal rate.   Pulmonary/Chest: Effort normal.  Musculoskeletal: Normal range of motion.  Neurological: She is oriented to person, place, and time.  Skin: Skin is warm and dry.  Psychiatric: She has a normal mood and affect. Her behavior is normal.  Vitals reviewed.   RECENT LABS AND TESTS: BMET    Component Value Date/Time   NA 139 09/19/2016 1056   K 3.9 09/19/2016 1056   CL 98 09/19/2016 1056   CO2 27 09/19/2016 1056   GLUCOSE 101 (H) 09/19/2016 1056   GLUCOSE 106 (H) 12/11/2015 0945   BUN 12 09/19/2016 1056   CREATININE 0.72 09/19/2016 1056   CALCIUM 9.3 09/19/2016 1056   GFRNONAA 98 09/19/2016 1056   GFRAA 113 09/19/2016 1056   Lab Results  Component Value Date   HGBA1C 5.6 09/19/2016   Lab Results  Component Value Date   INSULIN 8.8 09/19/2016    CBC    Component Value Date/Time   WBC 7.9 09/19/2016 1056   WBC 12.4 (H) 12/11/2015 0945   RBC 4.65 09/19/2016 1056   RBC 4.57 12/11/2015 0945   HGB 14.4 09/19/2016 1056   HCT 42.3 09/19/2016 1056   PLT 340 06/13/2016 1153   MCV 91 09/19/2016 1056   MCH 31.0 09/19/2016 1056   MCH 30.9 12/11/2015 0945   MCHC 34.0 09/19/2016 1056   MCHC 34.0 12/11/2015 0945   RDW 13.2 09/19/2016 1056   LYMPHSABS 1.5 09/19/2016  1056   EOSABS 0.0 09/19/2016 1056   BASOSABS 0.0 09/19/2016 1056   Iron/TIBC/Ferritin/ %Sat No results found for: IRON, TIBC, FERRITIN, IRONPCTSAT Lipid Panel     Component Value Date/Time   CHOL 207 (H) 09/19/2016 1056   TRIG 138 09/19/2016 1056   HDL 41 09/19/2016 1056   CHOLHDL 4.6 (H) 06/13/2016 1153   LDLCALC 138 (H) 09/19/2016 1056   Hepatic Function Panel     Component Value Date/Time   PROT 7.6 09/19/2016 1056   ALBUMIN 4.6 09/19/2016 1056   AST 16 09/19/2016 1056   ALT 22 09/19/2016 1056   ALKPHOS 69 09/19/2016 1056   BILITOT 0.5 09/19/2016 1056      Component Value Date/Time   TSH 1.120 09/19/2016 1056   TSH 1.170 06/13/2016 1153    ASSESSMENT AND PLAN: Other depression - Plan: buPROPion (WELLBUTRIN SR) 150 MG 12 hr tablet  Other constipation  Class 1 obesity without serious comorbidity with body mass index (BMI) of 30.0 to 30.9 in adult, unspecified obesity type - Starting BMI greater then 30  PLAN:  Depression with Emotional Eating Behaviors We discussed behavior modification techniques today to help Donna [name] deal with her emotional eating and depression. She has agreed to start to take Wellbutrin SR 150 mg every morning #30 with no refills and she agreed to follow up as directed.  Constipation Donna [name] was informed decrease bowel movement frequency is normal while losing weight, but stools should not be hard or painful. She was advised to increase her H20 intake and work on increasing her fiber intake. High fiber foods were discussed  today. Donna [name] agrees to increase miralax to 1/2 dose daily and follow up with her GI doctor. She agrees to follow up with our clinic in 2 to 3 weeks.  Obesity Donna [name] is currently in the action stage of change. As such, her goal is to continue with weight loss efforts She has agreed to follow the Pescatarian eating plan Donna [name] has been instructed to work up to a goal of 150 minutes of combined cardio and strengthening exercise per week for weight loss and overall health benefits. We discussed the following Behavioral Modification Strataeies today: meal planning & cooking strategies, increasing lean protein intake and decreasing simple carbohydrates   Donna [name] has agreed to follow up with our clinic in 2 to 3 weeks. She was informed of the importance of frequent follow up visits to maximize her success with intensive lifestyle modifications for her multiple health conditions.  I, [place] Crane, am acting as scribe for Quillian Quince, MD  I have reviewed the above documentation for accuracy and completeness, and I agree with the above. -Quillian Quince, MD  OBESITY BEHAVIORAL INTERVENTION VISIT  Today's visit was # 3 out of 22.  Starting weight: 167 lbs Starting date: 09/19/16 Today's weight : 164 lbs Today's date: 10/17/2016 Total lbs lost to date: 3 (Patients must lose 7 lbs in the first 6 months to continue with counseling)   ASK: We discussed the diagnosis of obesity with Donna [name] today and Donna [name] agreed to give [place] permission to discuss obesity behavioral modification therapy today.  ASSESS: Zykeriah has the diagnosis of obesity and her BMI today is 28.2 Donna [name] is in the action stage of change   ADVISE: Donna [name] was educated on the multiple health risks of obesity as well as the benefit of weight loss to improve her health. She was advised of the need for long term treatment and the importance of lifestyle modifications.  AGREE:  Multiple dietary modification options and  treatment options were discussed and  Donna [name] agreed to follow the Pescatarian eating plan We discussed the following Behavioral Modification Strategies today: meal planning & cooking strategies, increasing lean protein intake and decreasing simple carbohydrates

## 2016-10-19 NOTE — Telephone Encounter (Signed)
error 

## 2016-10-28 DIAGNOSIS — R1012 Left upper quadrant pain: Secondary | ICD-10-CM | POA: Diagnosis not present

## 2016-11-08 ENCOUNTER — Other Ambulatory Visit: Payer: Self-pay | Admitting: Family Medicine

## 2016-11-10 ENCOUNTER — Other Ambulatory Visit (INDEPENDENT_AMBULATORY_CARE_PROVIDER_SITE_OTHER): Payer: Self-pay

## 2016-11-10 DIAGNOSIS — E559 Vitamin D deficiency, unspecified: Secondary | ICD-10-CM

## 2016-11-10 MED ORDER — VITAMIN D (ERGOCALCIFEROL) 1.25 MG (50000 UNIT) PO CAPS: 50000.0000 [IU] | ORAL_CAPSULE | ORAL | 0 refills | Status: DC

## 2016-11-14 ENCOUNTER — Ambulatory Visit (INDEPENDENT_AMBULATORY_CARE_PROVIDER_SITE_OTHER): Payer: BLUE CROSS/BLUE SHIELD | Admitting: Family Medicine

## 2016-11-14 VITALS — BP 114/74 | HR 86 | Temp 98.3°F | Ht 64.0 in | Wt 165.0 lb

## 2016-11-14 DIAGNOSIS — Z6830 Body mass index (BMI) 30.0-30.9, adult: Secondary | ICD-10-CM | POA: Diagnosis not present

## 2016-11-14 DIAGNOSIS — F32A Depression, unspecified: Secondary | ICD-10-CM | POA: Insufficient documentation

## 2016-11-14 DIAGNOSIS — F3289 Other specified depressive episodes: Secondary | ICD-10-CM

## 2016-11-14 DIAGNOSIS — E782 Mixed hyperlipidemia: Secondary | ICD-10-CM | POA: Diagnosis not present

## 2016-11-14 DIAGNOSIS — E669 Obesity, unspecified: Secondary | ICD-10-CM | POA: Diagnosis not present

## 2016-11-14 DIAGNOSIS — F329 Major depressive disorder, single episode, unspecified: Secondary | ICD-10-CM | POA: Insufficient documentation

## 2016-11-15 NOTE — Progress Notes (Signed)
Office: [phone]  /  Fax: [phone]   HPI:   Chief Complaint: OBESITY Donna [name] is here to discuss her progress with her obesity treatment plan. She is on the Pescatarian eating plan and is following her eating plan approximately 95 % of the time. She states she is walking, swimming for 30 minutes 5 to 7 times per week. Donna [name] did well maintaining weight on vacation. She worked on decreasing ETOH and increasing protein even though she didn't always make the best choices. She increased walking. Donna [name] is ready to get back on track. Her weight is 165 lb (74.8 kg) today and has had a weight gain of 1 pound over a period of 4 weeks since her last visit. She has lost 2 lbs since starting treatment with [place]. Hyperlipidemia Donna [name] has hyperlipidemia and has been trying to improve her cholesterol levels with intensive lifestyle modification including a low saturated fat diet, exercise and weight loss. She denies any chest pain, claudication or myalgias.   Depression with emotional eating behaviors Donna [name] decided to not take Wellbutrin and is still struggling to control emotional eating. Donna [name] struggles with emotional eating and using food for comfort to the extent that it is negatively impacting her health. She often snacks when she is not hungry. Donna [name] sometimes feels she is out of control and then feels guilty that she made poor food choices. She has been working on behavior modification techniques to help reduce her emotional eating and has been somewhat successful. She shows no sign of suicidal or homicidal ideations.  Depression screen [facility] 2/9 09/19/2016 09/15/2016 06/13/2016 05/16/2016 04/06/2016  Decreased Interest 3 0 0 0 0  Down, Depressed, Hopeless 1 0 0 0 0  PHQ - 2 Score 4 0 0 0 0  Altered sleeping 3 - - - -  Tired, decreased energy 3 - - - -  Change in appetite 1 - - - -  Feeling bad or failure about yourself  0 - - - -  Trouble concentrating 2 - - - -  Moving slowly or  fidgety/restless 1 - - - -  PHQ-9 Score 14 - - - -      ALLERGIES: Allergies  Allergen Reactions   Epinephrine Other (See Comments)    Shakes like shes having a seizure   Augmentin [Amoxicillin-Pot Clavulanate]     States it burns her inside    Codeine Hives   Cortisone Swelling   Levaquin [Levofloxacin In D5w] Other (See Comments)    Blisters on hands   Omnipred [Prednisolone Acetate] Other (See Comments)    Migraine, emesis   Sulfa Antibiotics Nausea And Vomiting   Tequin [Gatifloxacin] Other (See Comments)    Blisters on hands    MEDICATIONS: Current Outpatient Prescriptions on File Prior to Visit  Medication Sig Dispense Refill   buPROPion (WELLBUTRIN SR) 150 MG 12 hr tablet Take 1 tablet (150 mg total) by mouth daily. 30 tablet 0   Glucosamine-Chondroitin 1500-1200 MG/30ML LIQD Take 5 mLs by mouth.     hydrochlorothiazide (HYDRODIURIL) 25 MG tablet TAKE 1 TABLET BY MOUTH EVERY DAY 90 tablet 0   levonorgestrel (MIRENA) 20 MCG/24HR IUD 1 each by Intrauterine route once.     omeprazole (PRILOSEC) 10 MG capsule Take 10 mg by mouth daily.     polyethylene glycol (MIRALAX / GLYCOLAX) packet Take 17 g by mouth daily. Take a half a dose daily     saccharomyces boulardii (FLORASTOR) 250 MG capsule Take 250 mg by mouth every morning.  Vitamin D, Ergocalciferol, (DRISDOL) 50000 units CAPS capsule Take 1 capsule (50,000 Units total) by mouth every 7 (seven) days. 4 capsule 0   No current facility-administered medications on file prior to visit.     PAST MEDICAL HISTORY: Past Medical History:  Diagnosis Date   Allergy    Anxiety    Constipation    Depression    Diverticulitis    Elevated BP without diagnosis of hypertension    GERD (gastroesophageal reflux disease)    HTN (hypertension)    IBS (irritable bowel syndrome)    Night muscle spasms    back   Osteoarthritis (arthritis due to wear and tear of joints)    knees   Ovarian cyst     Pain in the abdomen    lower left quarter   Palpitations    SOB (shortness of breath) on exertion    Swelling    feet and legs    PAST SURGICAL HISTORY: Past Surgical History:  Procedure Laterality Date   ABDOMINAL ADHESION SURGERY     acl replacement  2000   ovarian cysts      SOCIAL HISTORY: Social History  Substance Use Topics   Smoking status: Never Smoker   Smokeless tobacco: Never Used   Alcohol use Yes     Comment: occassionally    FAMILY HISTORY: Family History  Problem Relation Age of Onset   Heart disease Father    Hypertension Father    Stroke Father    Diabetes Father    Hyperlipidemia Father    Kidney disease Father    Sleep apnea Father    Obesity Father    Breast cancer Maternal Aunt    Breast cancer Maternal Grandmother    Anxiety disorder Mother     ROS: Review of Systems  Constitutional: Negative for weight loss.  Psychiatric/Behavioral: Positive for depression. Negative for suicidal ideas.    PHYSICAL EXAM: Blood pressure 114/74, pulse 86, temperature 98.3 F (36.8 C), temperature source Oral, height 5\' 4"  (1.626 m), weight 165 lb (74.8 kg), SpO2 97 %. Body mass index is 28.32 kg/m. Physical Exam  Constitutional: She is oriented to person, place, and time. She appears well-developed and well-nourished.  Cardiovascular: Normal rate.   Pulmonary/Chest: Effort normal.  Musculoskeletal: Normal range of motion.  Neurological: She is oriented to person, place, and time.  Skin: Skin is warm and dry.  Psychiatric: She has a normal mood and affect. Her behavior is normal.  Vitals reviewed.   RECENT LABS AND TESTS: BMET    Component Value Date/Time   NA 139 09/19/2016 1056   K 3.9 09/19/2016 1056   CL 98 09/19/2016 1056   CO2 27 09/19/2016 1056   GLUCOSE 101 (H) 09/19/2016 1056   GLUCOSE 106 (H) 12/11/2015 0945   BUN 12 09/19/2016 1056   CREATININE 0.72 09/19/2016 1056   CALCIUM 9.3 09/19/2016 1056   GFRNONAA 98  09/19/2016 1056   GFRAA 113 09/19/2016 1056   Lab Results  Component Value Date   HGBA1C 5.6 09/19/2016   Lab Results  Component Value Date   INSULIN 8.8 09/19/2016   CBC    Component Value Date/Time   WBC 7.9 09/19/2016 1056   WBC 12.4 (H) 12/11/2015 0945   RBC 4.65 09/19/2016 1056   RBC 4.57 12/11/2015 0945   HGB 14.4 09/19/2016 1056   HCT 42.3 09/19/2016 1056   PLT 340 06/13/2016 1153   MCV 91 09/19/2016 1056   MCH 31.0 09/19/2016 1056   MCH  30.9 12/11/2015 0945   MCHC 34.0 09/19/2016 1056   MCHC 34.0 12/11/2015 0945   RDW 13.2 09/19/2016 1056   LYMPHSABS 1.5 09/19/2016 1056   EOSABS 0.0 09/19/2016 1056   BASOSABS 0.0 09/19/2016 1056   Iron/TIBC/Ferritin/ %Sat No results found for: IRON, TIBC, FERRITIN, IRONPCTSAT Lipid Panel     Component Value Date/Time   CHOL 207 (H) 09/19/2016 1056   TRIG 138 09/19/2016 1056   HDL 41 09/19/2016 1056   CHOLHDL 4.6 (H) 06/13/2016 1153   LDLCALC 138 (H) 09/19/2016 1056   Hepatic Function Panel     Component Value Date/Time   PROT 7.6 09/19/2016 1056   ALBUMIN 4.6 09/19/2016 1056   AST 16 09/19/2016 1056   ALT 22 09/19/2016 1056   ALKPHOS 69 09/19/2016 1056   BILITOT 0.5 09/19/2016 1056      Component Value Date/Time   TSH 1.120 09/19/2016 1056   TSH 1.170 06/13/2016 1153    ASSESSMENT AND PLAN: Mixed hyperlipidemia  Other depression  Class 1 obesity with serious comorbidity and body mass index (BMI) of 30.0 to 30.9 in adult, unspecified obesity type - BMI >30 when patient began the program  PLAN:  Hyperlipidemia Shawna OrleansMelanie was informed of the American Heart Association Guidelines emphasizing intensive lifestyle modifications as the first line treatment for hyperlipidemia. We discussed many lifestyle modifications today in depth, and Shawna OrleansMelanie will continue to work on decreasing saturated fats such as fatty red meat, butter and many fried foods. She will also increase vegetables and lean protein in her diet and  continue to work on exercise and weight loss efforts.   Depression with Emotional Eating Behaviors We discussed behavior modification techniques today to help Jennene decrease her emotional eating and depression. She has agreed to follow up as directed.  We spent > than 50% of the 15 minute visit on the counseling as documented in the note.  Obesity Shawna OrleansMelanie is currently in the action stage of change. As such, her goal is to continue with weight loss efforts She has agreed to follow the Pescatarian eating plan Shawna OrleansMelanie has been instructed to work up to a goal of 150 minutes of combined cardio and strengthening exercise per week or back to cycling for 15 minutes 3 to 4 times per week for weight loss and overall health benefits. We discussed the following Behavioral Modification Strategies today: meal planning & cooking strategies, increasing lean protein intake and emotional eating strategies  Shawna OrleansMelanie has agreed to follow up with our clinic in 2 to 3 weeks. She was informed of the importance of frequent follow up visits to maximize her success with intensive lifestyle modifications for her multiple health conditions.  I, [place] CraneJoanne Murray, am acting as transcriptionist for Quillian Quincearen Beasley, MD  I have reviewed the above documentation for accuracy and completeness, and I agree with the above. -Quillian Quincearen Beasley, MD   OBESITY BEHAVIORAL INTERVENTION VISIT  Today's visit was # 4 out of 22.  Starting weight: 167 lbs Starting date: 09/19/16 Today's weight : 165 lbs  Today's date: 11/14/2016 Total lbs lost to date: 2 (Patients must lose 7 lbs in the first 6 months to continue with counseling)   ASK: We discussed the diagnosis of obesity with Bennye AlmMelanie J Seymour today and Shawna OrleansMelanie agreed to give us permission to discuss obesity behavioral modification therapy today.  ASSESS: Shawna OrleansMelanie has the diagnosis of obesity and her BMI today is 28.4 Shawna OrleansMelanie is in the action stage of change   ADVISE: Shawna OrleansMelanie was  educated on the multiple  health risks of obesity as well as the benefit of weight loss to improve her health. She was advised of the need for long term treatment and the importance of lifestyle modifications.  AGREE: Multiple dietary modification options and treatment options were discussed and  Damariz agreed to follow the Pescatarian eating plan We discussed the following Behavioral Modification Strategies today: meal planning & cooking strategies, increasing lean protein intake and emotional eating strategies

## 2016-11-17 ENCOUNTER — Ambulatory Visit: Payer: BLUE CROSS/BLUE SHIELD | Admitting: Family Medicine

## 2016-11-23 ENCOUNTER — Telehealth (INDEPENDENT_AMBULATORY_CARE_PROVIDER_SITE_OTHER): Payer: Self-pay | Admitting: Family Medicine

## 2016-11-23 NOTE — Telephone Encounter (Signed)
buPROPion (WELLBUTRIN SR) 150 MG 12 hr tablet  Pharmacy called req refill. req was sent several days ago.  [facility]

## 2016-11-23 NOTE — Telephone Encounter (Signed)
LM for pt to call back or send mychart message about if she really needs refill.  OV note states suggests she is not taking bupropion.   Dylyn Mclaren R CMA

## 2016-11-30 ENCOUNTER — Ambulatory Visit (INDEPENDENT_AMBULATORY_CARE_PROVIDER_SITE_OTHER): Payer: BLUE CROSS/BLUE SHIELD | Admitting: Family Medicine

## 2016-11-30 VITALS — BP 128/91 | HR 76 | Temp 98.4°F | Ht 64.0 in | Wt 165.0 lb

## 2016-11-30 DIAGNOSIS — Z6830 Body mass index (BMI) 30.0-30.9, adult: Secondary | ICD-10-CM | POA: Diagnosis not present

## 2016-11-30 DIAGNOSIS — J069 Acute upper respiratory infection, unspecified: Secondary | ICD-10-CM

## 2016-11-30 DIAGNOSIS — E669 Obesity, unspecified: Secondary | ICD-10-CM | POA: Diagnosis not present

## 2016-11-30 MED ORDER — CETIRIZINE HCL 10 MG PO TABS: 10.0000 mg | ORAL_TABLET | Freq: Every day | ORAL | 0 refills | Status: DC

## 2016-11-30 NOTE — Progress Notes (Signed)
Office: [phone]  /  Fax: [phone]   HPI:   Chief Complaint: OBESITY Donna [name] is here to discuss her progress with her obesity treatment plan. She is on the  follow the Pescatarian eating plan and is following her eating plan approximately 80 to 85 % of the time. She states she is walking for  45 minutes 1 time per week. Donna [name] maintained her weight. She has been sick recently and is not following her diet prescription. She has been unable to eat her protein due to nausea. Her weight is 165 lb (74.8 kg) today and has maintained weight over a period of 2 weeks since her last visit. She has lost 2 lbs since starting treatment with [place].  Viral Upper Respiratory Infection Donna [name] has had symptoms for 4 days with nasal congestion, rhinorrhea and non productive cough.   ALLERGIES: Allergies  Allergen Reactions  . Epinephrine Other (See Comments)    Shakes like shes having a seizure  . Augmentin [Amoxicillin-Pot Clavulanate]     States it burns her inside   . Codeine Hives  . Cortisone Swelling  . Levaquin [Levofloxacin In D5w] Other (See Comments)    Blisters on hands  . Omnipred [Prednisolone Acetate] Other (See Comments)    Migraine, emesis  . Sulfa Antibiotics Nausea And Vomiting  . Tequin [Gatifloxacin] Other (See Comments)    Blisters on hands    MEDICATIONS: Current Outpatient Prescriptions on File Prior to Visit  Medication Sig Dispense Refill  . buPROPion (WELLBUTRIN SR) 150 MG 12 hr tablet Take 1 tablet (150 mg total) by mouth daily. 30 tablet 0  . Glucosamine-Chondroitin 1500-1200 MG/30ML LIQD Take 5 mLs by mouth.    . levonorgestrel (MIRENA) 20 MCG/24HR IUD 1 each by Intrauterine route once.    Marland Kitchen omeprazole (PRILOSEC) 10 MG capsule Take 10 mg by mouth daily.    . polyethylene glycol (MIRALAX / GLYCOLAX) packet Take 17 g by mouth daily. Take a half a dose daily    . saccharomyces boulardii (FLORASTOR) 250 MG capsule Take 250 mg by mouth every morning.    .  Vitamin D, Ergocalciferol, (DRISDOL) 50000 units CAPS capsule Take 1 capsule (50,000 Units total) by mouth every 7 (seven) days. 4 capsule 0  . hydrochlorothiazide (HYDRODIURIL) 25 MG tablet TAKE 1 TABLET BY MOUTH EVERY DAY (Patient not taking: Reported on 11/30/2016) 90 tablet 0   No current facility-administered medications on file prior to visit.     PAST MEDICAL HISTORY: Past Medical History:  Diagnosis Date  . Allergy   . Anxiety   . Constipation   . Depression   . Diverticulitis   . Elevated BP without diagnosis of hypertension   . GERD (gastroesophageal reflux disease)   . HTN (hypertension)   . IBS (irritable bowel syndrome)   . Night muscle spasms    back  . Osteoarthritis (arthritis due to wear and tear of joints)    knees  . Ovarian cyst   . Pain in the abdomen    lower left quarter  . Palpitations   . SOB (shortness of breath) on exertion   . Swelling    feet and legs    PAST SURGICAL HISTORY: Past Surgical History:  Procedure Laterality Date  . ABDOMINAL ADHESION SURGERY    . acl replacement  2000  . ovarian cysts      SOCIAL HISTORY: Social History  Substance Use Topics  . Smoking status: Never Smoker  . Smokeless tobacco: Never Used  . Alcohol  use Yes     Comment: occassionally    FAMILY HISTORY: Family History  Problem Relation Age of Onset  . Heart disease Father   . Hypertension Father   . Stroke Father   . Diabetes Father   . Hyperlipidemia Father   . Kidney disease Father   . Sleep apnea Father   . Obesity Father   . Breast cancer Maternal Aunt   . Breast cancer Maternal Grandmother   . Anxiety disorder Mother     ROS: Review of Systems  Constitutional: Negative for weight loss.  HENT: Positive for congestion (nasal congestion).        Rhinorrhea   Respiratory: Positive for cough (non-productive).     PHYSICAL EXAM: Blood pressure (!) 128/91, pulse 76, temperature 98.4 F (36.9 C), temperature source Oral, height 5\' 4"   (1.626 m), weight 165 lb (74.8 kg), SpO2 99 %. Body mass index is 28.32 kg/m. Physical Exam  Constitutional: She is oriented to person, place, and time. She appears well-developed and well-nourished.  Cardiovascular: Normal rate.   Pulmonary/Chest: Effort normal.  Musculoskeletal: Normal range of motion.  Neurological: She is oriented to person, place, and time.  Skin: Skin is warm and dry.  Psychiatric: She has a normal mood and affect. Her behavior is normal.    RECENT LABS AND TESTS: BMET    Component Value Date/Time   NA 139 09/19/2016 1056   K 3.9 09/19/2016 1056   CL 98 09/19/2016 1056   CO2 27 09/19/2016 1056   GLUCOSE 101 (H) 09/19/2016 1056   GLUCOSE 106 (H) 12/11/2015 0945   BUN 12 09/19/2016 1056   CREATININE 0.72 09/19/2016 1056   CALCIUM 9.3 09/19/2016 1056   GFRNONAA 98 09/19/2016 1056   GFRAA 113 09/19/2016 1056   Lab Results  Component Value Date   HGBA1C 5.6 09/19/2016   Lab Results  Component Value Date   INSULIN 8.8 09/19/2016   CBC    Component Value Date/Time   WBC 7.9 09/19/2016 1056   WBC 12.4 (H) 12/11/2015 0945   RBC 4.65 09/19/2016 1056   RBC 4.57 12/11/2015 0945   HGB 14.4 09/19/2016 1056   HCT 42.3 09/19/2016 1056   PLT 340 06/13/2016 1153   MCV 91 09/19/2016 1056   MCH 31.0 09/19/2016 1056   MCH 30.9 12/11/2015 0945   MCHC 34.0 09/19/2016 1056   MCHC 34.0 12/11/2015 0945   RDW 13.2 09/19/2016 1056   LYMPHSABS 1.5 09/19/2016 1056   EOSABS 0.0 09/19/2016 1056   BASOSABS 0.0 09/19/2016 1056   Iron/TIBC/Ferritin/ %Sat No results found for: IRON, TIBC, FERRITIN, IRONPCTSAT Lipid Panel     Component Value Date/Time   CHOL 207 (H) 09/19/2016 1056   TRIG 138 09/19/2016 1056   HDL 41 09/19/2016 1056   CHOLHDL 4.6 (H) 06/13/2016 1153   LDLCALC 138 (H) 09/19/2016 1056   Hepatic Function Panel     Component Value Date/Time   PROT 7.6 09/19/2016 1056   ALBUMIN 4.6 09/19/2016 1056   AST 16 09/19/2016 1056   ALT 22 09/19/2016  1056   ALKPHOS 69 09/19/2016 1056   BILITOT 0.5 09/19/2016 1056      Component Value Date/Time   TSH 1.120 09/19/2016 1056   TSH 1.170 06/13/2016 1153    ASSESSMENT AND PLAN: Upper respiratory infection, viral - Plan: cetirizine (ZYRTEC) 10 MG tablet  Class 1 obesity without serious comorbidity with body mass index (BMI) of 30.0 to 30.9 in adult, unspecified obesity type - Starting BMI greater  then 30  PLAN:  Viral Upper Respiratory Infection Donna [name] agrees to start OTC Zyrtec-D for symptomatic treatment and was advised it is okay to use vaproizer at night. She agrees to follow up with PCP if symptoms worsen or fever develops and she will follow up with our clinic in 2 to 3 weeks.  Obesity Donna [name] is currently in the action stage of change. As such, her goal is to continue with weight loss efforts She has agreed to portion control better and make smarter food choices, such as increase vegetables and decrease simple carbohydrates when upper respiratory infection improves Donna [name] has been instructed to work up to a goal of 150 minutes of combined cardio and strengthening exercise per week for weight loss and overall health benefits. We discussed the following Behavioral Modification Strategies today: increasing lean protein intake and no skipping meals  Donna [name] has agreed to follow up with our clinic in 2 to 3 weeks. She was informed of the importance of frequent follow up visits to maximize her success with intensive lifestyle modifications for her multiple health conditions.  I, [place] CraneJoanne Murray, am acting as transcriptionist for Quillian Quincearen Pistol Kessenich, MD  I have reviewed the above documentation for accuracy and completeness, and I agree with the above. -Quillian Quincearen Maryjayne Kleven, MD   OBESITY BEHAVIORAL INTERVENTION VISIT  Today's visit was # 5 out of 22.  Starting weight: 167 lbs Starting date: 09/19/16 Today's weight : 165 lbs Today's date: 11/30/2016 Total lbs lost to date: 2 (Patients must  lose 7 lbs in the first 6 months to continue with counseling)   ASK: We discussed the diagnosis of obesity with Donna [name] today and Donna [name] agreed to give us permission to discuss obesity behavioral modification therapy today.  ASSESS: Donna [name] has the diagnosis of obesity and her BMI today is 28.4 Donna [name] is in the action stage of change   ADVISE: Donna [name] was educated on the multiple health risks of obesity as well as the benefit of weight loss to improve her health. She was advised of the need for long term treatment and the importance of lifestyle modifications.  AGREE: Multiple dietary modification options and treatment options were discussed and  Donna [name] agreed to portion control better and make smarter food choices, such as increase vegetables and decrease simple carbohydrates when upper respiratory infection improves We discussed the following Behavioral Modification Strategies today: increasing lean protein intake and no skipping meals

## 2016-12-12 ENCOUNTER — Encounter: Payer: Self-pay | Admitting: Physician Assistant

## 2016-12-12 ENCOUNTER — Ambulatory Visit (INDEPENDENT_AMBULATORY_CARE_PROVIDER_SITE_OTHER): Payer: BLUE CROSS/BLUE SHIELD

## 2016-12-12 ENCOUNTER — Ambulatory Visit (INDEPENDENT_AMBULATORY_CARE_PROVIDER_SITE_OTHER): Payer: BLUE CROSS/BLUE SHIELD | Admitting: Physician Assistant

## 2016-12-12 VITALS — BP 126/85 | HR 87 | Temp 99.0°F | Resp 16 | Ht 62.25 in | Wt 165.2 lb

## 2016-12-12 DIAGNOSIS — Z8719 Personal history of other diseases of the digestive system: Secondary | ICD-10-CM | POA: Diagnosis not present

## 2016-12-12 DIAGNOSIS — R1032 Left lower quadrant pain: Secondary | ICD-10-CM | POA: Diagnosis not present

## 2016-12-12 DIAGNOSIS — R140 Abdominal distension (gaseous): Secondary | ICD-10-CM | POA: Diagnosis not present

## 2016-12-12 DIAGNOSIS — R300 Dysuria: Secondary | ICD-10-CM

## 2016-12-12 LAB — POCT URINALYSIS DIP (MANUAL ENTRY)
Bilirubin, UA: NEGATIVE
Glucose, UA: NEGATIVE mg/dL
Ketones, POC UA: NEGATIVE mg/dL
Leukocytes, UA: NEGATIVE
Nitrite, UA: NEGATIVE
Protein Ur, POC: NEGATIVE mg/dL
Spec Grav, UA: 1.01 (ref 1.010–1.025)
Urobilinogen, UA: 0.2 E.U./dL
pH, UA: 6 (ref 5.0–8.0)

## 2016-12-12 LAB — POC MICROSCOPIC URINALYSIS (UMFC): Mucus: ABSENT

## 2016-12-12 LAB — POCT CBC
Granulocyte percent: 63.9 % (ref 37–80)
HCT, POC: 41 % (ref 37.7–47.9)
Hemoglobin: 14.4 g/dL (ref 12.2–16.2)
Lymph, poc: 2 (ref 0.6–3.4)
MCH, POC: 30.9 pg (ref 27–31.2)
MCHC: 35.1 g/dL (ref 31.8–35.4)
MCV: 88.2 fL (ref 80–97)
MID (cbc): 0.3 (ref 0–0.9)
MPV: 7.7 fL (ref 0–99.8)
POC Granulocyte: 4.2 (ref 2–6.9)
POC LYMPH PERCENT: 31 % (ref 10–50)
POC MID %: 5.1 %M (ref 0–12)
Platelet Count, POC: 308 10*3/uL (ref 142–424)
RBC: 4.65 M/uL (ref 4.04–5.48)
RDW, POC: 12.7 %
WBC: 6.6 10*3/uL (ref 4.6–10.2)

## 2016-12-12 NOTE — Progress Notes (Signed)
Donna [name]  MRN: [id number] DOB: 06-11-1965  PCP: Sherren Mocha, MD  Subjective:  Pt is a 51 year old female PMH depression, obesity, diverticulitis who presents to clinic for burning with urination and abdominal pain x 4 days. She has had symptoms for 4 days. Patient also complains of stomach ache and bloating. Patient denies back pain, fever, sorethroat and vaginal discharge. Patient does not have a history of recurrent UTI. Patient does not have a history of pyelonephritis.   Abdominal pain located in her LLQ and RLQ. Feels better when she is holding her stomach and laying down not moving. Worse with walking, standing or driving. Wearing shorts makes it hurt worse. Denies fever, chills, n/v/d, diaphoresis.  Last bowel movement was this morning and was normal. No blood.  H/o abdominal pain similar to this. H/o diverticulitis. Better with antibiotics and colon cleanse diet.  Neg CT 09/2016. Of note, she saw Dr. Claudine Mouton  gen surg on 6/22 for similar problem.  Plan: "As she is only taking 5 g of fiber supplement. She may double and triple her daily dose as needed. She is concerned about her diuretic causing her to be dehydrated and affecting her bowels. She may supplement her bowel regimen with MiraLAX as needed I reassured her this is reasonably safe. She is very concerned about having to take another course of antibiotics with recurrent diverticulitis, and hopefully a more aggressive bowel regimen will help prevent her from having another flareup. Due to her history of IBS, I trust he will be significant prudence in not too aggressively treating her with antibiotics."    Review of Systems  Constitutional: Negative for chills, fatigue and fever.  Respiratory: Negative for cough, shortness of breath and wheezing.   Cardiovascular: Negative for chest pain and palpitations.  Gastrointestinal: Positive for abdominal pain. Negative for diarrhea, nausea and vomiting.  Genitourinary: Positive for  dysuria. Negative for decreased urine volume, difficulty urinating, enuresis, flank pain, frequency, hematuria and urgency.  Musculoskeletal: Negative for back pain.  Neurological: Negative for dizziness, weakness, light-headedness and headaches.    Patient Active Problem List   Diagnosis Date Noted  . Depression 11/14/2016  . Class 1 obesity with serious comorbidity and body mass index (BMI) of 30.0 to 30.9 in adult 11/14/2016  . Vitamin D deficiency 10/04/2016  . Mixed hyperlipidemia 10/04/2016  . Class 1 obesity without serious comorbidity with body mass index (BMI) of 30.0 to 30.9 in adult 10/04/2016  . LLQ pain 06/16/2016    Current Outpatient Prescriptions on File Prior to Visit  Medication Sig Dispense Refill  . levonorgestrel (MIRENA) 20 MCG/24HR IUD 1 each by Intrauterine route once.    Marland Kitchen omeprazole (PRILOSEC) 10 MG capsule Take 10 mg by mouth daily.    . Vitamin D, Ergocalciferol, (DRISDOL) 50000 units CAPS capsule Take 1 capsule (50,000 Units total) by mouth every 7 (seven) days. 4 capsule 0  . buPROPion (WELLBUTRIN SR) 150 MG 12 hr tablet Take 1 tablet (150 mg total) by mouth daily. (Patient not taking: Reported on 12/12/2016) 30 tablet 0  . cetirizine (ZYRTEC) 10 MG tablet Take 1 tablet (10 mg total) by mouth daily. (Patient not taking: Reported on 12/12/2016) 30 tablet 0  . Glucosamine-Chondroitin 1500-1200 MG/30ML LIQD Take 5 mLs by mouth.    . hydrochlorothiazide (HYDRODIURIL) 25 MG tablet TAKE 1 TABLET BY MOUTH EVERY DAY (Patient not taking: Reported on 11/30/2016) 90 tablet 0  . polyethylene glycol (MIRALAX / GLYCOLAX) packet Take 17 g by mouth  daily. Take a half a dose daily    . saccharomyces boulardii (FLORASTOR) 250 MG capsule Take 250 mg by mouth every morning.     No current facility-administered medications on file prior to visit.     Allergies  Allergen Reactions  . Epinephrine Other (See Comments)    Shakes like shes having a seizure  . Augmentin  [Amoxicillin-Pot Clavulanate]     States it burns her inside   . Codeine Hives  . Cortisone Swelling  . Levaquin [Levofloxacin In D5w] Other (See Comments)    Blisters on hands  . Omnipred [Prednisolone Acetate] Other (See Comments)    Migraine, emesis  . Sulfa Antibiotics Nausea And Vomiting  . Tequin [Gatifloxacin] Other (See Comments)    Blisters on hands     Objective:  BP 126/85   Pulse 87   Temp 99 F (37.2 C) (Oral)   Resp 16   Ht 5' 2.25" (1.581 m)   Wt 165 lb 3.2 oz (74.9 kg)   SpO2 98%   BMI 29.97 kg/m   Physical Exam  Constitutional: She is oriented to person, place, and time and well-developed, well-nourished, and in no distress. No distress.  Cardiovascular: Normal rate, regular rhythm and normal heart sounds.   Abdominal: Soft. Normal appearance. She exhibits no distension. There is tenderness in the right lower quadrant, periumbilical area and left lower quadrant. There is no rigidity, no rebound, no guarding, no CVA tenderness and no tenderness at McBurney's point.  Neurological: She is alert and oriented to person, place, and time. GCS score is 15.  Skin: Skin is warm and dry.  Psychiatric: Mood, memory, affect and judgment normal.  Vitals reviewed.   Results for orders placed or performed in visit on 12/12/16  POCT Microscopic Urinalysis ([facility])  Result Value Ref Range   WBC,UR,HPF,POC Few (A) None WBC/hpf   RBC,UR,HPF,POC None None RBC/hpf   Bacteria Many (A) None, Too numerous to count   Mucus Absent Absent   Epithelial Cells, UR Per Microscopy Few (A) None, Too numerous to count cells/hpf  POCT urinalysis dipstick  Result Value Ref Range   Color, UA yellow yellow   Clarity, UA clear clear   Glucose, UA negative negative mg/dL   Bilirubin, UA negative negative   Ketones, POC UA negative negative mg/dL   Spec Grav, UA 4.166 0.630 - 1.025   Blood, UA trace-intact (A) negative   pH, UA 6.0 5.0 - 8.0   Protein Ur, POC negative negative mg/dL    Urobilinogen, UA 0.2 0.2 or 1.0 E.U./dL   Nitrite, UA Negative Negative   Leukocytes, UA Negative Negative  POCT CBC  Result Value Ref Range   WBC 6.6 4.6 - 10.2 K/uL   Lymph, poc 2.0 0.6 - 3.4   POC LYMPH PERCENT 31.0 10 - 50 %L   MID (cbc) 0.3 0 - 0.9   POC MID % 5.1 0 - 12 %M   POC Granulocyte 4.2 2 - 6.9   Granulocyte percent 63.9 37 - 80 %G   RBC 4.65 4.04 - 5.48 M/uL   Hemoglobin 14.4 12.2 - 16.2 g/dL   HCT, POC 16.0 10.9 - 47.9 %   MCV 88.2 80 - 97 fL   MCH, POC 30.9 27 - 31.2 pg   MCHC 35.1 31.8 - 35.4 g/dL   RDW, POC 32.3 %   Platelet Count, POC 308 142 - 424 K/uL   MPV 7.7 0 - 99.8 fL   Dg Abd 2 Views  Result Date: 12/12/2016 CLINICAL DATA:  Abdominal pain for 4 days with bloating. EXAM: ABDOMEN - 2 VIEW COMPARISON:  Abdominal CT 09/06/2016 FINDINGS: Normal bowel gas pattern. No abnormal stool retention. No concerning mass effect or calcification. No pneumoperitoneum. An IUD is present with slight deviation to the right a nonspecific finding. Mild lumbar dextrocurvature, greater when standing. No acute osseous finding. Lung bases clear. IMPRESSION: No acute finding. Normal bowel gas pattern. No abnormal stool retention. Electronically Signed   By: Marnee SpringJonathon  Watts M.D.   On: 12/12/2016 09:50    Assessment and Plan :  1. Left lower quadrant pain 2. History of diverticulitis of colon 3. Dysuria - POCT CBC - DG Abd 2 Views; Future - Ambulatory referral to General Surgery - Urine Culture - POCT Microscopic Urinalysis ([facility]) - POCT urinalysis dipstick - Labs are normal. X-ray is negative. Vital signs are wnl. She does not appear in acute distress. PE reveals significant tenderness as described above. Suspect possible diverticulitis flare. Will refer to general surgery Dr. Claudine Moutonodenberg, as she was recently evaluated at this office for similar problem. She declines antibiotics and further imaging at this time.  Advised pt to present to emergency department if her symptoms worsen.  She agrees with plan.  Marco CollieWhitney Feras Gardella, PA-C  [facility] 12/12/2016 9:00 AM

## 2016-12-12 NOTE — Patient Instructions (Addendum)
Your labs look great today. Your x-ray is negative. You should receive a phone call today or tomorrow to schedule an appt. With Dr. Christian Mate. Please come back here or go to the emergency department if your symptoms worsen.    Thank you for coming in today. I hope you feel we met your needs.  Feel free to call PCP if you have any questions or further requests.  Please consider signing up for MyChart if you do not already have it, as this is a great way to communicate with me.  Best,  Whitney McVey, PA-C   IF you received an x-ray today, you will receive an invoice from [facility]. Please contact [facility] at [phone] with questions or concerns regarding your invoice.   IF you received labwork today, you will receive an invoice from [facility]. Please contact [facility] at [phone] with questions or concerns regarding your invoice.   Our billing staff will not be able to assist you with questions regarding bills from these companies.  You will be contacted with the lab results as soon as they are available. The fastest way to get your results is to activate your My Chart account. Instructions are located on the last page of this paperwork. If you have not heard from [place] regarding the results in 2 weeks, please contact this office.

## 2016-12-13 LAB — URINE CULTURE

## 2016-12-19 ENCOUNTER — Ambulatory Visit (INDEPENDENT_AMBULATORY_CARE_PROVIDER_SITE_OTHER): Payer: BLUE CROSS/BLUE SHIELD | Admitting: Family Medicine

## 2016-12-19 VITALS — BP 114/79 | HR 77 | Temp 98.4°F | Ht 64.0 in | Wt 163.0 lb

## 2016-12-19 DIAGNOSIS — E559 Vitamin D deficiency, unspecified: Secondary | ICD-10-CM

## 2016-12-19 DIAGNOSIS — Z6830 Body mass index (BMI) 30.0-30.9, adult: Secondary | ICD-10-CM | POA: Diagnosis not present

## 2016-12-19 DIAGNOSIS — E669 Obesity, unspecified: Secondary | ICD-10-CM | POA: Diagnosis not present

## 2016-12-19 MED ORDER — VITAMIN D (ERGOCALCIFEROL) 1.25 MG (50000 UNIT) PO CAPS: 50000.0000 [IU] | ORAL_CAPSULE | ORAL | 0 refills | Status: DC

## 2016-12-19 NOTE — Progress Notes (Signed)
Office: [phone]  /  Fax: [phone]   HPI:   Chief Complaint: OBESITY Donna [name] is here to discuss her progress with her obesity treatment plan. She is on the  portion control better and make smarter food choices, such as increase vegetables and decrease simple carbohydrates  and is following her eating plan approximately 90 % of the time. She states she is walking the dogs 5 times per week. Donna [name] continues to lose weight even with deviating form her plan. She is still having recurrent gastritis and diverticulosis. Donna [name] is frequently feeling nauseous. She is working on increasing her H2O intake and controlling her portions. Her weight is 163 lb (73.9 kg) today and she has had a weight loss of 2 pounds over a period of 3 weeks since her last visit. She has lost 4 lbs since starting treatment with us.  Vitamin D deficiency Donna [name] has a diagnosis of vitamin D deficiency. She is currently stable on vit D but not yet at goal. She still notes fatigue and denies nausea, vomiting or muscle weakness.   ALLERGIES: Allergies  Allergen Reactions  . Epinephrine Other (See Comments)    Shakes like shes having a seizure  . Augmentin [Amoxicillin-Pot Clavulanate]     States it burns her inside   . Codeine Hives  . Cortisone Swelling  . Levaquin [Levofloxacin In D5w] Other (See Comments)    Blisters on hands  . Omnipred [Prednisolone Acetate] Other (See Comments)    Migraine, emesis  . Sulfa Antibiotics Nausea And Vomiting  . Tequin [Gatifloxacin] Other (See Comments)    Blisters on hands    MEDICATIONS: Current Outpatient Prescriptions on File Prior to Visit  Medication Sig Dispense Refill  . buPROPion (WELLBUTRIN SR) 150 MG 12 hr tablet Take 1 tablet (150 mg total) by mouth daily. 30 tablet 0  . cetirizine (ZYRTEC) 10 MG tablet Take 1 tablet (10 mg total) by mouth daily. 30 tablet 0  . Glucosamine-Chondroitin 1500-1200 MG/30ML LIQD Take 5 mLs by mouth.    . hydrochlorothiazide  (HYDRODIURIL) 25 MG tablet TAKE 1 TABLET BY MOUTH EVERY DAY 90 tablet 0  . levonorgestrel (MIRENA) 20 MCG/24HR IUD 1 each by Intrauterine route once.    Marland Kitchen. omeprazole (PRILOSEC) 10 MG capsule Take 10 mg by mouth daily.    Marland Kitchen. OVER THE COUNTER MEDICATION OTC Fiber well taking    . polyethylene glycol (MIRALAX / GLYCOLAX) packet Take 17 g by mouth daily. Take a half a dose daily    . saccharomyces boulardii (FLORASTOR) 250 MG capsule Take 250 mg by mouth every morning.     No current facility-administered medications on file prior to visit.     PAST MEDICAL HISTORY: Past Medical History:  Diagnosis Date  . Allergy   . Anxiety   . Constipation   . Depression   . Diverticulitis   . Elevated BP without diagnosis of hypertension   . GERD (gastroesophageal reflux disease)   . HTN (hypertension)   . IBS (irritable bowel syndrome)   . Night muscle spasms    back  . Osteoarthritis (arthritis due to wear and tear of joints)    knees  . Ovarian cyst   . Pain in the abdomen    lower left quarter  . Palpitations   . SOB (shortness of breath) on exertion   . Swelling    feet and legs    PAST SURGICAL HISTORY: Past Surgical History:  Procedure Laterality Date  . ABDOMINAL ADHESION SURGERY    .  acl replacement  2000  . ovarian cysts      SOCIAL HISTORY: Social History  Substance Use Topics  . Smoking status: Never Smoker  . Smokeless tobacco: Never Used  . Alcohol use Yes     Comment: occassionally    FAMILY HISTORY: Family History  Problem Relation Age of Onset  . Heart disease Father   . Hypertension Father   . Stroke Father   . Diabetes Father   . Hyperlipidemia Father   . Kidney disease Father   . Sleep apnea Father   . Obesity Father   . Breast cancer Maternal Aunt   . Breast cancer Maternal Grandmother   . Anxiety disorder Mother     ROS: Review of Systems  Constitutional: Positive for malaise/fatigue and weight loss.  Gastrointestinal: Negative for nausea and  vomiting.  Musculoskeletal:       Negative muscle weakness    PHYSICAL EXAM: Blood pressure 114/79, pulse 77, temperature 98.4 F (36.9 C), temperature source Oral, height 5\' 4"  (1.626 m), weight 163 lb (73.9 kg), SpO2 95 %. Body mass index is 27.98 kg/m. Physical Exam  Constitutional: She is oriented to person, place, and time. She appears well-developed and well-nourished.  Cardiovascular: Normal rate.   Pulmonary/Chest: Effort normal.  Musculoskeletal: Normal range of motion.  Neurological: She is oriented to person, place, and time.  Skin: Skin is warm and dry.  Psychiatric: She has a normal mood and affect. Her behavior is normal.  Vitals reviewed.   RECENT LABS AND TESTS: BMET    Component Value Date/Time   NA 139 09/19/2016 1056   K 3.9 09/19/2016 1056   CL 98 09/19/2016 1056   CO2 27 09/19/2016 1056   GLUCOSE 101 (H) 09/19/2016 1056   GLUCOSE 106 (H) 12/11/2015 0945   BUN 12 09/19/2016 1056   CREATININE 0.72 09/19/2016 1056   CALCIUM 9.3 09/19/2016 1056   GFRNONAA 98 09/19/2016 1056   GFRAA 113 09/19/2016 1056   Lab Results  Component Value Date   HGBA1C 5.6 09/19/2016   Lab Results  Component Value Date   INSULIN 8.8 09/19/2016   CBC    Component Value Date/Time   WBC 6.6 12/12/2016 0931   WBC 12.4 (H) 12/11/2015 0945   RBC 4.65 12/12/2016 0931   RBC 4.57 12/11/2015 0945   HGB 14.4 12/12/2016 0931   HGB 14.4 09/19/2016 1056   HCT 41.0 12/12/2016 0931   HCT 42.3 09/19/2016 1056   PLT 340 06/13/2016 1153   MCV 88.2 12/12/2016 0931   MCV 91 09/19/2016 1056   MCH 30.9 12/12/2016 0931   MCH 30.9 12/11/2015 0945   MCHC 35.1 12/12/2016 0931   MCHC 34.0 12/11/2015 0945   RDW 13.2 09/19/2016 1056   LYMPHSABS 1.5 09/19/2016 1056   EOSABS 0.0 09/19/2016 1056   BASOSABS 0.0 09/19/2016 1056   Iron/TIBC/Ferritin/ %Sat No results found for: IRON, TIBC, FERRITIN, IRONPCTSAT Lipid Panel     Component Value Date/Time   CHOL 207 (H) 09/19/2016 1056    TRIG 138 09/19/2016 1056   HDL 41 09/19/2016 1056   CHOLHDL 4.6 (H) 06/13/2016 1153   LDLCALC 138 (H) 09/19/2016 1056   Hepatic Function Panel     Component Value Date/Time   PROT 7.6 09/19/2016 1056   ALBUMIN 4.6 09/19/2016 1056   AST 16 09/19/2016 1056   ALT 22 09/19/2016 1056   ALKPHOS 69 09/19/2016 1056   BILITOT 0.5 09/19/2016 1056      Component Value Date/Time  TSH 1.120 09/19/2016 1056   TSH 1.170 06/13/2016 1153    ASSESSMENT AND PLAN: Vitamin D deficiency - Plan: Vitamin D, Ergocalciferol, (DRISDOL) 50000 units CAPS capsule  Class 1 obesity without serious comorbidity with body mass index (BMI) of 30.0 to 30.9 in adult, unspecified obesity type - Starting BMI greater than 30.  PLAN:  Vitamin D Deficiency Donna [name] was informed that low vitamin D levels contributes to fatigue and are associated with obesity, breast, and colon cancer. She agrees to continue to take prescription Vit D @50 ,000 IU every week, we will refill for 1 month and will re-check labs in 1 month and will follow up for routine testing of vitamin D, at least 2-3 times per year. She was informed of the risk of over-replacement of vitamin D and agrees to not increase her dose unless he discusses this with [place] first. Aaylah agrees to follow up with our clinic in 3 weeks.  Obesity Jazzalynn is currently in the action stage of change. As such, her goal is to continue with weight loss efforts She has agreed to keep a food journal with 1200 calories and 70+ grams of protein  Lynnann has been instructed to work up to a goal of 150 minutes of combined cardio and strengthening exercise per week for weight loss and overall health benefits. We discussed the following Behavioral Modification Strategies today: increase H2O intake, increasing lean protein intake and decreasing simple carbohydrates   Yaritzy has agreed to follow up with our clinic in 3 weeks. She was informed of the importance of frequent follow up  visits to maximize her success with intensive lifestyle modifications for her multiple health conditions.  I, [place] Crane, am acting as transcriptionist for Quillian Quince, MD  I have reviewed the above documentation for accuracy and completeness, and I agree with the above. -Quillian Quince, MD  OBESITY BEHAVIORAL INTERVENTION VISIT  Today's visit was # 6 out of 22.  Starting weight: 167 lbs Starting date: 09/19/16 Today's weight : 163 lbs Today's date: 12/19/2016 Total lbs lost to date: 4 (Patients must lose 7 lbs in the first 6 months to continue with counseling)   ASK: We discussed the diagnosis of obesity with Donna [name] today and Donna [name] agreed to give [place] permission to discuss obesity behavioral modification therapy today.  ASSESS: Donna [name] has the diagnosis of obesity and her BMI today is 49 Donna [name] is in the action stage of change   ADVISE: Donna [name] was educated on the multiple health risks of obesity as well as the benefit of weight loss to improve her health. She was advised of the need for long term treatment and the importance of lifestyle modifications.  AGREE: Multiple dietary modification options and treatment options were discussed and  Donna [name] agreed to keep a food journal with 1200 calories and 70+ grams of protein  We discussed the following Behavioral Modification Strategies today: increase H2O intake, increasing lean protein intake and decreasing simple carbohydrates

## 2016-12-26 DIAGNOSIS — R1084 Generalized abdominal pain: Secondary | ICD-10-CM | POA: Diagnosis not present

## 2016-12-26 DIAGNOSIS — K589 Irritable bowel syndrome without diarrhea: Secondary | ICD-10-CM | POA: Diagnosis not present

## 2017-01-10 ENCOUNTER — Ambulatory Visit (INDEPENDENT_AMBULATORY_CARE_PROVIDER_SITE_OTHER): Payer: BLUE CROSS/BLUE SHIELD | Admitting: Family Medicine

## 2017-01-10 VITALS — BP 126/85 | HR 75 | Temp 98.4°F | Ht 64.0 in | Wt 163.0 lb

## 2017-01-10 DIAGNOSIS — Z6830 Body mass index (BMI) 30.0-30.9, adult: Secondary | ICD-10-CM

## 2017-01-10 DIAGNOSIS — E669 Obesity, unspecified: Secondary | ICD-10-CM

## 2017-01-10 DIAGNOSIS — E559 Vitamin D deficiency, unspecified: Secondary | ICD-10-CM

## 2017-01-10 MED ORDER — VITAMIN D (ERGOCALCIFEROL) 1.25 MG (50000 UNIT) PO CAPS: 50000.0000 [IU] | ORAL_CAPSULE | ORAL | 0 refills | Status: DC

## 2017-01-11 NOTE — Progress Notes (Signed)
Office: [phone]  /  Fax: [phone]   HPI:   Chief Complaint: OBESITY Donna [name] is here to discuss her progress with her obesity treatment plan. She is on the Vegetarian plan and is following her eating plan approximately 85 % of the time. She states she is exercising 0 minutes 0 times per week. Donna [name] has done well maintaining her weight over the labor day weekend and she is ready to get back on track.  Her weight is 163 lb (73.9 kg) today and has not lost weight since her last visit. She has lost 4 lbs since starting treatment with [place].  Vitamin D deficiency Donna [name] has a diagnosis of vitamin D deficiency. She is stable on prescription Vit D but not yet at goal. Donna [name] denies nausea, vomiting or muscle weakness.  ALLERGIES: Allergies  Allergen Reactions  . Epinephrine Other (See Comments)    Shakes like shes having a seizure  . Augmentin [Amoxicillin-Pot Clavulanate]     States it burns her inside   . Codeine Hives  . Cortisone Swelling  . Levaquin [Levofloxacin In D5w] Other (See Comments)    Blisters on hands  . Omnipred [Prednisolone Acetate] Other (See Comments)    Migraine, emesis  . Sulfa Antibiotics Nausea And Vomiting  . Tequin [Gatifloxacin] Other (See Comments)    Blisters on hands    MEDICATIONS: Current Outpatient Prescriptions on File Prior to Visit  Medication Sig Dispense Refill  . levonorgestrel (MIRENA) 20 MCG/24HR IUD 1 each by Intrauterine route once.    Marland Kitchen omeprazole (PRILOSEC) 10 MG capsule Take 10 mg by mouth daily.    Marland Kitchen OVER THE COUNTER MEDICATION OTC Fiber well taking     No current facility-administered medications on file prior to visit.     PAST MEDICAL HISTORY: Past Medical History:  Diagnosis Date  . Allergy   . Anxiety   . Constipation   . Depression   . Diverticulitis   . Elevated BP without diagnosis of hypertension   . GERD (gastroesophageal reflux disease)   . HTN (hypertension)   . IBS (irritable bowel syndrome)   .  Night muscle spasms    back  . Osteoarthritis (arthritis due to wear and tear of joints)    knees  . Ovarian cyst   . Pain in the abdomen    lower left quarter  . Palpitations   . SOB (shortness of breath) on exertion   . Swelling    feet and legs    PAST SURGICAL HISTORY: Past Surgical History:  Procedure Laterality Date  . ABDOMINAL ADHESION SURGERY    . acl replacement  2000  . ovarian cysts      SOCIAL HISTORY: Social History  Substance Use Topics  . Smoking status: Never Smoker  . Smokeless tobacco: Never Used  . Alcohol use Yes     Comment: occassionally    FAMILY HISTORY: Family History  Problem Relation Age of Onset  . Heart disease Father   . Hypertension Father   . Stroke Father   . Diabetes Father   . Hyperlipidemia Father   . Kidney disease Father   . Sleep apnea Father   . Obesity Father   . Breast cancer Maternal Aunt   . Breast cancer Maternal Grandmother   . Anxiety disorder Mother     ROS: Review of Systems  Constitutional: Negative for weight loss.  Gastrointestinal: Negative for nausea and vomiting.  Musculoskeletal:       Negative muscle weakness  PHYSICAL EXAM: Blood pressure 126/85, pulse 75, temperature 98.4 F (36.9 C), temperature source Oral, height 5\' 4"  (1.626 m), weight 163 lb (73.9 kg), SpO2 98 %. Body mass index is 27.98 kg/m. Physical Exam  Constitutional: She is oriented to person, place, and time. She appears well-developed and well-nourished.  Cardiovascular: Normal rate.   Pulmonary/Chest: Effort normal.  Musculoskeletal: Normal range of motion.  Neurological: She is oriented to person, place, and time.  Skin: Skin is warm and dry.  Psychiatric: She has a normal mood and affect. Her behavior is normal.  Vitals reviewed.   RECENT LABS AND TESTS: BMET    Component Value Date/Time   NA 139 09/19/2016 1056   K 3.9 09/19/2016 1056   CL 98 09/19/2016 1056   CO2 27 09/19/2016 1056   GLUCOSE 101 (H) 09/19/2016  1056   GLUCOSE 106 (H) 12/11/2015 0945   BUN 12 09/19/2016 1056   CREATININE 0.72 09/19/2016 1056   CALCIUM 9.3 09/19/2016 1056   GFRNONAA 98 09/19/2016 1056   GFRAA 113 09/19/2016 1056   Lab Results  Component Value Date   HGBA1C 5.6 09/19/2016   Lab Results  Component Value Date   INSULIN 8.8 09/19/2016   CBC    Component Value Date/Time   WBC 6.6 12/12/2016 0931   WBC 12.4 (H) 12/11/2015 0945   RBC 4.65 12/12/2016 0931   RBC 4.57 12/11/2015 0945   HGB 14.4 12/12/2016 0931   HGB 14.4 09/19/2016 1056   HCT 41.0 12/12/2016 0931   HCT 42.3 09/19/2016 1056   PLT 340 06/13/2016 1153   MCV 88.2 12/12/2016 0931   MCV 91 09/19/2016 1056   MCH 30.9 12/12/2016 0931   MCH 30.9 12/11/2015 0945   MCHC 35.1 12/12/2016 0931   MCHC 34.0 12/11/2015 0945   RDW 13.2 09/19/2016 1056   LYMPHSABS 1.5 09/19/2016 1056   EOSABS 0.0 09/19/2016 1056   BASOSABS 0.0 09/19/2016 1056   Iron/TIBC/Ferritin/ %Sat No results found for: IRON, TIBC, FERRITIN, IRONPCTSAT Lipid Panel     Component Value Date/Time   CHOL 207 (H) 09/19/2016 1056   TRIG 138 09/19/2016 1056   HDL 41 09/19/2016 1056   CHOLHDL 4.6 (H) 06/13/2016 1153   LDLCALC 138 (H) 09/19/2016 1056   Hepatic Function Panel     Component Value Date/Time   PROT 7.6 09/19/2016 1056   ALBUMIN 4.6 09/19/2016 1056   AST 16 09/19/2016 1056   ALT 22 09/19/2016 1056   ALKPHOS 69 09/19/2016 1056   BILITOT 0.5 09/19/2016 1056      Component Value Date/Time   TSH 1.120 09/19/2016 1056   TSH 1.170 06/13/2016 1153    ASSESSMENT AND PLAN: Vitamin D deficiency - Plan: Vitamin D, Ergocalciferol, (DRISDOL) 50000 units CAPS capsule  Class 1 obesity with serious comorbidity and body mass index (BMI) of 30.0 to 30.9 in adult, unspecified obesity type - Starting BMI greater than 30  PLAN:  Vitamin D Deficiency Donna [name] was informed that low vitamin D levels contributes to fatigue and are associated with obesity, breast, and colon cancer.  She agrees to continue taking prescription Vit D @50 ,000 IU every week and will follow up for routine testing of vitamin D, at least 2-3 times per year. She was informed of the risk of over-replacement of vitamin D and agrees to not increase her dose unless he discusses this with [place] first. We will refill Vit D for 1 month and Donna [name] agrees to follow up with our clinic in 3 weeks, we  will recheck labs at that time.  Obesity Donna [name] is currently in the action stage of change. As such, her goal is to continue with weight loss efforts She has agreed to follow our keeping a food journal with 1200 calories and 70 + protein plan  Donna [name] has been instructed to work up to a goal of 150 minutes of combined cardio and strengthening exercise per week for weight loss and overall health benefits. We discussed the following Behavioral Modification Strategies today: increasing lean protein intake, decreasing simple carbohydrates, travel eating strategies, and keep a strict food journal.   Donna [name] has agreed to follow up with our clinic in 3 weeks. She was informed of the importance of frequent follow up visits to maximize her success with intensive lifestyle modifications for her multiple health conditions.  I, Burt KnackSharon Brently Voorhis, am acting as transcriptionist for Quillian Quincearen Beasley, MD  I have reviewed the above documentation for accuracy and completeness, and I agree with the above. -Quillian Quincearen Beasley, MD     OBESITY BEHAVIORAL INTERVENTION VISIT  Today's visit was # 7 out of 22.  Starting weight: 167 lbs Starting date: 09/19/16 Today's weight: 163 lbs  Today's date: 01/10/17 Total lbs lost to date: 4 (Patients must lose 7 lbs in the first 6 months to continue with counseling)   ASK: We discussed the diagnosis of obesity with Donna [name] today and Donna [name] agreed to give us permission to discuss obesity behavioral modification therapy today.  ASSESS: Donna [name] has the diagnosis of obesity and her BMI today  is 4827 Donna [name] is in the action stage of change   ADVISE: Donna [name] was educated on the multiple health risks of obesity as well as the benefit of weight loss to improve her health. She was advised of the need for long term treatment and the importance of lifestyle modifications.  AGREE: Multiple dietary modification options and treatment options were discussed and  Donna [name] agreed to keep a food journal with 1200 calories and 70+ protein  We discussed the following Behavioral Modification Strategies today: increasing lean protein intake, decreasing simple carbohydrates, travel eating strategies, and keep a strict food journal.

## 2017-02-01 ENCOUNTER — Ambulatory Visit (INDEPENDENT_AMBULATORY_CARE_PROVIDER_SITE_OTHER): Payer: BLUE CROSS/BLUE SHIELD | Admitting: Family Medicine

## 2017-02-01 VITALS — BP 112/78 | HR 67 | Temp 97.7°F | Ht 64.0 in | Wt 162.0 lb

## 2017-02-01 DIAGNOSIS — L659 Nonscarring hair loss, unspecified: Secondary | ICD-10-CM | POA: Diagnosis not present

## 2017-02-01 DIAGNOSIS — E784 Other hyperlipidemia: Secondary | ICD-10-CM

## 2017-02-01 DIAGNOSIS — E559 Vitamin D deficiency, unspecified: Secondary | ICD-10-CM

## 2017-02-01 DIAGNOSIS — R7303 Prediabetes: Secondary | ICD-10-CM | POA: Diagnosis not present

## 2017-02-01 DIAGNOSIS — E669 Obesity, unspecified: Secondary | ICD-10-CM

## 2017-02-01 DIAGNOSIS — Z6830 Body mass index (BMI) 30.0-30.9, adult: Secondary | ICD-10-CM | POA: Diagnosis not present

## 2017-02-01 DIAGNOSIS — E7849 Other hyperlipidemia: Secondary | ICD-10-CM

## 2017-02-01 MED ORDER — VITAMIN D (ERGOCALCIFEROL) 1.25 MG (50000 UNIT) PO CAPS: 50000.0000 [IU] | ORAL_CAPSULE | ORAL | 0 refills | Status: DC

## 2017-02-01 NOTE — Progress Notes (Signed)
Office: [phone]  /  Fax: [phone]   HPI:   Chief Complaint: OBESITY Donna [name] is here to discuss her progress with her obesity treatment plan. She is on the keep a food journal with 1200 calories and 70+ grams of protein daily and is following her eating plan approximately 70 % of the time. She states she is walking for 40 minutes 7 times per week. Donna [name] is not following her plan closely and is not likely eating enough protein, but she is trying to increase her protein level. Donna [name] states she doesn't have enough time to eat all her meals. Her weight is 162 lb (73.5 kg) today and has had a weight loss of 1 pound over a period of 3 weeks since her last visit. She has lost 5 lbs since starting treatment with [place].  Vitamin D deficiency Donna [name] has a diagnosis of vitamin D deficiency. She is currently taking prescription vit D and is due for labs. She denies nausea, vomiting or muscle weakness.  Pre-Diabetes Donna [name] has a diagnosis of pre-diabetes based on her elevated Hgb A1c and was informed this puts her at greater risk of developing diabetes. She is not taking metformin currently and continues to work on diet and exercise to control her pre-diabetes and decrease the risk of diabetes. She denies nausea or hypoglycemia.  Hyperlipidemia Donna [name] has hyperlipidemia and has been attempting to control her cholesterol levels with intensive lifestyle modification including a low saturated fat diet, exercise and weight loss. She denies any chest pain, claudication or myalgias.  Hair Loss Unexplained Donna [name] has unexplained hair loss for the past 2 months and notes she feels strand loss and breakage. She denies patchy hair loss.   ALLERGIES: Allergies  Allergen Reactions   Epinephrine Other (See Comments)    Shakes like shes having a seizure   Augmentin [Amoxicillin-Pot Clavulanate]     States it burns her inside    Codeine Hives   Cortisone Swelling   Levaquin [Levofloxacin  In D5w] Other (See Comments)    Blisters on hands   Omnipred [Prednisolone Acetate] Other (See Comments)    Migraine, emesis   Sulfa Antibiotics Nausea And Vomiting   Tequin [Gatifloxacin] Other (See Comments)    Blisters on hands    MEDICATIONS: Current Outpatient Prescriptions on File Prior to Visit  Medication Sig Dispense Refill   levonorgestrel (MIRENA) 20 MCG/24HR IUD 1 each by Intrauterine route once.     omeprazole (PRILOSEC) 10 MG capsule Take 10 mg by mouth daily.     OVER THE COUNTER MEDICATION OTC Fiber well taking     No current facility-administered medications on file prior to visit.     PAST MEDICAL HISTORY: Past Medical History:  Diagnosis Date   Allergy    Anxiety    Constipation    Depression    Diverticulitis    Elevated BP without diagnosis of hypertension    GERD (gastroesophageal reflux disease)    HTN (hypertension)    IBS (irritable bowel syndrome)    Night muscle spasms    back   Osteoarthritis (arthritis due to wear and tear of joints)    knees   Ovarian cyst    Pain in the abdomen    lower left quarter   Palpitations    SOB (shortness of breath) on exertion    Swelling    feet and legs    PAST SURGICAL HISTORY: Past Surgical History:  Procedure Laterality Date   ABDOMINAL ADHESION SURGERY  acl replacement  2000   ovarian cysts      SOCIAL HISTORY: Social History  Substance Use Topics   Smoking status: Never Smoker   Smokeless tobacco: Never Used   Alcohol use Yes     Comment: occassionally    FAMILY HISTORY: Family History  Problem Relation Age of Onset   Heart disease Father    Hypertension Father    Stroke Father    Diabetes Father    Hyperlipidemia Father    Kidney disease Father    Sleep apnea Father    Obesity Father    Breast cancer Maternal Aunt    Breast cancer Maternal Grandmother    Anxiety disorder Mother     ROS: Review of Systems  Constitutional: Positive  for weight loss.       Positive hair loss  Cardiovascular: Negative for chest pain and claudication.  Gastrointestinal: Negative for nausea and vomiting.  Musculoskeletal: Negative for myalgias.       Negative muscle weakness  Endo/Heme/Allergies:       Negative hypoglycemia    PHYSICAL EXAM: Blood pressure 112/78, pulse 67, temperature 97.7 F (36.5 C), temperature source Oral, height  (1.626 m), weight 162 lb (73.5 kg), SpO2 97 %. Body mass index is 27.81 kg/m. Physical Exam  Constitutional: She is oriented to person, place, and time. She appears well-developed and well-nourished.  Cardiovascular: Normal rate.   Pulmonary/Chest: Effort normal.  Musculoskeletal: Normal range of motion.  Neurological: She is oriented to person, place, and time.  Skin: Skin is warm and dry.  Psychiatric: She has a normal mood and affect. Her behavior is normal.  Vitals reviewed.   RECENT LABS AND TESTS: BMET    Component Value Date/Time   NA 139 09/19/2016 1056   K 3.9 09/19/2016 1056   CL 98 09/19/2016 1056   CO2 27 09/19/2016 1056   GLUCOSE 101 (H) 09/19/2016 1056   GLUCOSE 106 (H) 12/11/2015 0945   BUN 12 09/19/2016 1056   CREATININE 0.72 09/19/2016 1056   CALCIUM 9.3 09/19/2016 1056   GFRNONAA 98 09/19/2016 1056   GFRAA 113 09/19/2016 1056   Lab Results  Component Value Date   HGBA1C 5.6 09/19/2016   Lab Results  Component Value Date   INSULIN 8.8 09/19/2016   CBC    Component Value Date/Time   WBC 6.6 12/12/2016 0931   WBC 12.4 (H) 12/11/2015 0945   RBC 4.65 12/12/2016 0931   RBC 4.57 12/11/2015 0945   HGB 14.4 12/12/2016 0931   HGB 14.4 09/19/2016 1056   HCT 41.0 12/12/2016 0931   HCT 42.3 09/19/2016 1056   PLT 340 06/13/2016 1153   MCV 88.2 12/12/2016 0931   MCV 91 09/19/2016 1056   MCH 30.9 12/12/2016 0931   MCH 30.9 12/11/2015 0945   MCHC 35.1 12/12/2016 0931   MCHC 34.0 12/11/2015 0945   RDW 13.2 09/19/2016 1056   LYMPHSABS 1.5 09/19/2016 1056    EOSABS 0.0 09/19/2016 1056   BASOSABS 0.0 09/19/2016 1056   Iron/TIBC/Ferritin/ %Sat No results found for: IRON, TIBC, FERRITIN, IRONPCTSAT Lipid Panel     Component Value Date/Time   CHOL 207 (H) 09/19/2016 1056   TRIG 138 09/19/2016 1056   HDL 41 09/19/2016 1056   CHOLHDL 4.6 (H) 06/13/2016 1153   LDLCALC 138 (H) 09/19/2016 1056   Hepatic Function Panel     Component Value Date/Time   PROT 7.6 09/19/2016 1056   ALBUMIN 4.6 09/19/2016 1056   AST 16 09/19/2016 1056  ALT 22 09/19/2016 1056   ALKPHOS 69 09/19/2016 1056   BILITOT 0.5 09/19/2016 1056      Component Value Date/Time   TSH 1.120 09/19/2016 1056   TSH 1.170 06/13/2016 1153    ASSESSMENT AND PLAN: Vitamin D deficiency - Plan: VITAMIN D 25 Hydroxy (Vit-D Deficiency, Fractures), Vitamin D, Ergocalciferol, (DRISDOL) 50000 units CAPS capsule  Prediabetes - Plan: Vitamin B12, CBC With Differential, Comprehensive metabolic panel, Folate, Hemoglobin A1c, Insulin, random, T3, T4, free, TSH  Hair loss - Plan: T3, T4, free, TSH, Ambulatory referral to Dermatology  Other hyperlipidemia - Plan: Lipid Panel With LDL/HDL Ratio  Class 1 obesity with serious comorbidity and body mass index (BMI) of 30.0 to 30.9 in adult, unspecified obesity type - Starting BMI greater than 30  PLAN:  Vitamin D Deficiency Donna [name] was informed that low vitamin D levels contributes to fatigue and are associated with obesity, breast, and colon cancer. She agrees to continue to take prescription Vit D ,000 IU every week, we will refill for 1 month and will check labs and will follow up for routine testing of vitamin D, at least 2-3 times per year. She was informed of the risk of over-replacement of vitamin D and agrees to not increase her dose unless he discusses this with [place] first. Donna [name] agrees to follow up with our clinic in 3 weeks.  Pre-Diabetes Donna [name] will continue to work on weight loss, exercise, and decreasing simple carbohydrates in  her diet to help decrease the risk of diabetes. She was informed that eating too many simple carbohydrates or too many calories at one sitting increases the likelihood of GI side effects. We will check labs and Donna [name] agreed to follow up with [place] as directed to monitor her progress.  Hyperlipidemia Donna [name] was informed of the American Heart Association Guidelines emphasizing intensive lifestyle modifications as the first line treatment for hyperlipidemia. We discussed many lifestyle modifications today in depth, and Donna [name] will continue to work on decreasing saturated fats such as fatty red meat, butter and many fried foods. She will also increase vegetables and lean protein in her diet and continue to work on exercise and weight loss efforts. We will check labs and Donna [name] agrees to follow up with our clinic in 3 weeks.  Hair Loss Unexplained We will check labs and refer Donna [name] to Dermatology.  Obesity Donna [name] is currently in the action stage of change. As such, her goal is to continue with weight loss efforts She has agreed to portion control better and make smarter food choices, such as increase vegetables and decrease simple carbohydrates  Donna [name] has been instructed to work up to a goal of 150 minutes of combined cardio and strengthening exercise per week for weight loss and overall health benefits. We discussed the following Behavioral Modification Strategies today: increasing lean protein intake and decreasing simple carbohydrates   Donna [name] has agreed to follow up with our clinic in 3 weeks. She was informed of the importance of frequent follow up visits to maximize her success with intensive lifestyle modifications for her multiple health conditions.  I, [place] Crane, am acting as transcriptionist for Quillian Quince, MD  I have reviewed the above documentation for accuracy and completeness, and I agree with the above. -Quillian Quince, MD    OBESITY BEHAVIORAL INTERVENTION  VISIT  Today's visit was # 8 out of 22.  Starting weight: 167 lbs Starting date: 09/19/16 Today's weight : 162 lbs Today's date: 02/01/2017 Total lbs lost to date: 5 (Patients must lose  7 lbs in the first 6 months to continue with counseling)   ASK: We discussed the diagnosis of obesity with Donna [name] today and Donna [name] agreed to give [place] permission to discuss obesity behavioral modification therapy today.  ASSESS: Donna [name] has the diagnosis of obesity and her BMI today is 27.79 Donna [name] is in the action stage of change   ADVISE: Donna [name] was educated on the multiple health risks of obesity as well as the benefit of weight loss to improve her health. She was advised of the need for long term treatment and the importance of lifestyle modifications.  AGREE: Multiple dietary modification options and treatment options were discussed and  Donna [name] agreed to portion control better and make smarter food choices, such as increase vegetables and decrease simple carbohydrates  We discussed the following Behavioral Modification Strategies today: increasing lean protein intake and decreasing simple carbohydrates

## 2017-02-02 LAB — CBC WITH DIFFERENTIAL
BASOS ABS: 0 10*3/uL (ref 0.0–0.2)
Basos: 0 %
EOS (ABSOLUTE): 0.1 10*3/uL (ref 0.0–0.4)
Eos: 1 %
HEMOGLOBIN: 14.6 g/dL (ref 11.1–15.9)
Hematocrit: 41.1 % (ref 34.0–46.6)
IMMATURE GRANS (ABS): 0 10*3/uL (ref 0.0–0.1)
IMMATURE GRANULOCYTES: 0 %
LYMPHS ABS: 2 10*3/uL (ref 0.7–3.1)
LYMPHS: 29 %
MCH: 31.3 pg (ref 26.6–33.0)
MCHC: 35.5 g/dL (ref 31.5–35.7)
MCV: 88 fL (ref 79–97)
MONOCYTES: 8 %
Monocytes Absolute: 0.5 10*3/uL (ref 0.1–0.9)
NEUTROS PCT: 62 %
Neutrophils Absolute: 4.4 10*3/uL (ref 1.4–7.0)
RBC: 4.66 x10E6/uL (ref 3.77–5.28)
RDW: 13.3 % (ref 12.3–15.4)
WBC: 7.1 10*3/uL (ref 3.4–10.8)

## 2017-02-02 LAB — COMPREHENSIVE METABOLIC PANEL
ALT: 14 IU/L (ref 0–32)
AST: 15 IU/L (ref 0–40)
Albumin/Globulin Ratio: 1.9 (ref 1.2–2.2)
Albumin: 4.7 g/dL (ref 3.5–5.5)
Alkaline Phosphatase: 59 IU/L (ref 39–117)
BUN/Creatinine Ratio: 18 (ref 9–23)
BUN: 17 mg/dL (ref 6–24)
Bilirubin Total: 0.3 mg/dL (ref 0.0–1.2)
CALCIUM: 9.4 mg/dL (ref 8.7–10.2)
CO2: 24 mmol/L (ref 20–29)
CREATININE: 0.92 mg/dL (ref 0.57–1.00)
Chloride: 102 mmol/L (ref 96–106)
GFR calc Af Amer: 84 mL/min/{1.73_m2} (ref >59)
GFR, EST NON AFRICAN AMERICAN: 73 mL/min/{1.73_m2} (ref >59)
GLUCOSE: 96 mg/dL (ref 65–99)
Globulin, Total: 2.5 g/dL (ref 1.5–4.5)
Potassium: 4.4 mmol/L (ref 3.5–5.2)
Sodium: 139 mmol/L (ref 134–144)
Total Protein: 7.2 g/dL (ref 6.0–8.5)

## 2017-02-02 LAB — FOLATE: Folate: 17.2 ng/mL (ref >3.0)

## 2017-02-02 LAB — T3: T3 TOTAL: 119 ng/dL (ref 71–180)

## 2017-02-02 LAB — INSULIN, RANDOM: INSULIN: 10.8 u[IU]/mL (ref 2.6–24.9)

## 2017-02-02 LAB — T4, FREE: Free T4: 1.34 ng/dL (ref 0.82–1.77)

## 2017-02-02 LAB — LIPID PANEL WITH LDL/HDL RATIO
Cholesterol, Total: 187 mg/dL (ref 100–199)
HDL: 42 mg/dL (ref >39)
LDL CALC: 126 mg/dL — AB (ref 0–99)
LDL/HDL RATIO: 3 ratio (ref 0.0–3.2)
Triglycerides: 93 mg/dL (ref 0–149)
VLDL CHOLESTEROL CAL: 19 mg/dL (ref 5–40)

## 2017-02-02 LAB — VITAMIN D 25 HYDROXY (VIT D DEFICIENCY, FRACTURES): Vit D, 25-Hydroxy: 50.5 ng/mL (ref 30.0–100.0)

## 2017-02-02 LAB — HEMOGLOBIN A1C
Est. average glucose Bld gHb Est-mCnc: 111 mg/dL
Hgb A1c MFr Bld: 5.5 % (ref 4.8–5.6)

## 2017-02-02 LAB — TSH: TSH: 1.72 u[IU]/mL (ref 0.450–4.500)

## 2017-02-02 LAB — VITAMIN B12: VITAMIN B 12: 421 pg/mL (ref 232–1245)

## 2017-02-22 ENCOUNTER — Ambulatory Visit (INDEPENDENT_AMBULATORY_CARE_PROVIDER_SITE_OTHER): Payer: BLUE CROSS/BLUE SHIELD | Admitting: Physician Assistant

## 2017-02-22 VITALS — BP 112/75 | HR 80 | Temp 97.7°F | Ht 64.0 in | Wt 163.0 lb

## 2017-02-22 DIAGNOSIS — E7849 Other hyperlipidemia: Secondary | ICD-10-CM

## 2017-02-22 DIAGNOSIS — E559 Vitamin D deficiency, unspecified: Secondary | ICD-10-CM

## 2017-02-22 DIAGNOSIS — Z6830 Body mass index (BMI) 30.0-30.9, adult: Secondary | ICD-10-CM

## 2017-02-22 DIAGNOSIS — E669 Obesity, unspecified: Secondary | ICD-10-CM

## 2017-02-22 MED ORDER — VITAMIN D (ERGOCALCIFEROL) 1.25 MG (50000 UNIT) PO CAPS: 50000.0000 [IU] | ORAL_CAPSULE | ORAL | 0 refills | Status: DC

## 2017-02-23 NOTE — Progress Notes (Signed)
Office: [phone]  /  Fax: [phone]   HPI:   Chief Complaint: OBESITY Donna [name] is here to discuss her progress with her obesity treatment plan. She is on the protein rich vegetarian plan and is following her eating plan approximately 80 % of the time. She states she is exercising 0 minutes 0 times per week. [name] was on vacation and ate out more. She continues to plan ahead her meals well but does not meet the protein requirement on the meal plan.  Her weight is 163 lb (73.9 kg) today and has gained 1 pound since her last visit. She has lost 4 lbs since starting treatment with [place].  Vitamin D deficiency Donna [name] has a diagnosis of vitamin D deficiency. She is currently taking prescription Vit D and denies nausea, vomiting or muscle weakness.  Hyperlipidemia Donna [name] has hyperlipidemia and has been trying to improve her cholesterol levels with intensive lifestyle modification including a low saturated fat diet, exercise and weight loss. Her LDL has improved but yet at goal. She is not on statin and declined medication today.  She denies any chest pain, claudication or myalgias.  ALLERGIES: Allergies  Allergen Reactions  . Epinephrine Other (See Comments)    Shakes like shes having a seizure  . Augmentin [Amoxicillin-Pot Clavulanate]     States it burns her inside   . Codeine Hives  . Cortisone Swelling  . Levaquin [Levofloxacin In D5w] Other (See Comments)    Blisters on hands  . Omnipred [Prednisolone Acetate] Other (See Comments)    Migraine, emesis  . Sulfa Antibiotics Nausea And Vomiting  . Tequin [Gatifloxacin] Other (See Comments)    Blisters on hands    MEDICATIONS: Current Outpatient Prescriptions on File Prior to Visit  Medication Sig Dispense Refill  . levonorgestrel (MIRENA) 20 MCG/24HR IUD 1 each by Intrauterine route once.    Marland Kitchen omeprazole (PRILOSEC) 10 MG capsule Take 10 mg by mouth daily.    Marland Kitchen OVER THE COUNTER MEDICATION OTC Fiber well taking     No  current facility-administered medications on file prior to visit.     PAST MEDICAL HISTORY: Past Medical History:  Diagnosis Date  . Allergy   . Anxiety   . Constipation   . Depression   . Diverticulitis   . Elevated BP without diagnosis of hypertension   . GERD (gastroesophageal reflux disease)   . HTN (hypertension)   . IBS (irritable bowel syndrome)   . Night muscle spasms    back  . Osteoarthritis (arthritis due to wear and tear of joints)    knees  . Ovarian cyst   . Pain in the abdomen    lower left quarter  . Palpitations   . SOB (shortness of breath) on exertion   . Swelling    feet and legs    PAST SURGICAL HISTORY: Past Surgical History:  Procedure Laterality Date  . ABDOMINAL ADHESION SURGERY    . acl replacement  2000  . ovarian cysts      SOCIAL HISTORY: Social History  Substance Use Topics  . Smoking status: Never Smoker  . Smokeless tobacco: Never Used  . Alcohol use Yes     Comment: occassionally    FAMILY HISTORY: Family History  Problem Relation Age of Onset  . Heart disease Father   . Hypertension Father   . Stroke Father   . Diabetes Father   . Hyperlipidemia Father   . Kidney disease Father   . Sleep apnea Father   .  Obesity Father   . Breast cancer Maternal Aunt   . Breast cancer Maternal Grandmother   . Anxiety disorder Mother     ROS: Review of Systems  Constitutional: Negative for weight loss.  Cardiovascular: Negative for chest pain and claudication.  Gastrointestinal: Negative for nausea and vomiting.  Musculoskeletal: Negative for myalgias.       Negative muscle weakness    PHYSICAL EXAM: Blood pressure 112/75, pulse 80, temperature 97.7 F (36.5 C), temperature source Oral, height 5\' 4"  (1.626 m), weight 163 lb (73.9 kg), SpO2 98 %. Body mass index is 27.98 kg/m. Physical Exam  Constitutional: She is oriented to person, place, and time. She appears well-developed and well-nourished.  Cardiovascular: Normal rate.    Pulmonary/Chest: Effort normal.  Musculoskeletal: Normal range of motion.  Neurological: She is oriented to person, place, and time.  Skin: Skin is warm and dry.  Psychiatric: She has a normal mood and affect. Her behavior is normal.  Vitals reviewed.   RECENT LABS AND TESTS: BMET    Component Value Date/Time   NA 139 02/01/2017 0843   K 4.4 02/01/2017 0843   CL 102 02/01/2017 0843   CO2 24 02/01/2017 0843   GLUCOSE 96 02/01/2017 0843   GLUCOSE 106 (H) 12/11/2015 0945   BUN 17 02/01/2017 0843   CREATININE 0.92 02/01/2017 0843   CALCIUM 9.4 02/01/2017 0843   GFRNONAA 73 02/01/2017 0843   GFRAA 84 02/01/2017 0843   Lab Results  Component Value Date   HGBA1C 5.5 02/01/2017   HGBA1C 5.6 09/19/2016   Lab Results  Component Value Date   INSULIN 10.8 02/01/2017   INSULIN 8.8 09/19/2016   CBC    Component Value Date/Time   WBC 7.1 02/01/2017 0843   WBC 6.6 12/12/2016 0931   WBC 12.4 (H) 12/11/2015 0945   RBC 4.66 02/01/2017 0843   RBC 4.65 12/12/2016 0931   RBC 4.57 12/11/2015 0945   HGB 14.6 02/01/2017 0843   HCT 41.1 02/01/2017 0843   PLT 340 06/13/2016 1153   MCV 88 02/01/2017 0843   MCH 31.3 02/01/2017 0843   MCH 30.9 12/12/2016 0931   MCH 30.9 12/11/2015 0945   MCHC 35.5 02/01/2017 0843   MCHC 35.1 12/12/2016 0931   MCHC 34.0 12/11/2015 0945   RDW 13.3 02/01/2017 0843   LYMPHSABS 2.0 02/01/2017 0843   EOSABS 0.1 02/01/2017 0843   BASOSABS 0.0 02/01/2017 0843   Iron/TIBC/Ferritin/ %Sat No results found for: IRON, TIBC, FERRITIN, IRONPCTSAT Lipid Panel     Component Value Date/Time   CHOL 187 02/01/2017 0843   TRIG 93 02/01/2017 0843   HDL 42 02/01/2017 0843   CHOLHDL 4.6 (H) 06/13/2016 1153   LDLCALC 126 (H) 02/01/2017 0843   Hepatic Function Panel     Component Value Date/Time   PROT 7.2 02/01/2017 0843   ALBUMIN 4.7 02/01/2017 0843   AST 15 02/01/2017 0843   ALT 14 02/01/2017 0843   ALKPHOS 59 02/01/2017 0843   BILITOT 0.3 02/01/2017 0843        Component Value Date/Time   TSH 1.720 02/01/2017 0843   TSH 1.120 09/19/2016 1056   TSH 1.170 06/13/2016 1153    ASSESSMENT AND PLAN: Vitamin D deficiency - Plan: Vitamin D, Ergocalciferol, (DRISDOL) 50000 units CAPS capsule  Other hyperlipidemia  Class 1 obesity with serious comorbidity and body mass index (BMI) of 30.0 to 30.9 in adult, unspecified obesity type - starting BMI greater then 30  PLAN:  Vitamin D Deficiency Donna [name] was  informed that low vitamin D levels contributes to fatigue and are associated with obesity, breast, and colon cancer. She agrees to continue taking prescription Vit D @50 ,000 IU every week #4 and we will refill for 1 month. She will follow up for routine testing of vitamin D, at least 2-3 times per year. She was informed of the risk of over-replacement of vitamin D and agrees to not increase her dose unless he discusses this with us first. Donna [name] agrees to follow up with our clinic in 3 weeks.  Hyperlipidemia Donna [name] was informed of the American Heart Association Guidelines emphasizing intensive lifestyle modifications as the first line treatment for hyperlipidemia. We discussed many lifestyle modifications today in depth, and Donna [name] will continue to work on decreasing saturated fats such as fatty red meat, butter and many fried foods. She will also increase vegetables and lean protein in her diet and continue to work on diet, exercise and weight loss efforts. Donna [name] agrees to follow up with our clinic in 3 weeks.  Obesity Donna [name] is currently in the action stage of change. As such, her goal is to continue with weight loss efforts She has agreed to follow our protein rich vegetarian plan Donna [name] has been instructed to work up to a goal of 150 minutes of combined cardio and strengthening exercise per week for weight loss and overall health benefits. We discussed the following Behavioral Modification Strategies today: increasing lean protein intake and  work on meal planning and easy cooking plans   Donna [name] has agreed to follow up with our clinic in 3 weeks. She was informed of the importance of frequent follow up visits to maximize her success with intensive lifestyle modifications for her multiple health conditions.  I, Burt KnackSharon Martin, am acting as transcriptionist for Illa LevelSahar Osman, PA-C  I have reviewed the above documentation for accuracy and completeness, and I agree with the above. -Illa LevelSahar Osman, PA-C  I have reviewed the above note and agree with the plan. -Quillian Quincearen Beasley, MD     Today's visit was # 9 out of 22.  Starting weight: 167 lbs Starting date: 09/19/16 Today's weight : 163 lbs Today's date: 02/22/2017 Total lbs lost to date: 4 (Patients must lose 7 lbs in the first 6 months to continue with counseling)   ASK: We discussed the diagnosis of obesity with Bennye AlmMelanie J Suber today and Donna [name] agreed to give us permission to discuss obesity behavioral modification therapy today.  ASSESS: Donna [name] has the diagnosis of obesity and her BMI today is 4427 Nohelani is in the action stage of change   ADVISE: Donna [name] was educated on the multiple health risks of obesity as well as the benefit of weight loss to improve her health. She was advised of the need for long term treatment and the importance of lifestyle modifications.  AGREE: Multiple dietary modification options and treatment options were discussed and  Zohar agreed to follow our protein rich vegetarian plan We discussed the following Behavioral Modification Strategies today: increasing lean protein intake and work on meal planning and easy cooking plans

## 2017-02-27 DIAGNOSIS — M62830 Muscle spasm of back: Secondary | ICD-10-CM | POA: Diagnosis not present

## 2017-02-27 DIAGNOSIS — M9903 Segmental and somatic dysfunction of lumbar region: Secondary | ICD-10-CM | POA: Diagnosis not present

## 2017-02-27 DIAGNOSIS — S335XXA Sprain of ligaments of lumbar spine, initial encounter: Secondary | ICD-10-CM | POA: Diagnosis not present

## 2017-02-27 DIAGNOSIS — M9905 Segmental and somatic dysfunction of pelvic region: Secondary | ICD-10-CM | POA: Diagnosis not present

## 2017-03-01 DIAGNOSIS — M9903 Segmental and somatic dysfunction of lumbar region: Secondary | ICD-10-CM | POA: Diagnosis not present

## 2017-03-01 DIAGNOSIS — M9905 Segmental and somatic dysfunction of pelvic region: Secondary | ICD-10-CM | POA: Diagnosis not present

## 2017-03-01 DIAGNOSIS — S335XXA Sprain of ligaments of lumbar spine, initial encounter: Secondary | ICD-10-CM | POA: Diagnosis not present

## 2017-03-01 DIAGNOSIS — M62830 Muscle spasm of back: Secondary | ICD-10-CM | POA: Diagnosis not present

## 2017-03-06 DIAGNOSIS — M62830 Muscle spasm of back: Secondary | ICD-10-CM | POA: Diagnosis not present

## 2017-03-06 DIAGNOSIS — M9905 Segmental and somatic dysfunction of pelvic region: Secondary | ICD-10-CM | POA: Diagnosis not present

## 2017-03-06 DIAGNOSIS — M9903 Segmental and somatic dysfunction of lumbar region: Secondary | ICD-10-CM | POA: Diagnosis not present

## 2017-03-06 DIAGNOSIS — S335XXA Sprain of ligaments of lumbar spine, initial encounter: Secondary | ICD-10-CM | POA: Diagnosis not present

## 2017-03-07 ENCOUNTER — Ambulatory Visit (HOSPITAL_BASED_OUTPATIENT_CLINIC_OR_DEPARTMENT_OTHER)
Admission: RE | Admit: 2017-03-07 | Discharge: 2017-03-07 | Disposition: A | Discharge status: Home / Self Care | Payer: BLUE CROSS/BLUE SHIELD | Source: Ambulatory Visit | Attending: Chiropractor | Admitting: Chiropractor

## 2017-03-07 ENCOUNTER — Other Ambulatory Visit (HOSPITAL_BASED_OUTPATIENT_CLINIC_OR_DEPARTMENT_OTHER): Payer: Self-pay | Admitting: Chiropractor

## 2017-03-07 DIAGNOSIS — M47812 Spondylosis without myelopathy or radiculopathy, cervical region: Secondary | ICD-10-CM | POA: Insufficient documentation

## 2017-03-07 DIAGNOSIS — M419 Scoliosis, unspecified: Secondary | ICD-10-CM | POA: Insufficient documentation

## 2017-03-07 DIAGNOSIS — W19XXXA Unspecified fall, initial encounter: Secondary | ICD-10-CM | POA: Diagnosis not present

## 2017-03-07 DIAGNOSIS — S3992XA Unspecified injury of lower back, initial encounter: Secondary | ICD-10-CM | POA: Diagnosis not present

## 2017-03-07 DIAGNOSIS — M545 Low back pain: Secondary | ICD-10-CM | POA: Diagnosis not present

## 2017-03-07 DIAGNOSIS — M542 Cervicalgia: Secondary | ICD-10-CM | POA: Diagnosis not present

## 2017-03-07 DIAGNOSIS — Z975 Presence of (intrauterine) contraceptive device: Secondary | ICD-10-CM | POA: Insufficient documentation

## 2017-03-07 DIAGNOSIS — R52 Pain, unspecified: Secondary | ICD-10-CM | POA: Insufficient documentation

## 2017-03-07 DIAGNOSIS — S199XXA Unspecified injury of neck, initial encounter: Secondary | ICD-10-CM | POA: Diagnosis not present

## 2017-03-08 DIAGNOSIS — M9905 Segmental and somatic dysfunction of pelvic region: Secondary | ICD-10-CM | POA: Diagnosis not present

## 2017-03-08 DIAGNOSIS — M9903 Segmental and somatic dysfunction of lumbar region: Secondary | ICD-10-CM | POA: Diagnosis not present

## 2017-03-08 DIAGNOSIS — S335XXA Sprain of ligaments of lumbar spine, initial encounter: Secondary | ICD-10-CM | POA: Diagnosis not present

## 2017-03-08 DIAGNOSIS — M62830 Muscle spasm of back: Secondary | ICD-10-CM | POA: Diagnosis not present

## 2017-03-13 DIAGNOSIS — S335XXA Sprain of ligaments of lumbar spine, initial encounter: Secondary | ICD-10-CM | POA: Diagnosis not present

## 2017-03-13 DIAGNOSIS — M9905 Segmental and somatic dysfunction of pelvic region: Secondary | ICD-10-CM | POA: Diagnosis not present

## 2017-03-13 DIAGNOSIS — M9903 Segmental and somatic dysfunction of lumbar region: Secondary | ICD-10-CM | POA: Diagnosis not present

## 2017-03-13 DIAGNOSIS — M62830 Muscle spasm of back: Secondary | ICD-10-CM | POA: Diagnosis not present

## 2017-03-14 ENCOUNTER — Ambulatory Visit (INDEPENDENT_AMBULATORY_CARE_PROVIDER_SITE_OTHER): Payer: BLUE CROSS/BLUE SHIELD | Admitting: Physician Assistant

## 2017-03-14 VITALS — BP 113/78 | HR 73 | Temp 98.4°F | Ht 64.0 in | Wt 163.0 lb

## 2017-03-14 DIAGNOSIS — E669 Obesity, unspecified: Secondary | ICD-10-CM

## 2017-03-14 DIAGNOSIS — Z6830 Body mass index (BMI) 30.0-30.9, adult: Secondary | ICD-10-CM | POA: Diagnosis not present

## 2017-03-14 DIAGNOSIS — E559 Vitamin D deficiency, unspecified: Secondary | ICD-10-CM

## 2017-03-14 NOTE — Progress Notes (Signed)
Office: [phone]  /  Fax: [phone]   HPI:   Chief Complaint: OBESITY Donna [name] is here to discuss her progress with her obesity treatment plan. She is on the protein rich vegetarian plan and is following her eating plan approximately 50 % of the time. She states she is exercising 0 minutes 0 times per week. Donna [name] maintained her weight due to her busy work schedule. She continues to struggle to follow the plan. She makes smarter food choices and controls her portions. Donna [name] will be traveling to [place] and wants travel eating strategies. Her weight is 163 lb (73.9 kg) today and has maintained weight over a period of 3 weeks since her last visit. She has lost 4 lbs since starting treatment with us.  Vitamin D deficiency Donna [name] has a diagnosis of vitamin D deficiency. She is currently taking vit D and denies nausea, vomiting or muscle weakness.   ALLERGIES: Allergies  Allergen Reactions  . Epinephrine Other (See Comments)    Shakes like shes having a seizure  . Augmentin [Amoxicillin-Pot Clavulanate]     States it burns her inside   . Codeine Hives  . Cortisone Swelling  . Levaquin [Levofloxacin In D5w] Other (See Comments)    Blisters on hands  . Omnipred [Prednisolone Acetate] Other (See Comments)    Migraine, emesis  . Sulfa Antibiotics Nausea And Vomiting  . Tequin [Gatifloxacin] Other (See Comments)    Blisters on hands    MEDICATIONS: Current Outpatient Medications on File Prior to Visit  Medication Sig Dispense Refill  . levonorgestrel (MIRENA) 20 MCG/24HR IUD 1 each by Intrauterine route once.    Marland Kitchen. omeprazole (PRILOSEC) 10 MG capsule Take 10 mg by mouth daily.    Marland Kitchen. OVER THE COUNTER MEDICATION OTC Fiber well taking    . Vitamin D, Ergocalciferol, (DRISDOL) 50000 units CAPS capsule Take 1 capsule (50,000 Units total) by mouth every 7 (seven) days. (Patient taking differently: Take 50,000 Units every 14 (fourteen) days by mouth. ) 2 capsule 0   No current  facility-administered medications on file prior to visit.     PAST MEDICAL HISTORY: Past Medical History:  Diagnosis Date  . Allergy   . Anxiety   . Constipation   . Depression   . Diverticulitis   . Elevated BP without diagnosis of hypertension   . GERD (gastroesophageal reflux disease)   . HTN (hypertension)   . IBS (irritable bowel syndrome)   . Night muscle spasms    back  . Osteoarthritis (arthritis due to wear and tear of joints)    knees  . Ovarian cyst   . Pain in the abdomen    lower left quarter  . Palpitations   . SOB (shortness of breath) on exertion   . Swelling    feet and legs    PAST SURGICAL HISTORY: Past Surgical History:  Procedure Laterality Date  . ABDOMINAL ADHESION SURGERY    . acl replacement  2000  . ovarian cysts      SOCIAL HISTORY: Social History   Tobacco Use  . Smoking status: Never Smoker  . Smokeless tobacco: Never Used  Substance Use Topics  . Alcohol use: Yes    Comment: occassionally  . Drug use: No    FAMILY HISTORY: Family History  Problem Relation Age of Onset  . Heart disease Father   . Hypertension Father   . Stroke Father   . Diabetes Father   . Hyperlipidemia Father   . Kidney disease Father   .  Sleep apnea Father   . Obesity Father   . Breast cancer Maternal Aunt   . Breast cancer Maternal Grandmother   . Anxiety disorder Mother     ROS: Review of Systems  Constitutional: Negative for weight loss.  Gastrointestinal: Negative for nausea and vomiting.  Musculoskeletal:       Negative muscle weakness    PHYSICAL EXAM: Blood pressure 113/78, pulse 73, temperature 98.4 F (36.9 C), temperature source Oral, height 5\' 4"  (1.626 m), weight 163 lb (73.9 kg), SpO2 98 %. Body mass index is 27.98 kg/m. Physical Exam  Constitutional: She is oriented to person, place, and time. She appears well-developed and well-nourished.  Cardiovascular: Normal rate.  Pulmonary/Chest: Effort normal.  Musculoskeletal:  Normal range of motion.  Neurological: She is oriented to person, place, and time.  Skin: Skin is warm and dry.  Psychiatric: She has a normal mood and affect. Her behavior is normal.  Vitals reviewed.   RECENT LABS AND TESTS: BMET    Component Value Date/Time   NA 139 02/01/2017 0843   K 4.4 02/01/2017 0843   CL 102 02/01/2017 0843   CO2 24 02/01/2017 0843   GLUCOSE 96 02/01/2017 0843   GLUCOSE 106 (H) 12/11/2015 0945   BUN 17 02/01/2017 0843   CREATININE 0.92 02/01/2017 0843   CALCIUM 9.4 02/01/2017 0843   GFRNONAA 73 02/01/2017 0843   GFRAA 84 02/01/2017 0843   Lab Results  Component Value Date   HGBA1C 5.5 02/01/2017   HGBA1C 5.6 09/19/2016   Lab Results  Component Value Date   INSULIN 10.8 02/01/2017   INSULIN 8.8 09/19/2016   CBC    Component Value Date/Time   WBC 7.1 02/01/2017 0843   WBC 6.6 12/12/2016 0931   WBC 12.4 (H) 12/11/2015 0945   RBC 4.66 02/01/2017 0843   RBC 4.65 12/12/2016 0931   RBC 4.57 12/11/2015 0945   HGB 14.6 02/01/2017 0843   HCT 41.1 02/01/2017 0843   PLT 340 06/13/2016 1153   MCV 88 02/01/2017 0843   MCH 31.3 02/01/2017 0843   MCH 30.9 12/12/2016 0931   MCH 30.9 12/11/2015 0945   MCHC 35.5 02/01/2017 0843   MCHC 35.1 12/12/2016 0931   MCHC 34.0 12/11/2015 0945   RDW 13.3 02/01/2017 0843   LYMPHSABS 2.0 02/01/2017 0843   EOSABS 0.1 02/01/2017 0843   BASOSABS 0.0 02/01/2017 0843   Iron/TIBC/Ferritin/ %Sat No results found for: IRON, TIBC, FERRITIN, IRONPCTSAT Lipid Panel     Component Value Date/Time   CHOL 187 02/01/2017 0843   TRIG 93 02/01/2017 0843   HDL 42 02/01/2017 0843   CHOLHDL 4.6 (H) 06/13/2016 1153   LDLCALC 126 (H) 02/01/2017 0843   Hepatic Function Panel     Component Value Date/Time   PROT 7.2 02/01/2017 0843   ALBUMIN 4.7 02/01/2017 0843   AST 15 02/01/2017 0843   ALT 14 02/01/2017 0843   ALKPHOS 59 02/01/2017 0843   BILITOT 0.3 02/01/2017 0843      Component Value Date/Time   TSH 1.720  02/01/2017 0843   TSH 1.120 09/19/2016 1056   TSH 1.170 06/13/2016 1153    ASSESSMENT AND PLAN: Vitamin D deficiency  Class 1 obesity with serious comorbidity and body mass index (BMI) of 30.0 to 30.9 in adult, unspecified obesity type - Starting BMI greater then 30  PLAN:  Vitamin D Deficiency Donna [name] was informed that low vitamin D levels contributes to fatigue and are associated with obesity, breast, and colon cancer. She agrees  to continue to take prescription Vit D @50 ,000 IU every week and will follow up for routine testing of vitamin D, at least 2-3 times per year. She was informed of the risk of over-replacement of vitamin D and agrees to not increase her dose unless he discusses this with us first.  We spent > than 50% of the 15 minute visit on the counseling as documented in the note.  Obesity Donna [name] is currently in the action stage of change. As such, her goal is to continue with weight loss efforts She has agreed to keep a food journal with 500 calories and 40 grams of protein at supper daily and follow the Category 2 plan Donna [name] has been instructed to work up to a goal of 150 minutes of combined cardio and strengthening exercise per week for weight loss and overall health benefits. We discussed the following Behavioral Modification Strategies today: increasing lean protein intake and travel eating strategies  Donna [name] has agreed to follow up with our clinic in 9 weeks. She was informed of the importance of frequent follow up visits to maximize her success with intensive lifestyle modifications for her multiple health conditions.  I, [place] CraneJoanne Murray, am acting as transcriptionist for Illa LevelSahar Osman, PA-C  I have reviewed the above documentation for accuracy and completeness, and I agree with the above. -Illa LevelSahar Osman, PA-C  I have reviewed the above note and agree with the plan. -Quillian Quincearen Beasley, MD   OBESITY BEHAVIORAL INTERVENTION VISIT  Today's visit was # 10 out of  22.  Starting weight: 167 lbs Starting date: 09/19/16 Today's weight : 163 lbs  Today's date: 03/14/2017 Total lbs lost to date: 4 (Patients must lose 7 lbs in the first 6 months to continue with counseling)   ASK: We discussed the diagnosis of obesity with Donna [name] today and Donna [name] agreed to give us permission to discuss obesity behavioral modification therapy today.  ASSESS: Donna [name] has the diagnosis of obesity and her BMI today is 27.97 Donna [name] is in the action stage of change   ADVISE: Donna [name] was educated on the multiple health risks of obesity as well as the benefit of weight loss to improve her health. She was advised of the need for long term treatment and the importance of lifestyle modifications.  AGREE: Multiple dietary modification options and treatment options were discussed and  Sneha agreed to keep a food journal with 500 calories and 40 grams of protein at supper daily and follow the Category 2 plan We discussed the following Behavioral Modification Strategies today: increasing lean protein intake and travel eating strategies

## 2017-03-20 DIAGNOSIS — M62830 Muscle spasm of back: Secondary | ICD-10-CM | POA: Diagnosis not present

## 2017-03-20 DIAGNOSIS — M9903 Segmental and somatic dysfunction of lumbar region: Secondary | ICD-10-CM | POA: Diagnosis not present

## 2017-03-20 DIAGNOSIS — S335XXA Sprain of ligaments of lumbar spine, initial encounter: Secondary | ICD-10-CM | POA: Diagnosis not present

## 2017-03-20 DIAGNOSIS — M9905 Segmental and somatic dysfunction of pelvic region: Secondary | ICD-10-CM | POA: Diagnosis not present

## 2017-04-04 ENCOUNTER — Other Ambulatory Visit (INDEPENDENT_AMBULATORY_CARE_PROVIDER_SITE_OTHER): Payer: Self-pay | Admitting: Family Medicine

## 2017-04-04 DIAGNOSIS — E559 Vitamin D deficiency, unspecified: Secondary | ICD-10-CM

## 2017-05-14 ENCOUNTER — Encounter (INDEPENDENT_AMBULATORY_CARE_PROVIDER_SITE_OTHER): Payer: Self-pay | Admitting: Physician Assistant

## 2017-05-15 ENCOUNTER — Ambulatory Visit (INDEPENDENT_AMBULATORY_CARE_PROVIDER_SITE_OTHER): Payer: BLUE CROSS/BLUE SHIELD | Admitting: Physician Assistant

## 2017-05-15 ENCOUNTER — Encounter (INDEPENDENT_AMBULATORY_CARE_PROVIDER_SITE_OTHER): Payer: Self-pay

## 2017-11-01 ENCOUNTER — Ambulatory Visit: Payer: BLUE CROSS/BLUE SHIELD | Admitting: Physician Assistant

## 2017-11-01 DIAGNOSIS — J029 Acute pharyngitis, unspecified: Secondary | ICD-10-CM | POA: Diagnosis not present

## 2017-11-01 DIAGNOSIS — J302 Other seasonal allergic rhinitis: Secondary | ICD-10-CM | POA: Diagnosis not present

## 2017-12-11 ENCOUNTER — Encounter: Payer: Self-pay | Admitting: Family Medicine

## 2017-12-11 ENCOUNTER — Other Ambulatory Visit: Payer: Self-pay

## 2017-12-11 ENCOUNTER — Ambulatory Visit (INDEPENDENT_AMBULATORY_CARE_PROVIDER_SITE_OTHER): Payer: BLUE CROSS/BLUE SHIELD | Admitting: Family Medicine

## 2017-12-11 VITALS — BP 126/76 | HR 70 | Temp 98.9°F | Resp 17 | Ht 64.0 in | Wt 172.4 lb

## 2017-12-11 DIAGNOSIS — Z1231 Encounter for screening mammogram for malignant neoplasm of breast: Secondary | ICD-10-CM

## 2017-12-11 DIAGNOSIS — H60392 Other infective otitis externa, left ear: Secondary | ICD-10-CM

## 2017-12-11 DIAGNOSIS — J0130 Acute sphenoidal sinusitis, unspecified: Secondary | ICD-10-CM | POA: Diagnosis not present

## 2017-12-11 MED ORDER — CIPROFLOXACIN-DEXAMETHASONE 0.3-0.1 % OT SUSP: 4.0000 [drp] | Freq: Two times a day (BID) | OTIC | 0 refills | Status: DC

## 2017-12-11 MED ORDER — AZITHROMYCIN 250 MG PO TABS: ORAL_TABLET | ORAL | 0 refills | Status: DC

## 2017-12-11 NOTE — Patient Instructions (Addendum)
   IF you received an x-ray today, you will receive an invoice from [facility]. Please contact [facility] at [phone] with questions or concerns regarding your invoice.   IF you received labwork today, you will receive an invoice from [facility]. Please contact [facility] at [phone] with questions or concerns regarding your invoice.   Our billing staff will not be able to assist you with questions regarding bills from these companies.  You will be contacted with the lab results as soon as they are available. The fastest way to get your results is to activate your My Chart account. Instructions are located on the last page of this paperwork. If you have not heard from us regarding the results in 2 weeks, please contact this office.     Otitis Externa Otitis externa is an infection of the outer ear canal. The outer ear canal is the area between the outside of the ear and the eardrum. Otitis externa is sometimes called "swimmer's ear." What are the causes? This condition may be caused by:  Swimming in dirty water.  Moisture in the ear.  An injury to the inside of the ear.  An object stuck in the ear.  A cut or scrape on the outside of the ear.  What increases the risk? This condition is more likely to develop in swimmers. What are the signs or symptoms? The first symptom of this condition is often itching in the ear. Later signs and symptoms include:  Swelling of the ear.  Redness in the ear.  Ear pain. The pain may get worse when you pull on your ear.  Pus coming from the ear.  How is this diagnosed? This condition may be diagnosed by examining the ear and testing fluid from the ear for bacteria and funguses. How is this treated? This condition may be treated with:  Antibiotic ear drops. These are often given for 10-14 days.  Medicine to reduce itching and swelling.  Follow these instructions at home:  If you were prescribed antibiotic ear  drops, apply them as told by your health care provider. Do not stop using the antibiotic even if your condition improves.  Take over-the-counter and prescription medicines only as told by your health care provider.  Keep all follow-up visits as told by your health care provider. This is important. How is this prevented?  Keep your ear dry. Use the corner of a towel to dry your ear after you swim or bathe.  Avoid scratching or putting things in your ear. Doing these things can damage the ear canal or remove the protective wax that lines it, which makes it easier for bacteria and funguses to grow.  Avoid swimming in lakes, polluted water, or pools that may not have the right amount of chlorine.  Consider making ear drops and putting 3 or 4 drops in each ear after you swim. Ask your health care provider about how you can make ear drops. Contact a health care provider if:  You have a fever.  After 3 days your ear is still red, swollen, painful, or draining pus.  Your redness, swelling, or pain gets worse.  You have a severe headache.  You have redness, swelling, pain, or tenderness in the area behind your ear. This information is not intended to replace advice given to you by your health care provider. Make sure you discuss any questions you have with your health care provider. Document Released: 04/25/2005 Document Revised: 06/02/2015 Document Reviewed: 02/02/2015 Elsevier Interactive Patient   Education  2018 [employer]  Sinusitis, Adult Sinusitis is soreness and inflammation of your sinuses. Sinuses are hollow spaces in the bones around your face. Your sinuses are located:  Around your eyes.  In the middle of your forehead.  Behind your nose.  In your cheekbones.  Your sinuses and nasal passages are lined with a stringy fluid (mucus). Mucus normally drains out of your sinuses. When your nasal tissues become inflamed or swollen, the mucus can become trapped or blocked so air  cannot flow through your sinuses. This allows bacteria, viruses, and funguses to grow, which leads to infection. Sinusitis can develop quickly and last for 7?10 days (acute) or for more than 12 weeks (chronic). Sinusitis often develops after a cold. What are the causes? This condition is caused by anything that creates swelling in the sinuses or stops mucus from draining, including:  Allergies.  Asthma.  Bacterial or viral infection.  Abnormally shaped bones between the nasal passages.  Nasal growths that contain mucus (nasal polyps).  Narrow sinus openings.  Pollutants, such as chemicals or irritants in the air.  A foreign object stuck in the nose.  A fungal infection. This is rare.  What increases the risk? The following factors may make you more likely to develop this condition:  Having allergies or asthma.  Having had a recent cold or respiratory tract infection.  Having structural deformities or blockages in your nose or sinuses.  Having a weak immune system.  Doing a lot of swimming or diving.  Overusing nasal sprays.  Smoking.  What are the signs or symptoms? The main symptoms of this condition are pain and a feeling of pressure around the affected sinuses. Other symptoms include:  Upper toothache.  Earache.  Headache.  Bad breath.  Decreased sense of smell and taste.  A cough that may get worse at night.  Fatigue.  Fever.  Thick drainage from your nose. The drainage is often green and it may contain pus (purulent).  Stuffy nose or congestion.  Postnasal drip. This is when extra mucus collects in the throat or back of the nose.  Swelling and warmth over the affected sinuses.  Sore throat.  Sensitivity to light.  How is this diagnosed? This condition is diagnosed based on symptoms, a medical history, and a physical exam. To find out if your condition is acute or chronic, your health care provider may:  Look in your nose for signs of  nasal polyps.  Tap over the affected sinus to check for signs of infection.  View the inside of your sinuses using an imaging device that has a light attached (endoscope).  If your health care provider suspects that you have chronic sinusitis, you may also:  Be tested for allergies.  Have a sample of mucus taken from your nose (nasal culture) and checked for bacteria.  Have a mucus sample examined to see if your sinusitis is related to an allergy.  If your sinusitis does not respond to treatment and it lasts longer than 8 weeks, you may have an MRI or CT scan to check your sinuses. These scans also help to determine how severe your infection is. In rare cases, a bone biopsy may be done to rule out more serious types of fungal sinus disease. How is this treated? Treatment for sinusitis depends on the cause and whether your condition is chronic or acute. If a virus is causing your sinusitis, your symptoms will go away on their own within 10 days. You may  be given medicines to relieve your symptoms, including:  Topical nasal decongestants. They shrink swollen nasal passages and let mucus drain from your sinuses.  Antihistamines. These drugs block inflammation that is triggered by allergies. This can help to ease swelling in your nose and sinuses.  Topical nasal corticosteroids. These are nasal sprays that ease inflammation and swelling in your nose and sinuses.  Nasal saline washes. These rinses can help to get rid of thick mucus in your nose.  If your condition is caused by bacteria, you will be given an antibiotic medicine. If your condition is caused by a fungus, you will be given an antifungal medicine. Surgery may be needed to correct underlying conditions, such as narrow nasal passages. Surgery may also be needed to remove polyps. Follow these instructions at home: Medicines  Take, use, or apply over-the-counter and prescription medicines only as told by your health care provider.  These may include nasal sprays.  If you were prescribed an antibiotic medicine, take it as told by your health care provider. Do not stop taking the antibiotic even if you start to feel better. Hydrate and Humidify  Drink enough water to keep your urine clear or pale yellow. Staying hydrated will help to thin your mucus.  Use a cool mist humidifier to keep the humidity level in your home above 50%.  Inhale steam for 10-15 minutes, 3-4 times a day or as told by your health care provider. You can do this in the bathroom while a hot shower is running.  Limit your exposure to cool or dry air. Rest  Rest as much as possible.  Sleep with your head raised (elevated).  Make sure to get enough sleep each night. General instructions  Apply a warm, moist washcloth to your face 3-4 times a day or as told by your health care provider. This will help with discomfort.  Wash your hands often with soap and water to reduce your exposure to viruses and other germs. If soap and water are not available, use hand sanitizer.  Do not smoke. Avoid being around people who are smoking (secondhand smoke).  Keep all follow-up visits as told by your health care provider. This is important. Contact a health care provider if:  You have a fever.  Your symptoms get worse.  Your symptoms do not improve within 10 days. Get help right away if:  You have a severe headache.  You have persistent vomiting.  You have pain or swelling around your face or eyes.  You have vision problems.  You develop confusion.  Your neck is stiff.  You have trouble breathing. This information is not intended to replace advice given to you by your health care provider. Make sure you discuss any questions you have with your health care provider. Document Released: 04/25/2005 Document Revised: 12/20/2015 Document Reviewed: 02/18/2015 Elsevier Interactive Patient Education  [employer].

## 2017-12-12 ENCOUNTER — Encounter: Payer: Self-pay | Admitting: Family Medicine

## 2017-12-12 ENCOUNTER — Telehealth: Payer: Self-pay | Admitting: Family Medicine

## 2017-12-12 NOTE — Telephone Encounter (Signed)
Please advise 

## 2017-12-12 NOTE — Telephone Encounter (Signed)
Copied from CRM [phone]. Topic: Quick Communication - See Telephone Encounter >> Dec 12, 2017 12:57 PM Waymon AmatoBurton, Donna F wrote: Pt states she saw Dr. Clelia CroftShaw yesterday and she gave her some ear drops for pain and [facility] [address] will not give them to patient because  I believe that the drops need prior aprroval   Best number is [phone]

## 2017-12-13 ENCOUNTER — Encounter: Payer: Self-pay | Admitting: Family Medicine

## 2017-12-15 ENCOUNTER — Telehealth: Payer: Self-pay | Admitting: *Deleted

## 2017-12-15 MED ORDER — NEOMYCIN-POLYMYXIN-HC 3.5-10000-1 OT SOLN: 4.0000 [drp] | Freq: Four times a day (QID) | OTIC | 0 refills | Status: AC

## 2017-12-15 NOTE — Addendum Note (Signed)
Addended by: Sherren MochaSHAW, EVA N on: 12/15/2017 04:26 PM   Modules accepted: Orders

## 2017-12-15 NOTE — Telephone Encounter (Signed)
Pt  called the office and stated that Cipro is $300.00 and insurance will not cover. I spoke with the patient to advise [facility] that [facility] names of medication that Dr. Clelia CroftShaw can use for ear drops to replace the Cipro. Pt advised that per Dr. Clelia CroftShaw will review them after her clinic and will make a decision on medication.

## 2017-12-15 NOTE — Telephone Encounter (Addendum)
Burna MortimerWanda talked to pharmacy who advised that either ofloxacin abx ear gtts or cortisporin otic will be $50 for her.  Sent in the cortisporin (despite allergy to "cortisone" with swelling) as flouroquinolones levaquin and gatifloxacin are both on allergy list as having prev caused blisters on her hands - seems best to avoid the possibility of a blistering rx in ear canal esp as she is leaving town today.  Meds ordered this encounter  Medications  . neomycin-polymyxin-hydrocortisone (CORTISPORIN) OTIC solution    Sig: Place 4 drops into the left ear 4 (four) times daily for 10 days. Lie with left ear upward x 5 min after application.    Dispense:  10 mL    Refill:  0

## 2017-12-15 NOTE — Telephone Encounter (Signed)
Pt called advised that Rx for ear drop has been sent to the pharmacy, per Dr. Clelia CroftShaw.

## 2017-12-15 NOTE — Telephone Encounter (Signed)
Duplicate phone note - sent in cortisporin

## 2017-12-15 NOTE — Progress Notes (Addendum)
Subjective:    Patient ID: Donna [name], female    DOB: 05-16-1965, 52 y.o.   MRN: [id number] Chief Complaint  Patient presents with  . left ear pain    since June, thought ? swimmer's ear, rinsed ears no debre, tried peroxide uanbel to hear out of ear.  Tried flonase no relief, ibuprofen /swimmer'sear gtts with no relief.  Pain level 8/10, stabbing, intermittent , dizziness and  unbalanced.    HPI see above  Past Medical History:  Diagnosis Date  . Allergy   . Anxiety   . Constipation   . Depression   . Diverticulitis   . Elevated BP without diagnosis of hypertension   . GERD (gastroesophageal reflux disease)   . HTN (hypertension)   . IBS (irritable bowel syndrome)   . Night muscle spasms    back  . Osteoarthritis (arthritis due to wear and tear of joints)    knees  . Ovarian cyst   . Pain in the abdomen    lower left quarter  . Palpitations   . SOB (shortness of breath) on exertion   . Swelling    feet and legs   Past Surgical History:  Procedure Laterality Date  . ABDOMINAL ADHESION SURGERY    . acl replacement  2000  . ovarian cysts     Current Outpatient Medications on File Prior to Visit  Medication Sig Dispense Refill  . levonorgestrel (MIRENA) 20 MCG/24HR IUD 1 each by Intrauterine route once.    Marland Kitchen omeprazole (PRILOSEC) 10 MG capsule Take 10 mg by mouth daily.    Marland Kitchen OVER THE COUNTER MEDICATION OTC Fiber well taking    . Vitamin D, Ergocalciferol, (DRISDOL) 50000 units CAPS capsule Take 1 capsule (50,000 Units total) by mouth every 7 (seven) days. (Patient not taking: Reported on 12/11/2017) 2 capsule 0  . Vitamin D, Ergocalciferol, (DRISDOL) 50000 units CAPS capsule TAKE 1 CAPSULE (50,000 UNITS TOTAL) BY MOUTH EVERY 7 (SEVEN) DAYS. (Patient not taking: Reported on 12/11/2017) 4 capsule 0   No current facility-administered medications on file prior to visit.    Allergies  Allergen Reactions  . Epinephrine Other (See Comments)    Shakes like shes having a  seizure  . Augmentin [Amoxicillin-Pot Clavulanate]     States it burns her inside   . Codeine Hives  . Cortisone Swelling  . Levaquin [Levofloxacin In D5w] Other (See Comments)    Blisters on hands  . Omnipred [Prednisolone Acetate] Other (See Comments)    Migraine, emesis  . Sulfa Antibiotics Nausea And Vomiting  . Sulfasalazine Nausea And Vomiting  . Tequin [Gatifloxacin] Other (See Comments)    Blisters on hands  . Cefdinir Nausea And Vomiting    Reports blisters on hands and feet and migraines.   Family History  Problem Relation Age of Onset  . Heart disease Father   . Hypertension Father   . Stroke Father   . Diabetes Father   . Hyperlipidemia Father   . Kidney disease Father   . Sleep apnea Father   . Obesity Father   . Breast cancer Maternal Aunt   . Breast cancer Maternal Grandmother   . Anxiety disorder Mother    Social History   Socioeconomic History  . Marital status: Divorced    Spouse name: Not on file  . Number of children: 2  . Years of education: Not on file  . Highest education level: Not on file  Occupational History  . Occupation: Financial risk analyst  Needs  . Financial resource strain: Not on file  . Food insecurity:    Worry: Not on file    Inability: Not on file  . Transportation needs:    Medical: Not on file    Non-medical: Not on file  Tobacco Use  . Smoking status: Never Smoker  . Smokeless tobacco: Never Used  Substance and Sexual Activity  . Alcohol use: Yes    Comment: occassionally  . Drug use: No  . Sexual activity: Yes    Birth control/protection: IUD  Lifestyle  . Physical activity:    Days per week: Not on file    Minutes per session: Not on file  . Stress: Not on file  Relationships  . Social connections:    Talks on phone: Not on file    Gets together: Not on file    Attends religious service: Not on file    Active member of club or organization: Not on file    Attends meetings of clubs or organizations: Not on  file    Relationship status: Not on file  Other Topics Concern  . Not on file  Social History Narrative  . Not on file   [facility] 12/11/2017 12/12/2016 09/19/2016 09/15/2016 06/13/2016  Decreased Interest 0 0 3 0 0  Down, Depressed, Hopeless 0 0 1 0 0  PHQ - 2 Score 0 0 4 0 0  Altered sleeping - - 3 - -  Tired, decreased energy - - 3 - -  Change in appetite - - 1 - -  Feeling bad or failure about yourself  - - 0 - -  Trouble concentrating - - 2 - -  Moving slowly or fidgety/restless - - 1 - -  PHQ-9 Score - - 14 - -      Review of Systems  Constitutional: Positive for fatigue. Negative for activity change, appetite change, chills, diaphoresis and fever.  HENT: Positive for congestion, ear pain, facial swelling, hearing loss, rhinorrhea, sinus pressure and sinus pain. Negative for ear discharge, mouth sores, nosebleeds, postnasal drip, sneezing, sore throat, trouble swallowing and voice change.   Eyes: Negative for discharge and itching.  Respiratory: Negative for cough and shortness of breath.   Cardiovascular: Negative for chest pain.  Gastrointestinal: Negative for abdominal pain, nausea and vomiting.  Musculoskeletal: Negative for neck pain and neck stiffness.  Skin: Negative for rash.  Neurological: Positive for dizziness, light-headedness and headaches. Negative for syncope.  Hematological: Negative for adenopathy.  Psychiatric/Behavioral: Positive for sleep disturbance.   See hpi    Objective:   Physical Exam  Constitutional: She is oriented to person, place, and time. She appears well-developed and well-nourished. She appears lethargic. She appears ill. No distress.  HENT:  Head: Normocephalic and atraumatic.  Right Ear: External ear normal. Tympanic membrane is retracted. A middle ear effusion is present.  Left Ear: External ear normal. There is drainage, swelling and tenderness. Tympanic membrane is retracted. A middle ear effusion is present.  Nose:  Mucosal edema and rhinorrhea present. Right sinus exhibits maxillary sinus tenderness. Left sinus exhibits maxillary sinus tenderness.  Mouth/Throat: Uvula is midline and mucous membranes are normal. Posterior oropharyngeal erythema present. No oropharyngeal exudate, posterior oropharyngeal edema or tonsillar abscesses.  Eyes: Conjunctivae are normal. Right eye exhibits no discharge. Left eye exhibits no discharge. No scleral icterus.  Neck: Normal range of motion. Neck supple.  Cardiovascular: Normal rate, regular rhythm, normal heart sounds and intact distal pulses.  Pulmonary/Chest: Effort normal and  breath sounds normal.  Lymphadenopathy:       Head (right side): Submandibular adenopathy present. No preauricular and no posterior auricular adenopathy present.       Head (left side): Submandibular adenopathy present. No preauricular and no posterior auricular adenopathy present.    She has no cervical adenopathy.       Right: No supraclavicular adenopathy present.       Left: No supraclavicular adenopathy present.  Neurological: She is oriented to person, place, and time. She appears lethargic.  Skin: Skin is warm and dry. She is not diaphoretic. No erythema.  Psychiatric: She has a normal mood and affect. Her behavior is normal.         BP 126/76 (BP Location: Left Arm, Patient Position: Sitting, Cuff Size: Normal)   Pulse 70   Temp 98.9 F (37.2 C) (Oral)   Resp 17   Ht 5\' 4"  (1.626 m)   Wt 172 lb 6.4 oz (78.2 kg)   SpO2 98%   BMI 29.59 kg/m   Assessment & Plan:   1. Infective otitis externa of left ear   2. Acute non-recurrent sphenoidal sinusitis   3. Encounter for screening mammogram for breast cancer     Orders Placed This Encounter  Procedures  . MM Digital Screening    Standing Status:   Future    Standing Expiration Date:   02/11/2019    Order Specific Question:   Reason for Exam (SYMPTOM  OR DIAGNOSIS REQUIRED)    Answer:   screening    Order Specific Question:    Is the patient pregnant?    Answer:   No    Order Specific Question:   Preferred imaging location?    Answer:   [facility]    Meds ordered this encounter  Medications  . azithromycin (ZITHROMAX) 250 MG tablet    Sig: Take 2 tabs PO x 1 dose, then 1 tab PO QD x 4 days    Dispense:  6 tablet    Refill:  0  . Ociprofloxacin-dexamethasone (CIPRODEX) OTIC suspension    Sig: Place 4 drops into both ears 2 (two) times daily.    Dispense:  7.5 mL    Refill:  0    Norberto Sorenson, M.D.  Primary Care at [facility] [address] [place], [place] [address] [phone] phone [phone] fax  12/15/17 4:14 PM

## 2017-12-27 NOTE — Telephone Encounter (Signed)
RX denial : Ciprodex   Please advise. Papers in [employer] box  Tanya 8.21.19

## 2018-01-25 IMAGING — MG DIGITAL SCREENING BILATERAL MAMMOGRAM WITH CAD
5 series · 5 of 5 positions shown · non-contrast
Comparison: Previous exam(s).

CLINICAL DATA: Screening.

EXAM:
DIGITAL SCREENING BILATERAL MAMMOGRAM WITH CAD

[R CC]
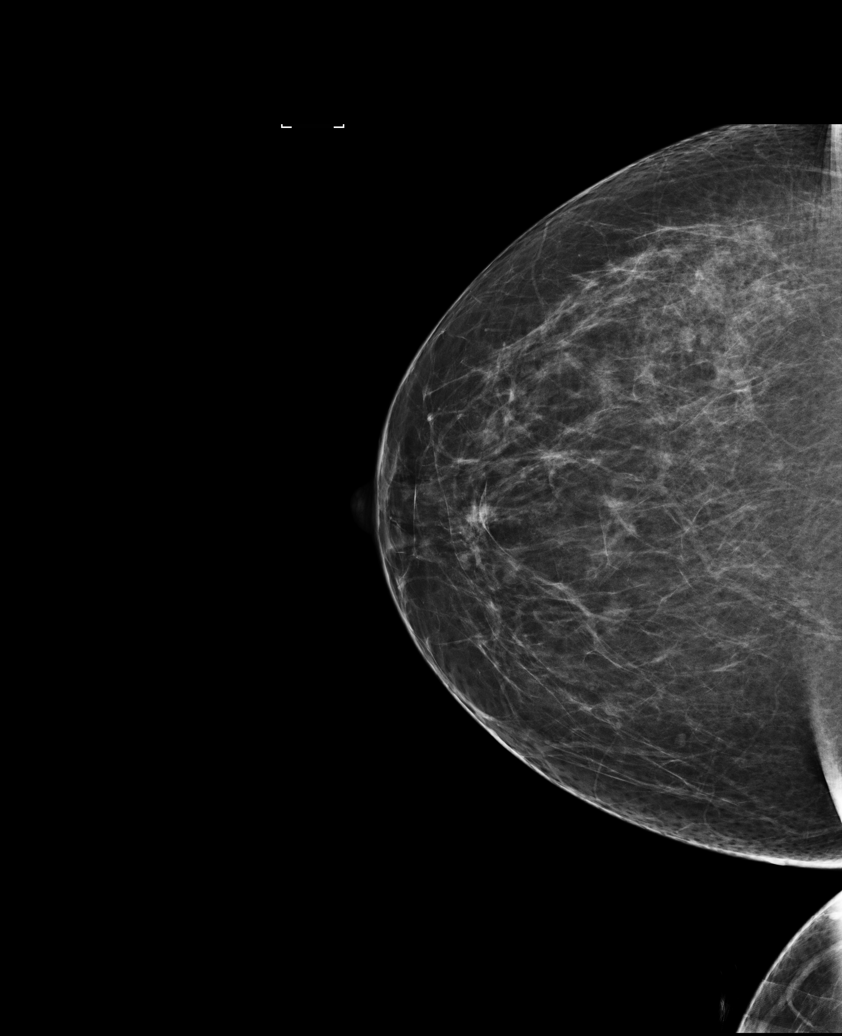

[L MLO (1 of 2)]
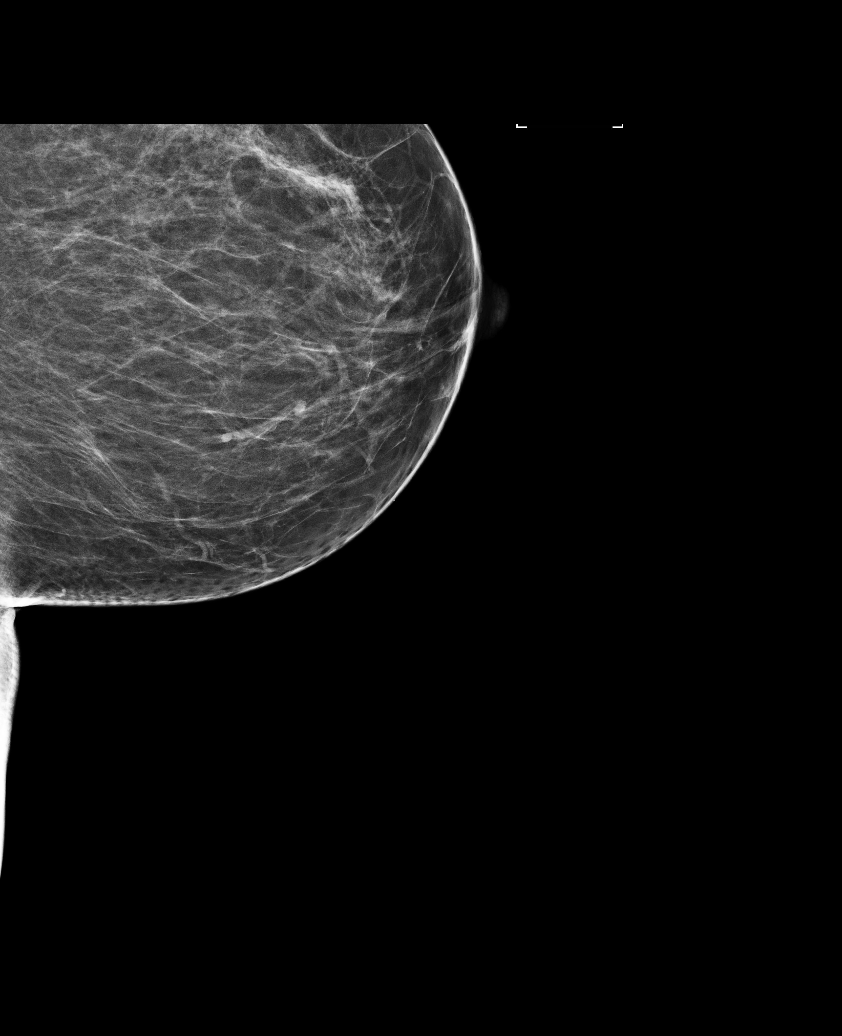

[R MLO]
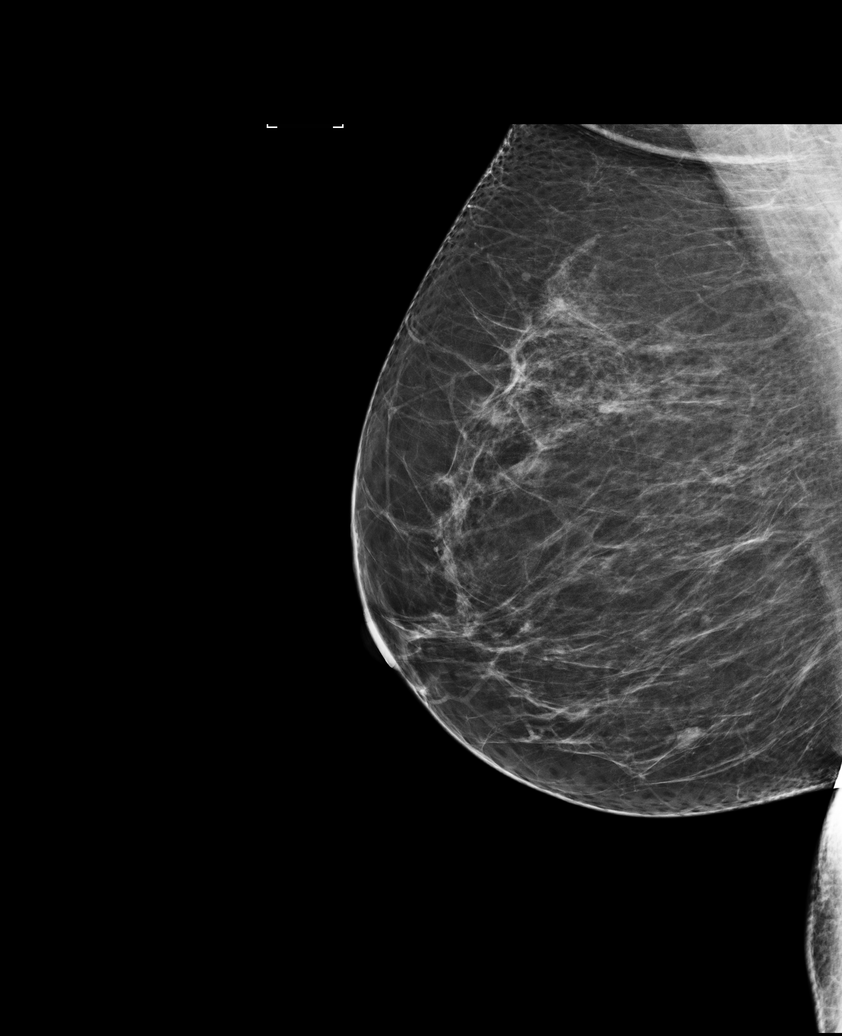

[L CC]
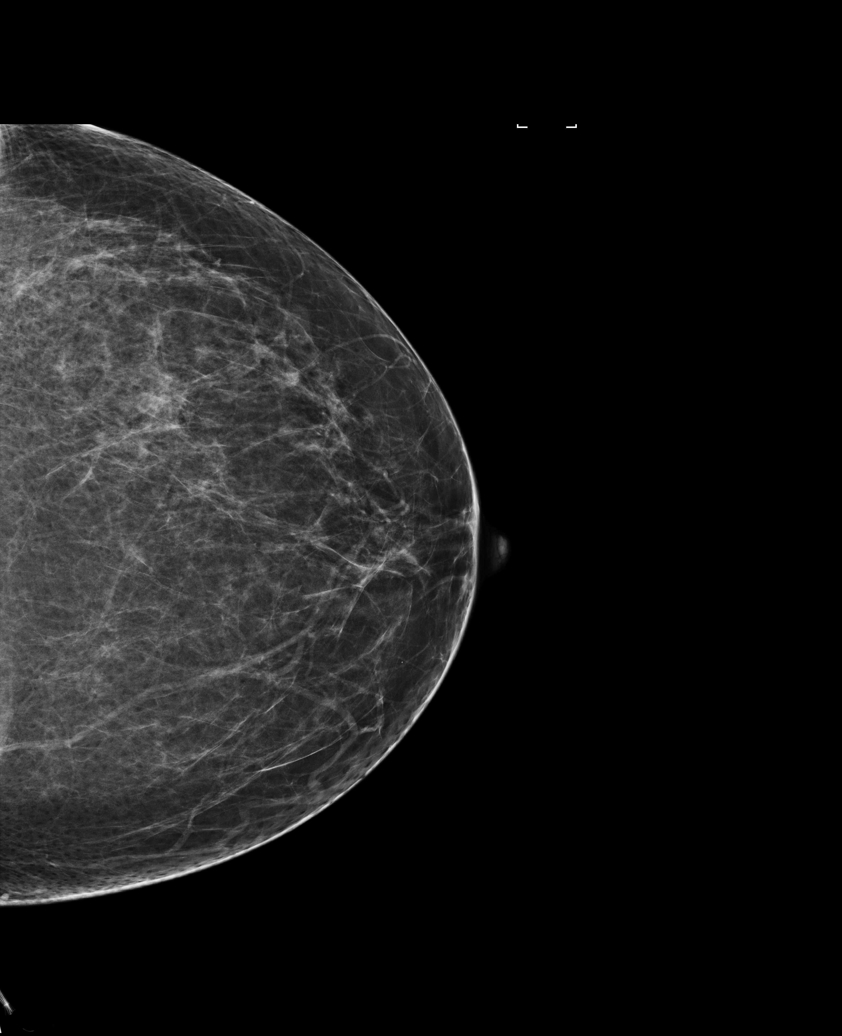

[L MLO (2 of 2)]
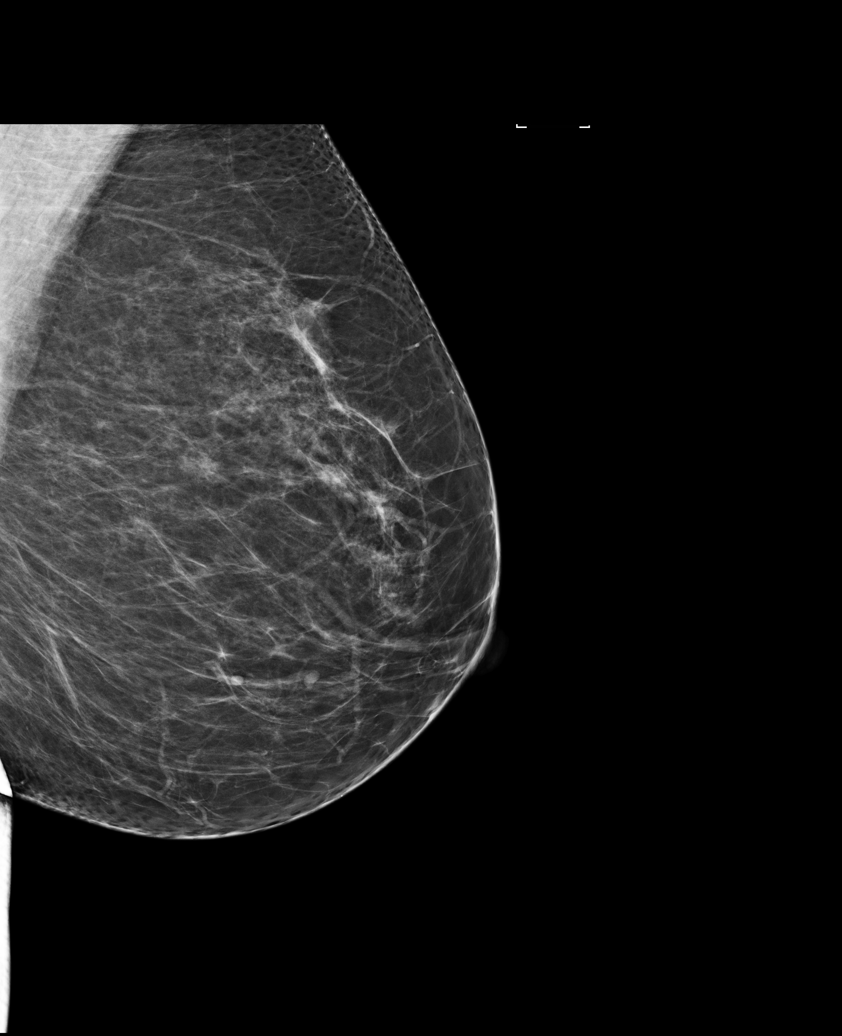

[5 of 5 positions shown; findings below may reference images not displayed]

ACR Breast Density Category b: There are scattered areas of
fibroglandular density.
FINDINGS: There are no findings suspicious for malignancy. Images were
processed with CAD.
IMPRESSION: No mammographic evidence of malignancy. A result letter of this
screening mammogram will be mailed directly to the patient.

RECOMMENDATION:
Screening mammogram in one year. (Code:AS-G-LCT)

BI-RADS CATEGORY  1: Negative.

## 2018-02-16 ENCOUNTER — Encounter: Payer: Self-pay | Admitting: Family Medicine

## 2018-07-16 ENCOUNTER — Emergency Department (HOSPITAL_COMMUNITY)
Admission: EM | Admit: 2018-07-16 | Discharge: 2018-07-16 | Disposition: A | Discharge status: Home / Self Care | Payer: BLUE CROSS/BLUE SHIELD | Attending: Emergency Medicine | Admitting: Emergency Medicine

## 2018-07-16 ENCOUNTER — Other Ambulatory Visit: Payer: Self-pay

## 2018-07-16 ENCOUNTER — Encounter (HOSPITAL_COMMUNITY): Payer: Self-pay | Admitting: Emergency Medicine

## 2018-07-16 ENCOUNTER — Ambulatory Visit: Payer: Self-pay | Admitting: *Deleted

## 2018-07-16 ENCOUNTER — Emergency Department (HOSPITAL_COMMUNITY): Payer: BLUE CROSS/BLUE SHIELD

## 2018-07-16 DIAGNOSIS — E782 Mixed hyperlipidemia: Secondary | ICD-10-CM | POA: Insufficient documentation

## 2018-07-16 DIAGNOSIS — K5732 Diverticulitis of large intestine without perforation or abscess without bleeding: Secondary | ICD-10-CM | POA: Diagnosis not present

## 2018-07-16 DIAGNOSIS — R1032 Left lower quadrant pain: Secondary | ICD-10-CM | POA: Diagnosis not present

## 2018-07-16 DIAGNOSIS — K5792 Diverticulitis of intestine, part unspecified, without perforation or abscess without bleeding: Secondary | ICD-10-CM | POA: Diagnosis not present

## 2018-07-16 DIAGNOSIS — I10 Essential (primary) hypertension: Secondary | ICD-10-CM | POA: Diagnosis not present

## 2018-07-16 DIAGNOSIS — K5730 Diverticulosis of large intestine without perforation or abscess without bleeding: Secondary | ICD-10-CM | POA: Diagnosis not present

## 2018-07-16 DIAGNOSIS — Z79899 Other long term (current) drug therapy: Secondary | ICD-10-CM | POA: Diagnosis not present

## 2018-07-16 LAB — URINALYSIS, ROUTINE W REFLEX MICROSCOPIC
Bilirubin Urine: NEGATIVE
Glucose, UA: NEGATIVE mg/dL
Ketones, ur: 5 mg/dL — AB
Leukocytes,Ua: NEGATIVE
Nitrite: NEGATIVE
Protein, ur: NEGATIVE mg/dL
Specific Gravity, Urine: 1.008 (ref 1.005–1.030)
pH: 7 (ref 5.0–8.0)

## 2018-07-16 LAB — CBC
HEMATOCRIT: 42.6 % (ref 36.0–46.0)
Hemoglobin: 14.2 g/dL (ref 12.0–15.0)
MCH: 30.3 pg (ref 26.0–34.0)
MCHC: 33.3 g/dL (ref 30.0–36.0)
MCV: 91 fL (ref 80.0–100.0)
Platelets: 283 10*3/uL (ref 150–400)
RBC: 4.68 MIL/uL (ref 3.87–5.11)
RDW: 11.7 % (ref 11.5–15.5)
WBC: 13.3 10*3/uL — AB (ref 4.0–10.5)
nRBC: 0 % (ref 0.0–0.2)

## 2018-07-16 LAB — COMPREHENSIVE METABOLIC PANEL
ALT: 16 U/L (ref 0–44)
AST: 17 U/L (ref 15–41)
Albumin: 4.1 g/dL (ref 3.5–5.0)
Alkaline Phosphatase: 62 U/L (ref 38–126)
Anion gap: 8 (ref 5–15)
BUN: 9 mg/dL (ref 6–20)
CHLORIDE: 105 mmol/L (ref 98–111)
CO2: 24 mmol/L (ref 22–32)
Calcium: 8.9 mg/dL (ref 8.9–10.3)
Creatinine, Ser: 0.92 mg/dL (ref 0.44–1.00)
GFR calc Af Amer: >60 mL/min (ref >60)
GFR calc non Af Amer: >60 mL/min (ref >60)
Glucose, Bld: 110 mg/dL — ABNORMAL HIGH (ref 70–99)
Potassium: 3.7 mmol/L (ref 3.5–5.1)
Sodium: 137 mmol/L (ref 135–145)
Total Bilirubin: 0.8 mg/dL (ref 0.3–1.2)
Total Protein: 7.4 g/dL (ref 6.5–8.1)

## 2018-07-16 LAB — LIPASE, BLOOD: Lipase: 26 U/L (ref 11–51)

## 2018-07-16 LAB — I-STAT BETA HCG BLOOD, ED (MC, WL, AP ONLY)

## 2018-07-16 MED ORDER — CIPROFLOXACIN HCL 500 MG PO TABS: 500.0000 mg | ORAL_TABLET | Freq: Two times a day (BID) | ORAL | 0 refills | Status: AC

## 2018-07-16 MED ORDER — SODIUM CHLORIDE 0.9% FLUSH
3.0000 mL | Freq: Once | INTRAVENOUS | Status: AC
  Administered 2018-07-16: 3 mL via INTRAVENOUS

## 2018-07-16 MED ORDER — SODIUM CHLORIDE 0.9 % IV BOLUS
1000.0000 mL | Freq: Once | INTRAVENOUS | Status: AC
  Administered 2018-07-16: 1000 mL via INTRAVENOUS

## 2018-07-16 MED ORDER — FENTANYL CITRATE (PF) 100 MCG/2ML IJ SOLN
50.0000 ug | Freq: Once | INTRAMUSCULAR | Status: AC
  Administered 2018-07-16: 50 ug via INTRAVENOUS
  Filled 2018-07-16: qty 2

## 2018-07-16 MED ORDER — OXYCODONE-ACETAMINOPHEN 5-325 MG PO TABS: 2.0000 | ORAL_TABLET | ORAL | 0 refills | Status: AC | PRN

## 2018-07-16 MED ORDER — METRONIDAZOLE 500 MG PO TABS: 500.0000 mg | ORAL_TABLET | Freq: Two times a day (BID) | ORAL | 0 refills | Status: DC

## 2018-07-16 MED ORDER — IOHEXOL 300 MG/ML  SOLN
100.0000 mL | Freq: Once | INTRAMUSCULAR | Status: AC | PRN
  Administered 2018-07-16: 100 mL via INTRAVENOUS

## 2018-07-16 MED ORDER — ONDANSETRON HCL 4 MG/2ML IJ SOLN
4.0000 mg | Freq: Once | INTRAMUSCULAR | Status: AC
  Administered 2018-07-16: 4 mg via INTRAVENOUS
  Filled 2018-07-16: qty 2

## 2018-07-16 NOTE — ED Provider Notes (Signed)
[facility] EMERGENCY DEPARTMENT Provider Note   CSN: [id number] Arrival date & time: 07/16/18  1235  History   Chief Complaint Chief Complaint  Patient presents with  . Abdominal Pain   HPI Donna [name] is a 53 y.o. female with past medical history significant for diverticulitis, chronic constipation, hypertension who presents for evaluation of left lower quadrant abdominal pain.  Patient states she has had left lower quadrant, pain x2 days.  States if she lays still she is not pain, however if she moves or if she touches her stomach she has severe pain.  Rates her pain a 10/10.  Patient states her pain radiates from the left lower quadrant to her right lower quadrant.  Patient states her last diverticulitis "flare" was last year.  Was followed by Dr. Laurell Josephs with general surgery for this.  Has taken Cipro and Flagyl for this in the past. Has not recently followed up.  Patient states she did call her GI physician who told her she would need to be evaluated.  Patient states her last CT scan did show a "kink in my intestines."  Denies fever, chills, emesis, chest pain, shortness of breath, dysuria, diarrhea, constipation, melena, bright red blood per rectum.  Patient states she has had 3 "normal" bowel movements today.  No episodes of emesis, however has had persistent nausea.  Denies additional aggravating or alleviating factors.  Has not taken anything for pain PTA.  Previous Abd surgeries include laparoscopic evaluation for endometriosis, adhesions found at that time.  History obtained from patient.  No interpreter was used.   HPI  Past Medical History:  Diagnosis Date  . Allergy   . Anxiety   . Constipation   . Depression   . Diverticulitis   . Elevated BP without diagnosis of hypertension   . GERD (gastroesophageal reflux disease)   . HTN (hypertension)   . IBS (irritable bowel syndrome)   . Night muscle spasms    back  . Osteoarthritis (arthritis due to wear  and tear of joints)    knees  . Ovarian cyst   . Pain in the abdomen    lower left quarter  . Palpitations   . SOB (shortness of breath) on exertion   . Swelling    feet and legs    Patient Active Problem List   Diagnosis Date Noted  . Depression 11/14/2016  . Class 1 obesity with serious comorbidity and body mass index (BMI) of 30.0 to 30.9 in adult 11/14/2016  . Vitamin D deficiency 10/04/2016  . Mixed hyperlipidemia 10/04/2016  . Class 1 obesity without serious comorbidity with body mass index (BMI) of 30.0 to 30.9 in adult 10/04/2016  . LLQ pain 06/16/2016    Past Surgical History:  Procedure Laterality Date  . ABDOMINAL ADHESION SURGERY    . acl replacement  2000  . ovarian cysts       OB History    Gravida  2   Para  2   Term  2   Preterm      AB      Living  2     SAB      TAB      Ectopic      Multiple      Live Births               Home Medications    Prior to Admission medications   Medication Sig Start Date End Date Taking? Authorizing Provider  ibuprofen (  ADVIL,MOTRIN) 200 MG tablet Take 200 mg by mouth every 6 (six) hours as needed for moderate pain.   Yes [provider]  levonorgestrel (MIRENA) 20 MCG/24HR IUD 1 each by Intrauterine route once.   Yes [provider]  omeprazole (PRILOSEC) 10 MG capsule Take 10 mg by mouth daily.   Yes [provider]  OVER THE COUNTER MEDICATION Take 4 Doses by mouth daily as needed (for stomach). OTC Fiber Well gummies   Yes [provider]  ciprofloxacin (CIPRO) 500 MG tablet Take 1 tablet (500 mg total) by mouth 2 (two) times daily for 5 days. 07/16/18 07/21/18  Cammeron Greis A, PA-C  metroNIDAZOLE (FLAGYL) 500 MG tablet Take 1 tablet (500 mg total) by mouth 2 (two) times daily. 07/16/18   Shanik Brookshire A, PA-C  oxyCODONE-acetaminophen (PERCOCET/ROXICET) 5-325 MG tablet Take 2 tablets by mouth every 4 (four) hours as needed for up to 3 days for severe pain.  07/16/18 07/19/18  Assunta Pupo A, PA-C  Vitamin D, Ergocalciferol, (DRISDOL) 50000 units CAPS capsule Take 1 capsule (50,000 Units total) by mouth every 7 (seven) days. Patient not taking: Reported on 12/11/2017 02/22/17   Demetrio Lapping, PA-C  Vitamin D, Ergocalciferol, (DRISDOL) 50000 units CAPS capsule TAKE 1 CAPSULE (50,000 UNITS TOTAL) BY MOUTH EVERY 7 (SEVEN) DAYS. Patient not taking: Reported on 12/11/2017 04/04/17   Demetrio Lapping, PA-C    Family History Family History  Problem Relation Age of Onset  . Heart disease Father   . Hypertension Father   . Stroke Father   . Diabetes Father   . Hyperlipidemia Father   . Kidney disease Father   . Sleep apnea Father   . Obesity Father   . Breast cancer Maternal Aunt   . Breast cancer Maternal Grandmother   . Anxiety disorder Mother     Social History Social History   Tobacco Use  . Smoking status: Never Smoker  . Smokeless tobacco: Never Used  Substance Use Topics  . Alcohol use: Yes    Comment: occassionally  . Drug use: No     Allergies   Epinephrine; Augmentin [amoxicillin-pot clavulanate]; Codeine; Cortisone; Levaquin [levofloxacin in d5w]; Omnipred [prednisolone acetate]; Sulfa antibiotics; Sulfasalazine; Tequin [gatifloxacin]; and Cefdinir   Review of Systems Review of Systems  Constitutional: Negative.   HENT: Negative.   Eyes: Negative.   Respiratory: Negative.   Cardiovascular: Negative.   Gastrointestinal: Positive for abdominal pain and nausea. Negative for abdominal distention, anal bleeding, blood in stool, constipation, diarrhea, rectal pain and vomiting.  Genitourinary: Negative.   Musculoskeletal: Negative.   Skin: Negative.   Neurological: Negative.   All other systems reviewed and are negative.    Physical Exam Updated Vital Signs BP (!) 126/96 (BP Location: Right Arm)   Pulse 84   Temp 97.7 F (36.5 C) (Oral)   Resp 14   SpO2 97%   Physical Exam Vitals signs and nursing note reviewed.    Constitutional:      General: She is not in acute distress.    Appearance: She is well-developed. She is not ill-appearing, toxic-appearing or diaphoretic.     Comments: Sitting in bed on exam for initial evaluation.  No acute distress noted.  Patient requesting food and drink at this time.  HENT:     Head: Atraumatic.     Mouth/Throat:     Comments: Posterior oropharynx clear.  Mucous membranes moist. Eyes:     Pupils: Pupils are equal, round, and reactive to light.  Neck:     Musculoskeletal: Normal range of motion.  Cardiovascular:     Rate and Rhythm: Normal rate.     Heart sounds: Normal heart sounds.  Pulmonary:     Effort: No respiratory distress.     Comments: Clear to auscultation bilaterally no wheeze, rhonchi or rales.  Accessory muscle usage.  Speaking full sentences. Abdominal:     General: There is no distension.     Comments: Soft without rebound.  Generalized tenderness to left and right lower quadrant.  There is guarding present.  Normoactive bowel sounds.  Musculoskeletal: Normal range of motion.     Comments: Moves all 4 extremities without difficulty.  Ambulatory in department that difficulty.  Skin:    General: Skin is warm and dry.     Comments: Brisk capillary refill.  Neurological:     Mental Status: She is alert.      ED Treatments / Results  Labs (all labs ordered are listed, but only abnormal results are displayed) Labs Reviewed  COMPREHENSIVE METABOLIC PANEL - Abnormal; Notable for the following components:      Result Value   Glucose, Bld 110 (*)    All other components within normal limits  CBC - Abnormal; Notable for the following components:   WBC 13.3 (*)    All other components within normal limits  URINALYSIS, ROUTINE W REFLEX MICROSCOPIC - Abnormal; Notable for the following components:   Color, Urine STRAW (*)    Hgb urine dipstick MODERATE (*)    Ketones, ur 5 (*)    Bacteria, UA RARE (*)    All other components within normal  limits  LIPASE, BLOOD  I-STAT BETA HCG BLOOD, ED ([facility], [facility], [facility] ONLY)    EKG None  Radiology Ct Abdomen Pelvis W Contrast  Result Date: 07/16/2018 CLINICAL DATA:  Abdominal pain with diverticulitis suspected EXAM: CT ABDOMEN AND PELVIS WITH CONTRAST TECHNIQUE: Multidetector CT imaging of the abdomen and pelvis was performed using the standard protocol following bolus administration of intravenous contrast. CONTRAST:  100mL OMNIPAQUE IOHEXOL 300 MG/ML  SOLN COMPARISON:  09/06/2016 FINDINGS: Lower chest:  No contributory findings. Hepatobiliary: No focal liver abnormality.No evidence of biliary obstruction or stone. Pancreas: Unremarkable. Spleen: Unremarkable. Adrenals/Urinary Tract: Negative adrenals. No hydronephrosis or stone. Unremarkable bladder. Stomach/Bowel: Short segment of sigmoid wall thickening and fat inflammation around an indistinct and thickened diverticulum. No abscess or perforation. Left colonic diverticulosis is moderate to extensive. No appendicitis. Vascular/Lymphatic: No acute vascular abnormality. No mass or adenopathy. Reproductive:IUD in place. Other: No ascites or pneumoperitoneum. Musculoskeletal: No acute abnormalities.  Osteitis condensans iliac. IMPRESSION: Uncomplicated sigmoid diverticulitis. Electronically Signed   By: Marnee SpringJonathon  Watts M.D.   On: 07/16/2018 19:21    Procedures Procedures (including critical care time)  Medications Ordered in ED Medications  sodium chloride flush (NS) 0.9 % injection 3 mL (3 mLs Intravenous Given 07/16/18 1812)  ondansetron (ZOFRAN) injection 4 mg (4 mg Intravenous Given 07/16/18 1812)  fentaNYL (SUBLIMAZE) injection 50 mcg (50 mcg Intravenous Given 07/16/18 1812)  sodium chloride 0.9 % bolus 1,000 mL (0 mLs Intravenous Stopped 07/16/18 2104)  iohexol (OMNIPAQUE) 300 MG/ML solution 100 mL (100 mLs Intravenous Contrast Given 07/16/18 1904)     Initial Impression / Assessment and Plan / ED Course  I have reviewed the triage vital signs and  the nursing notes.  Pertinent labs & imaging results that were available during my care of the patient were reviewed by me and considered in my medical decision making (see  chart for details).  53 year old female appears otherwise well presents for evaluation of left lower quadrant pain.  Afebrile, nonseptic, non-ill-appearing.  Patient has a history of diverticulitis.  Abdomen tender to left lower quadrant as well as right lower quadrant.  There is guarding present.  No rebound.  Normoactive bowel sounds.  Mucous membranes moist.  Lungs clear to auscultation bilateral wheezing rhonchi rales.  Normal bowel movements without melena or bright red blood per rectum.  Was obtained from triage for metabolic panel without electrolyte, renal or liver abnormality, lipase 26, hCG negative, CBC with mild leukocytosis at 13.3. Urinalysis without evidence of infection. Will give fluids, antiemetics, pain medication, CT scan and reevaluate.  2000: CT scan with evidence of sigmoid diverticulitis. No evidence of abscess or perforation.  Patient has had pain controlled with pain medication in department. Discussed inpatient versus outpatient management diverticulitis given the severity of her pain on presentation.  Patient prefers outpatient management at this time.  Discussed risk versus benefit.  Patient voiced understanding of risk vs benefit and would like trial of outpatient therapy.  I feel this is an option at this time.  She is afebrile.  She has been able to tolerate p.o. intake in department without difficulty.    Patient is nontoxic, nonseptic appearing, in no apparent distress. Patient does not meet the SIRS or Sepsis criteria.  On repeat exam patient does not have a surgical abdomin and there are no peritoneal signs.  No indication of appendicitis, bowel obstruction, bowel perforation, cholecystitis, PID or ectopic pregnancy.  Patient has taken Cipro and Flagyl as well as Norco previously for her diverticulitis  episodes.  Will DC with these medications as she has not had issues given her extensive allergy list. These medications do, appear in her allergy list, however patient states "I have not had problems with these before."  Discussed reasons for her to return to the emergency department.  Discussed follow-up with GI as well as general surgery given her recurrent multiple episodes of diverticulitis.  Patient hemodynamically stable and appropriate for DC home at this time.  Patient expresses understanding of return precautions and agrees with plan.     Final Clinical Impressions(s) / ED Diagnoses   Final diagnoses:  Diverticulitis    ED Discharge Orders         Ordered    oxyCODONE-acetaminophen (PERCOCET/ROXICET) 5-325 MG tablet  Every 4 hours PRN     07/16/18 2103    ciprofloxacin (CIPRO) 500 MG tablet  2 times daily     07/16/18 2103    metroNIDAZOLE (FLAGYL) 500 MG tablet  2 times daily     07/16/18 2103           Breon Diss A, PA-C 07/16/18 2254    Charlynne Pander, MD 07/17/18 1113

## 2018-07-16 NOTE — Discharge Instructions (Signed)
Your were evaluated today for abdominal pain.  CT scan showed non-complicated diverticulitis.  Please take antibiotics as prescribed.  I have also given you pain medication which have taken previously.  Please take as prescribed.  Follow with PCP or GI for evaluation.

## 2018-07-16 NOTE — Telephone Encounter (Signed)
See note

## 2018-07-16 NOTE — Telephone Encounter (Signed)
Pt called with complaints of abdominal pain and doubled over; this started on 07/15/2018; she says that her abdomen is sore; she says that it is her left lower abdomen; the pt has tried miraalax and magnesium citrate 1 month ago; she says that it is affecting her bladder, and she has a kink in her intestine; recommendations  made per nurse triage protocol; former pt of Dr [name], and scheduled to see Dr Jacquiline Doe, [facility], 08/08/2018; she verbalized understanding and will proceed to the ED; will route to office for notification      Reason for Disposition . [1] SEVERE pain (e.g., excruciating) AND [2] present > 1 hour  Answer Assessment - Initial Assessment Questions 1. LOCATION: "Where does it hurt?"      Left lower abdomen 2. RADIATION: "Does the pain shoot anywhere else?" (e.g., chest, back)     no 3. ONSET: "When did the pain begin?" (e.g., minutes, hours or days ago)      07/15/2018 4. SUDDEN: "Gradual or sudden onset?"     sudden 5. PATTERN "Does the pain come and go, or is it constant?"    - If constant: "Is it getting better, staying the same, or worsening?"      (Note: Constant means the pain never goes away completely; most serious pain is constant and it progresses)     - If intermittent: "How long does it last?" "Do you have pain now?"     (Note: Intermittent means the pain goes away completely between bouts)     constant 6. SEVERITY: "How bad is the pain?"  (e.g., Scale 1-10; mild, moderate, or severe)   - MILD (1-3): doesn't interfere with normal activities, abdomen soft and not tender to touch    - MODERATE (4-7): interferes with normal activities or awakens from sleep, tender to touch    - SEVERE (8-10): excruciating pain, doubled over, unable to do any normal activities      severe 7. RECURRENT SYMPTOM: "Have you ever had this type of abdominal pain before?" If so, ask: "When was the last time?" and "What happened that time?"      History of diverticultis; also has  kink in intestine 8. CAUSE: "What do you think is causing the abdominal pain?"     Kink in intestine or diverticulitis 9. RELIEVING/AGGRAVATING FACTORS: "What makes it better or worse?" (e.g., movement, antacids, bowel movement)   movement 10. OTHER SYMPTOMS: "Has there been any vomiting, diarrhea, constipation, or urine problems?"       Affecting bladder 11. PREGNANCY: "Is there any chance you are pregnant?" "When was your last menstrual period?"       n/a  Protocols used: ABDOMINAL PAIN - [facility]

## 2018-07-16 NOTE — ED Triage Notes (Addendum)
Pt here for eval of 2 days of LLQ abdominal pain radiating to the right side. Pt states pain with movement. Pt called her GI specialist and PCP could not see her. Pt has known hx of diverticulitis. Pt states "I just need the cipro and flagyl but no one would see me." Pt states she has a known "kink in her intestines" states it does feel like there is a long hard thing in her lower abd.

## 2018-07-16 NOTE — Telephone Encounter (Signed)
Noted  

## 2018-07-16 NOTE — ED Notes (Signed)
Patient verbalizes understanding of discharge instructions. Opportunity for questioning and answers were provided. Armband removed by staff, pt discharged from ED, ambulatory to lobby with significant other.  

## 2018-08-08 ENCOUNTER — Ambulatory Visit (INDEPENDENT_AMBULATORY_CARE_PROVIDER_SITE_OTHER): Payer: BLUE CROSS/BLUE SHIELD | Admitting: Family Medicine

## 2018-08-08 ENCOUNTER — Encounter: Payer: Self-pay | Admitting: Family Medicine

## 2018-08-08 ENCOUNTER — Other Ambulatory Visit: Payer: Self-pay

## 2018-08-08 VITALS — BP 108/68 | HR 93 | Temp 98.1°F | Ht 63.5 in | Wt 175.6 lb

## 2018-08-08 DIAGNOSIS — E559 Vitamin D deficiency, unspecified: Secondary | ICD-10-CM | POA: Diagnosis not present

## 2018-08-08 DIAGNOSIS — Z8719 Personal history of other diseases of the digestive system: Secondary | ICD-10-CM | POA: Diagnosis not present

## 2018-08-08 DIAGNOSIS — K219 Gastro-esophageal reflux disease without esophagitis: Secondary | ICD-10-CM | POA: Diagnosis not present

## 2018-08-08 DIAGNOSIS — F419 Anxiety disorder, unspecified: Secondary | ICD-10-CM | POA: Diagnosis not present

## 2018-08-08 MED ORDER — VITAMIN D (ERGOCALCIFEROL) 1.25 MG (50000 UNIT) PO CAPS: 50000.0000 [IU] | ORAL_CAPSULE | ORAL | 3 refills | Status: DC

## 2018-08-08 NOTE — Assessment & Plan Note (Signed)
Restart vitamin D 50,000 international units weekly.  Check vitamin D level at next blood draw.

## 2018-08-08 NOTE — Assessment & Plan Note (Signed)
Stable.  Continue omeprazole 10 mg daily as needed.

## 2018-08-08 NOTE — Assessment & Plan Note (Signed)
Benign abdominal exam.  Seems to be resolving.  Continue fiber supplementation and good oral hydration.  Discussed reasons to return to care and seek emergent care.

## 2018-08-08 NOTE — Patient Instructions (Signed)
It was very nice to see you today!  I will refill your vitamin D  Please let me know if your diverticulitis comes back.  Come back in 6 -12 months for your annual physical with blood work, or sooner as needed.  Take care, Dr Jimmey Ralph

## 2018-08-08 NOTE — Progress Notes (Signed)
Chief Complaint:  Donna [name] is a 53 y.o. female who presents today with a chief complaint of diverticulitis and to establish care.   Assessment/Plan:  Vitamin D deficiency Restart vitamin D 50,000 international units weekly.  Check vitamin D level at next blood draw.  History of diverticulitis Benign abdominal exam.  Seems to be resolving.  Continue fiber supplementation and good oral hydration.  Discussed reasons to return to care and seek emergent care.  Gastroesophageal reflux disease Stable.  Continue omeprazole 10 mg daily as needed.  Anxiety Stable.  Continue sporadic Xanax as needed.  Given the symptoms only require treatment once or twice a year, we will not start daily medication today.  Discussed reasons to return to care.  Preventative Healthcare Patient was instructed to return soon for CPE. Health Maintenance Due  Topic Date Due  . TETANUS/TDAP  06/13/1985  . MAMMOGRAM  11/08/2017     Subjective:  HPI:  Her stable, chronic medical conditions are outlined below:   # History of diverticulitis -Has been seen by GI and surgery in the past. -Several year history.  Had a recent flare that ended up in an ED visit. -She just finished a course of Cipro and Flagyl has had significant provement in her symptoms. -Takes fiber well Gummies as preventative therapy.  # GERD - Takes omeprazole 10mg  as needed.  Tolerating well.  # Anxiety - Takes xanax 1-2 times per year - ROS: No reported SI or HI.  # Vitamin D Deficiency -Has been off vitamin D supplements for several months.  Would like to restart today. -She notices improvement in energy levels and mood while on vitamin D replacement.  ROS: Per HPI, otherwise a complete review of systems was negative.   PMH:  The following were reviewed and entered/updated in epic: Past Medical History:  Diagnosis Date  . Allergy   . Anxiety   . Constipation   . Depression   . Diverticulitis   . Elevated BP without  diagnosis of hypertension   . GERD (gastroesophageal reflux disease)   . HTN (hypertension)   . IBS (irritable bowel syndrome)   . Night muscle spasms    back  . Osteoarthritis (arthritis due to wear and tear of joints)    knees  . Ovarian cyst   . Pain in the abdomen    lower left quarter  . Palpitations   . SOB (shortness of breath) on exertion   . Swelling    feet and legs   Patient Active Problem List   Diagnosis Date Noted  . History of diverticulitis 08/08/2018  . Gastroesophageal reflux disease 08/08/2018  . Anxiety 08/08/2018  . Vitamin D deficiency 10/04/2016  . Mixed hyperlipidemia 10/04/2016   Past Surgical History:  Procedure Laterality Date  . ABDOMINAL ADHESION SURGERY    . acl replacement  2000  . ovarian cysts      Family History  Problem Relation Age of Onset  . Heart disease Father   . Hypertension Father   . Stroke Father   . Diabetes Father   . Hyperlipidemia Father   . Kidney disease Father   . Sleep apnea Father   . Obesity Father   . Breast cancer Maternal Aunt   . Breast cancer Maternal Grandmother   . Anxiety disorder Mother     Medications- reviewed and updated Current Outpatient Medications  Medication Sig Dispense Refill  . ibuprofen (ADVIL,MOTRIN) 200 MG tablet Take 200 mg by mouth every 6 (six) hours  as needed for moderate pain.    Marland Kitchen levonorgestrel (MIRENA) 20 MCG/24HR IUD 1 each by Intrauterine route once.    Marland Kitchen omeprazole (PRILOSEC) 10 MG capsule Take 10 mg by mouth daily.    Marland Kitchen OVER THE COUNTER MEDICATION Take 4 Doses by mouth daily as needed (for stomach). OTC Fiber Well gummies    . Vitamin D, Ergocalciferol, (DRISDOL) 1.25 MG (50000 UT) CAPS capsule Take 1 capsule (50,000 Units total) by mouth every 7 (seven) days. 12 capsule 3   No current facility-administered medications for this visit.     Allergies-reviewed and updated Allergies  Allergen Reactions  . Epinephrine Other (See Comments)    Shakes like shes having a  seizure  . Augmentin [Amoxicillin-Pot Clavulanate] Other (See Comments)    States it burns her inside   . Codeine Hives and Other (See Comments)    Upset stomach  . Cortisone Swelling  . Levaquin [Levofloxacin In D5w] Other (See Comments)    Blisters on hands  . Omnipred [Prednisolone Acetate] Other (See Comments)    Migraine, emesis  . Sulfa Antibiotics Nausea And Vomiting  . Sulfasalazine Nausea And Vomiting  . Tequin [Gatifloxacin] Other (See Comments)    Blisters on hands  . Cefdinir Nausea And Vomiting    Reports blisters on hands and feet and migraines.    Social History   Socioeconomic History  . Marital status: Divorced    Spouse name: Not on file  . Number of children: 2  . Years of education: Not on file  . Highest education level: Not on file  Occupational History  . Occupation: Associate Professor  Social Needs  . Financial resource strain: Not on file  . Food insecurity:    Worry: Not on file    Inability: Not on file  . Transportation needs:    Medical: Not on file    Non-medical: Not on file  Tobacco Use  . Smoking status: Never Smoker  . Smokeless tobacco: Never Used  Substance and Sexual Activity  . Alcohol use: Yes    Comment: occassionally  . Drug use: No  . Sexual activity: Yes    Birth control/protection: I.U.D.  Lifestyle  . Physical activity:    Days per week: Not on file    Minutes per session: Not on file  . Stress: Not on file  Relationships  . Social connections:    Talks on phone: Not on file    Gets together: Not on file    Attends religious service: Not on file    Active member of club or organization: Not on file    Attends meetings of clubs or organizations: Not on file    Relationship status: Not on file  Other Topics Concern  . Not on file  Social History Narrative  . Not on file         Objective:  Physical Exam: BP 108/68 (BP Location: Left Arm, Patient Position: Sitting, Cuff Size: Normal)   Pulse 93   Temp 98.1 F  (36.7 C) (Oral)   Ht 5' 3.5" (1.613 m)   Wt 175 lb 9.6 oz (79.7 kg)   SpO2 98%   BMI 30.62 kg/m   Gen: NAD, resting comfortably CV: Regular rate and rhythm with no murmurs appreciated Pulm: Normal work of breathing, clear to auscultation bilaterally with no crackles, wheezes, or rhonchi GI: Normal bowel sounds present. Soft, Nontender, Nondistended. MSK: No edema, cyanosis, or clubbing noted Skin: Warm, dry Neuro: Grossly normal, moves all extremities Psych:  Normal affect and thought content     Ginger Leeth M. Jimmey Ralph, MD 08/08/2018 9:56 AM

## 2018-08-08 NOTE — Assessment & Plan Note (Signed)
Stable.  Continue sporadic Xanax as needed.  Given the symptoms only require treatment once or twice a year, we will not start daily medication today.  Discussed reasons to return to care.

## 2018-10-22 ENCOUNTER — Other Ambulatory Visit: Payer: Self-pay | Admitting: Family Medicine

## 2018-10-22 ENCOUNTER — Ambulatory Visit: Payer: BLUE CROSS/BLUE SHIELD

## 2018-10-22 ENCOUNTER — Ambulatory Visit: Payer: BC Managed Care – PPO

## 2018-10-22 DIAGNOSIS — Z1231 Encounter for screening mammogram for malignant neoplasm of breast: Secondary | ICD-10-CM

## 2018-10-30 DIAGNOSIS — M9902 Segmental and somatic dysfunction of thoracic region: Secondary | ICD-10-CM | POA: Diagnosis not present

## 2018-10-30 DIAGNOSIS — M9901 Segmental and somatic dysfunction of cervical region: Secondary | ICD-10-CM | POA: Diagnosis not present

## 2018-10-30 DIAGNOSIS — M62838 Other muscle spasm: Secondary | ICD-10-CM | POA: Diagnosis not present

## 2018-10-30 DIAGNOSIS — M546 Pain in thoracic spine: Secondary | ICD-10-CM | POA: Diagnosis not present

## 2018-11-02 ENCOUNTER — Other Ambulatory Visit: Payer: Self-pay

## 2018-11-02 ENCOUNTER — Encounter: Payer: Self-pay | Admitting: Family Medicine

## 2018-11-02 ENCOUNTER — Ambulatory Visit (INDEPENDENT_AMBULATORY_CARE_PROVIDER_SITE_OTHER): Payer: BC Managed Care – PPO | Admitting: Family Medicine

## 2018-11-02 VITALS — BP 110/74 | HR 85 | Temp 98.4°F | Ht 63.5 in | Wt 179.2 lb

## 2018-11-02 DIAGNOSIS — K219 Gastro-esophageal reflux disease without esophagitis: Secondary | ICD-10-CM

## 2018-11-02 DIAGNOSIS — Z6831 Body mass index (BMI) 31.0-31.9, adult: Secondary | ICD-10-CM

## 2018-11-02 DIAGNOSIS — E785 Hyperlipidemia, unspecified: Secondary | ICD-10-CM | POA: Diagnosis not present

## 2018-11-02 DIAGNOSIS — L659 Nonscarring hair loss, unspecified: Secondary | ICD-10-CM | POA: Diagnosis not present

## 2018-11-02 DIAGNOSIS — Z0001 Encounter for general adult medical examination with abnormal findings: Secondary | ICD-10-CM | POA: Diagnosis not present

## 2018-11-02 DIAGNOSIS — R739 Hyperglycemia, unspecified: Secondary | ICD-10-CM | POA: Diagnosis not present

## 2018-11-02 DIAGNOSIS — F419 Anxiety disorder, unspecified: Secondary | ICD-10-CM

## 2018-11-02 DIAGNOSIS — M549 Dorsalgia, unspecified: Secondary | ICD-10-CM | POA: Diagnosis not present

## 2018-11-02 DIAGNOSIS — E559 Vitamin D deficiency, unspecified: Secondary | ICD-10-CM

## 2018-11-02 LAB — LIPID PANEL
Cholesterol: 206 mg/dL — ABNORMAL HIGH (ref 0–200)
HDL: 43.1 mg/dL (ref >39.00)
LDL Cholesterol: 127 mg/dL — ABNORMAL HIGH (ref 0–99)
NonHDL: 163.37
Total CHOL/HDL Ratio: 5
Triglycerides: 180 mg/dL — ABNORMAL HIGH (ref 0.0–149.0)
VLDL: 36 mg/dL (ref 0.0–40.0)

## 2018-11-02 LAB — COMPREHENSIVE METABOLIC PANEL
ALT: 15 U/L (ref 0–35)
AST: 13 U/L (ref 0–37)
Albumin: 4.5 g/dL (ref 3.5–5.2)
Alkaline Phosphatase: 55 U/L (ref 39–117)
BUN: 21 mg/dL (ref 6–23)
CO2: 28 mEq/L (ref 19–32)
Calcium: 9.1 mg/dL (ref 8.4–10.5)
Chloride: 103 mEq/L (ref 96–112)
Creatinine, Ser: 1.08 mg/dL (ref 0.40–1.20)
GFR: 53.19 mL/min — ABNORMAL LOW (ref >60.00)
Glucose, Bld: 104 mg/dL — ABNORMAL HIGH (ref 70–99)
Potassium: 4.8 mEq/L (ref 3.5–5.1)
Sodium: 138 mEq/L (ref 135–145)
Total Bilirubin: 0.4 mg/dL (ref 0.2–1.2)
Total Protein: 7.3 g/dL (ref 6.0–8.3)

## 2018-11-02 LAB — HEMOGLOBIN A1C: Hgb A1c MFr Bld: 5.7 % (ref 4.6–6.5)

## 2018-11-02 LAB — CBC
HCT: 43.3 % (ref 36.0–46.0)
Hemoglobin: 14.7 g/dL (ref 12.0–15.0)
MCHC: 33.9 g/dL (ref 30.0–36.0)
MCV: 93 fl (ref 78.0–100.0)
Platelets: 313 10*3/uL (ref 150.0–400.0)
RBC: 4.66 Mil/uL (ref 3.87–5.11)
RDW: 12.9 % (ref 11.5–15.5)
WBC: 5.8 10*3/uL (ref 4.0–10.5)

## 2018-11-02 LAB — VITAMIN D 25 HYDROXY (VIT D DEFICIENCY, FRACTURES): VITD: 54.88 ng/mL (ref 30.00–100.00)

## 2018-11-02 LAB — TSH: TSH: 1.22 u[IU]/mL (ref 0.35–4.50)

## 2018-11-02 LAB — VITAMIN B12: Vitamin B-12: 224 pg/mL (ref 211–911)

## 2018-11-02 MED ORDER — METHOCARBAMOL 500 MG PO TABS: 500.0000 mg | ORAL_TABLET | Freq: Three times a day (TID) | ORAL | 1 refills | Status: DC | PRN

## 2018-11-02 NOTE — Progress Notes (Signed)
Chief Complaint:  Donna [name] is a 53 y.o. female who presents today for her annual comprehensive physical exam.    Assessment/Plan:  Anxiety Stable without daily medications.   Gastroesophageal reflux disease Stable.  Continue Prilosec 10 mg daily.  Check B12.  Vitamin D deficiency Continue 50,000 international units weekly.  Check vitamin D level today.  Dyslipidemia Check lipid panel  Hyperglycemia Check A1c.   Alopecia We will check CBC, C met, TSH, B12, and vitamin D.  Recommended Rogaine.  Muscle Tightness No red flags.  Will send refill for Robaxin.  Continue heating pad and ibuprofen.  She will continue seeing chiropractor as well.  BMI 31 Discussed lifestyle modifications  Preventative Healthcare: Will follow up with GYN for pap smear. Due for mammogram.  Patient Counseling(The following topics were reviewed and/or handout was given):  -Nutrition: Stressed importance of moderation in sodium/caffeine intake, saturated fat and cholesterol, caloric balance, sufficient intake of fresh fruits, vegetables, and fiber.  -Stressed the importance of regular exercise.   -Substance Abuse: Discussed cessation/primary prevention of tobacco, alcohol, or other drug use; driving or other dangerous activities under the influence; availability of treatment for abuse.   -Injury prevention: Discussed safety belts, safety helmets, smoke detector, smoking near bedding or upholstery.   -Sexuality: Discussed sexually transmitted diseases, partner selection, use of condoms, avoidance of unintended pregnancy and contraceptive alternatives.   -Dental health: Discussed importance of regular tooth brushing, flossing, and dental visits.  -Health maintenance and immunizations reviewed. Please refer to Health maintenance section.  Return to care in 1 year for next preventative visit.     Subjective:  HPI:  She has no acute complaints today.   She has had some worsening muscle  tightness in her upper back and neck over the past several weeks.  She has been working more and thinks that this could have contributed.  She works as a Haematologist.  She has seen Restaurant manager, fast food and massage therapist however symptoms are not improved.  She has been on Robaxin in the past which helped for similar episodes.  She would like a refill on this today.  She is also been trying to use heating pad and ibuprofen which is helped.  She is also concerned because her hair is been thinning for the past several months and over the past couple of years.  She is concerned about possible hormone imbalance.  She has not tried anything for thinning hair.  Lifestyle Diet: No specific diets or eating plans.  Exercise: Likes to swim and walk her dog.   Depression screen [facility] 2/9 11/02/2018  Decreased Interest 0  Down, Depressed, Hopeless 0  PHQ - 2 Score 0  Altered sleeping -  Tired, decreased energy -  Change in appetite -  Feeling bad or failure about yourself  -  Trouble concentrating -  Moving slowly or fidgety/restless -  PHQ-9 Score -   Health Maintenance Due  Topic Date Due  . MAMMOGRAM  11/08/2017  . PAP SMEAR-Modifier  10/20/2018    ROS: Per HPI, otherwise a complete review of systems was negative.   PMH:  The following were reviewed and entered/updated in epic: Past Medical History:  Diagnosis Date  . Allergy   . Anxiety   . Constipation   . Depression   . Diverticulitis   . Elevated BP without diagnosis of hypertension   . GERD (gastroesophageal reflux disease)   . HTN (hypertension)   . IBS (irritable bowel syndrome)   . Night muscle spasms  back  . Osteoarthritis (arthritis due to wear and tear of joints)    knees  . Ovarian cyst   . Pain in the abdomen    lower left quarter  . Palpitations   . SOB (shortness of breath) on exertion   . Swelling    feet and legs   Patient Active Problem List   Diagnosis Date Noted  . Gastroesophageal reflux disease 08/08/2018   . Anxiety 08/08/2018  . Vitamin D deficiency 10/04/2016   Past Surgical History:  Procedure Laterality Date  . ABDOMINAL ADHESION SURGERY    . acl replacement  2000  . ovarian cysts      Family History  Problem Relation Age of Onset  . Heart disease Father   . Hypertension Father   . Stroke Father   . Diabetes Father   . Hyperlipidemia Father   . Kidney disease Father   . Sleep apnea Father   . Obesity Father   . Breast cancer Maternal Aunt   . Breast cancer Maternal Grandmother   . Anxiety disorder Mother    Medications- reviewed and updated Current Outpatient Medications  Medication Sig Dispense Refill  . ibuprofen (ADVIL,MOTRIN) 200 MG tablet Take 200 mg by mouth every 6 (six) hours as needed for moderate pain.    Marland Kitchen levonorgestrel (MIRENA) 20 MCG/24HR IUD 1 each by Intrauterine route once.    Marland Kitchen omeprazole (PRILOSEC) 10 MG capsule Take 10 mg by mouth daily.    Marland Kitchen OVER THE COUNTER MEDICATION Take 4 Doses by mouth daily as needed (for stomach). OTC Fiber Well gummies    . Vitamin D, Ergocalciferol, (DRISDOL) 1.25 MG (50000 UT) CAPS capsule Take 1 capsule (50,000 Units total) by mouth every 7 (seven) days. 12 capsule 3  . methocarbamol (ROBAXIN) 500 MG tablet Take 1 tablet (500 mg total) by mouth every 8 (eight) hours as needed for muscle spasms. 30 tablet 1   No current facility-administered medications for this visit.     Allergies-reviewed and updated Allergies  Allergen Reactions  . Epinephrine Other (See Comments)    Shakes like shes having a seizure  . Augmentin [Amoxicillin-Pot Clavulanate] Other (See Comments)    States it burns her inside   . Codeine Hives and Other (See Comments)    Upset stomach  . Cortisone Swelling  . Levaquin [Levofloxacin In D5w] Other (See Comments)    Blisters on hands  . Omnipred [Prednisolone Acetate] Other (See Comments)    Migraine, emesis  . Sulfa Antibiotics Nausea And Vomiting  . Sulfasalazine Nausea And Vomiting  . Tequin  [Gatifloxacin] Other (See Comments)    Blisters on hands  . Cefdinir Nausea And Vomiting    Reports blisters on hands and feet and migraines.    Social History   Socioeconomic History  . Marital status: Married    Spouse name: Not on file  . Number of children: 2  . Years of education: Not on file  . Highest education level: Not on file  Occupational History  . Occupation: Theatre manager  Social Needs  . Financial resource strain: Not on file  . Food insecurity    Worry: Not on file    Inability: Not on file  . Transportation needs    Medical: Not on file    Non-medical: Not on file  Tobacco Use  . Smoking status: Never Smoker  . Smokeless tobacco: Never Used  Substance and Sexual Activity  . Alcohol use: Yes    Comment: occassionally  . Drug  use: No  . Sexual activity: Yes    Birth control/protection: I.U.D.  Lifestyle  . Physical activity    Days per week: Not on file    Minutes per session: Not on file  . Stress: Not on file  Relationships  . Social Herbalist on phone: Not on file    Gets together: Not on file    Attends religious service: Not on file    Active member of club or organization: Not on file    Attends meetings of clubs or organizations: Not on file    Relationship status: Not on file  Other Topics Concern  . Not on file  Social History Narrative  . Not on file        Objective:  Physical Exam: BP 110/74 (BP Location: Left Arm, Patient Position: Sitting, Cuff Size: Normal)   Pulse 85   Temp 98.4 F (36.9 C) (Oral)   Ht 5' 3.5" (1.613 m)   Wt 179 lb 4 oz (81.3 kg)   SpO2 98%   BMI 31.25 kg/m   Body mass index is 31.25 kg/m. Wt Readings from Last 3 Encounters:  11/02/18 179 lb 4 oz (81.3 kg)  08/08/18 175 lb 9.6 oz (79.7 kg)  12/11/17 172 lb 6.4 oz (78.2 kg)   Gen: NAD, resting comfortably HEENT: TMs normal bilaterally. OP clear. No thyromegaly noted.  CV: RRR with no murmurs appreciated Pulm: NWOB, CTAB with no  crackles, wheezes, or rhonchi GI: Normal bowel sounds present. Soft, Nontender, Nondistended. MSK: no edema, cyanosis, or clubbing noted.  Paraspinal muscles and upper back and neck tender to palpation. Skin: warm, dry Neuro: CN2-12 grossly intact. Strength 5/5 in upper and lower extremities. Reflexes symmetric and intact bilaterally.  Psych: Normal affect and thought content     Caleb M. Jerline Pain, MD 11/02/2018 10:58 AM

## 2018-11-02 NOTE — Assessment & Plan Note (Signed)
Stable.  Continue Prilosec 10 mg daily.  Check B12.

## 2018-11-02 NOTE — Patient Instructions (Signed)
It was very nice to see you today!  I will refill your robaxin.  We will check labs today.   Eat at least 3 REAL meals and 1-2 snacks per day.  Aim for no more than 5 hours between eating.  Eat breakfast within one hour of getting up.    Obtain twice as many fruits/vegetables as protein or carbohydrate foods for both lunch and dinner.   Cut down on sweet beverages. This includes juice, soda, and sweet tea.    Exercise at least 150 minutes every week.   Come back in 1 year for your next physical, or sooner as needed.   Take care, Dr Jerline Pain   Preventive Care 40-64 Years, Female Preventive care refers to lifestyle choices and visits with your health care provider that can promote health and wellness. What does preventive care include?   A yearly physical exam. This is also called an annual well check.  Dental exams once or twice a year.  Routine eye exams. Ask your health care provider how often you should have your eyes checked.  Personal lifestyle choices, including: ? Daily care of your teeth and gums. ? Regular physical activity. ? Eating a healthy diet. ? Avoiding tobacco and drug use. ? Limiting alcohol use. ? Practicing safe sex. ? Taking low-dose aspirin daily starting at age 44. ? Taking vitamin and mineral supplements as recommended by your health care provider. What happens during an annual well check? The services and screenings done by your health care provider during your annual well check will depend on your age, overall health, lifestyle risk factors, and family history of disease. Counseling Your health care provider may ask you questions about your:  Alcohol use.  Tobacco use.  Drug use.  Emotional well-being.  Home and relationship well-being.  Sexual activity.  Eating habits.  Work and work Statistician.  Method of birth control.  Menstrual cycle.  Pregnancy history. Screening You may have the following tests or measurements:   Height, weight, and BMI.  Blood pressure.  Lipid and cholesterol levels. These may be checked every 5 years, or more frequently if you are over 78 years old.  Skin check.  Lung cancer screening. You may have this screening every year starting at age 68 if you have a 30-pack-year history of smoking and currently smoke or have quit within the past 15 years.  Colorectal cancer screening. All adults should have this screening starting at age 4 and continuing until age 65. Your health care provider may recommend screening at age 86. You will have tests every 1-10 years, depending on your results and the type of screening test. People at increased risk should start screening at an earlier age. Screening tests may include: ? Guaiac-based fecal occult blood testing. ? Fecal immunochemical test (FIT). ? Stool DNA test. ? Virtual colonoscopy. ? Sigmoidoscopy. During this test, a flexible tube with a tiny camera (sigmoidoscope) is used to examine your rectum and lower colon. The sigmoidoscope is inserted through your anus into your rectum and lower colon. ? Colonoscopy. During this test, a long, thin, flexible tube with a tiny camera (colonoscope) is used to examine your entire colon and rectum.  Hepatitis C blood test.  Hepatitis B blood test.  Sexually transmitted disease (STD) testing.  Diabetes screening. This is done by checking your blood sugar (glucose) after you have not eaten for a while (fasting). You may have this done every 1-3 years.  Mammogram. This may be done every 1-2 years. Talk  to your health care provider about when you should start having regular mammograms. This may depend on whether you have a family history of breast cancer.  BRCA-related cancer screening. This may be done if you have a family history of breast, ovarian, tubal, or peritoneal cancers.  Pelvic exam and Pap test. This may be done every 3 years starting at age 4. Starting at age 51, this may be done every 5  years if you have a Pap test in combination with an HPV test.  Bone density scan. This is done to screen for osteoporosis. You may have this scan if you are at high risk for osteoporosis. Discuss your test results, treatment options, and if necessary, the need for more tests with your health care provider. Vaccines Your health care provider may recommend certain vaccines, such as:  Influenza vaccine. This is recommended every year.  Tetanus, diphtheria, and acellular pertussis (Tdap, Td) vaccine. You may need a Td booster every 10 years.  Varicella vaccine. You may need this if you have not been vaccinated.  Zoster vaccine. You may need this after age 21.  Measles, mumps, and rubella (MMR) vaccine. You may need at least one dose of MMR if you were born in 1957 or later. You may also need a second dose.  Pneumococcal 13-valent conjugate (PCV13) vaccine. You may need this if you have certain conditions and were not previously vaccinated.  Pneumococcal polysaccharide (PPSV23) vaccine. You may need one or two doses if you smoke cigarettes or if you have certain conditions.  Meningococcal vaccine. You may need this if you have certain conditions.  Hepatitis A vaccine. You may need this if you have certain conditions or if you travel or work in places where you may be exposed to hepatitis A.  Hepatitis B vaccine. You may need this if you have certain conditions or if you travel or work in places where you may be exposed to hepatitis B.  Haemophilus influenzae type b (Hib) vaccine. You may need this if you have certain conditions. Talk to your health care provider about which screenings and vaccines you need and how often you need them. This information is not intended to replace advice given to you by your health care provider. Make sure you discuss any questions you have with your health care provider. Document Released: 05/22/2015 Document Revised: 06/15/2017 Document Reviewed: 02/24/2015  Elsevier Interactive Patient Education  2019 [employer].

## 2018-11-02 NOTE — Assessment & Plan Note (Signed)
Continue 50,000 international units weekly.  Check vitamin D level today.

## 2018-11-02 NOTE — Assessment & Plan Note (Signed)
Stable without daily medications.

## 2018-11-05 ENCOUNTER — Encounter: Payer: Self-pay | Admitting: Family Medicine

## 2018-11-05 DIAGNOSIS — M9902 Segmental and somatic dysfunction of thoracic region: Secondary | ICD-10-CM | POA: Diagnosis not present

## 2018-11-05 DIAGNOSIS — M62838 Other muscle spasm: Secondary | ICD-10-CM | POA: Diagnosis not present

## 2018-11-05 DIAGNOSIS — R739 Hyperglycemia, unspecified: Secondary | ICD-10-CM | POA: Insufficient documentation

## 2018-11-05 DIAGNOSIS — E785 Hyperlipidemia, unspecified: Secondary | ICD-10-CM | POA: Insufficient documentation

## 2018-11-05 DIAGNOSIS — M9901 Segmental and somatic dysfunction of cervical region: Secondary | ICD-10-CM | POA: Diagnosis not present

## 2018-11-05 DIAGNOSIS — M546 Pain in thoracic spine: Secondary | ICD-10-CM | POA: Diagnosis not present

## 2018-11-05 NOTE — Progress Notes (Signed)
Please inform patient of the following:  B12 is on the low side. Can start her on the protocol if she is interested. Blood sugar and cholesterol are borderline but stable. Do not need to start medications, but she should work on diet and exercise and we can recheck in a year.   Everything else is normal.  Donna [name]. Jerline Pain, MD 11/05/2018 4:31 PM

## 2018-11-06 ENCOUNTER — Encounter: Payer: Self-pay | Admitting: Family Medicine

## 2018-11-08 ENCOUNTER — Ambulatory Visit (INDEPENDENT_AMBULATORY_CARE_PROVIDER_SITE_OTHER): Payer: BC Managed Care – PPO

## 2018-11-08 ENCOUNTER — Other Ambulatory Visit: Payer: Self-pay

## 2018-11-08 DIAGNOSIS — M546 Pain in thoracic spine: Secondary | ICD-10-CM | POA: Diagnosis not present

## 2018-11-08 DIAGNOSIS — E538 Deficiency of other specified B group vitamins: Secondary | ICD-10-CM

## 2018-11-08 DIAGNOSIS — M9902 Segmental and somatic dysfunction of thoracic region: Secondary | ICD-10-CM | POA: Diagnosis not present

## 2018-11-08 DIAGNOSIS — M9901 Segmental and somatic dysfunction of cervical region: Secondary | ICD-10-CM | POA: Diagnosis not present

## 2018-11-08 DIAGNOSIS — M62838 Other muscle spasm: Secondary | ICD-10-CM | POA: Diagnosis not present

## 2018-11-08 MED ORDER — CYANOCOBALAMIN 1000 MCG/ML IJ SOLN
1000.0000 ug | Freq: Once | INTRAMUSCULAR | Status: AC
  Administered 2018-11-08: 14:00:00 1000 ug via INTRAMUSCULAR

## 2018-11-08 NOTE — Progress Notes (Signed)
Per orders of Dr. Parker, injection of B12 injection given by Brendell L Tyus in left deltoid. Patient tolerated injection well. Patient will make appointment for 1week.   

## 2018-11-08 NOTE — Patient Instructions (Signed)
Health Maintenance Due  Topic Date Due  . MAMMOGRAM  11/08/2017  . PAP SMEAR-Modifier  10/20/2018    Depression screen [facility] 2/9 11/02/2018 08/08/2018 12/11/2017  Decreased Interest 0 0 0  Down, Depressed, Hopeless 0 0 0  PHQ - 2 Score 0 0 0  Altered sleeping - - -  Tired, decreased energy - - -  Change in appetite - - -  Feeling bad or failure about yourself  - - -  Trouble concentrating - - -  Moving slowly or fidgety/restless - - -  PHQ-9 Score - - -

## 2018-11-13 NOTE — Progress Notes (Signed)
I have reviewed the patient's encounter and agree with the documentation.  Algis Greenhouse. Jerline Pain, MD 11/13/2018 8:06 AM

## 2018-11-14 ENCOUNTER — Ambulatory Visit (INDEPENDENT_AMBULATORY_CARE_PROVIDER_SITE_OTHER): Payer: BC Managed Care – PPO

## 2018-11-14 ENCOUNTER — Other Ambulatory Visit: Payer: Self-pay

## 2018-11-14 DIAGNOSIS — E538 Deficiency of other specified B group vitamins: Secondary | ICD-10-CM | POA: Diagnosis not present

## 2018-11-14 MED ORDER — CYANOCOBALAMIN 1000 MCG/ML IJ SOLN
1000.0000 ug | Freq: Once | INTRAMUSCULAR | Status: AC
  Administered 2018-11-14: 1000 ug via INTRAMUSCULAR

## 2018-11-14 NOTE — Progress Notes (Signed)
Per orders of Dr. Juleen China , injection of Vitamin B12 given by Loralyn Freshwater.Given in right deltoid.Patient tolerated injection well.

## 2018-11-14 NOTE — Telephone Encounter (Signed)
This encounter was created in error - please disregard.

## 2018-11-20 ENCOUNTER — Other Ambulatory Visit: Payer: Self-pay

## 2018-11-20 ENCOUNTER — Ambulatory Visit (INDEPENDENT_AMBULATORY_CARE_PROVIDER_SITE_OTHER): Payer: BC Managed Care – PPO

## 2018-11-20 DIAGNOSIS — E538 Deficiency of other specified B group vitamins: Secondary | ICD-10-CM | POA: Diagnosis not present

## 2018-11-20 MED ORDER — CYANOCOBALAMIN 1000 MCG/ML IJ SOLN
1000.0000 ug | Freq: Once | INTRAMUSCULAR | Status: AC
  Administered 2018-11-20: 1000 ug via INTRAMUSCULAR

## 2018-11-20 NOTE — Progress Notes (Signed)
Per orders of Dr.Wallace  injection of B12 given in left deltoid by Sandford Craze RN  Patient tolerated injection well.

## 2018-11-21 ENCOUNTER — Ambulatory Visit: Payer: BC Managed Care – PPO

## 2018-11-27 ENCOUNTER — Other Ambulatory Visit: Payer: Self-pay

## 2018-11-27 ENCOUNTER — Ambulatory Visit (INDEPENDENT_AMBULATORY_CARE_PROVIDER_SITE_OTHER): Payer: BC Managed Care – PPO

## 2018-11-27 DIAGNOSIS — E538 Deficiency of other specified B group vitamins: Secondary | ICD-10-CM

## 2018-11-27 MED ORDER — CYANOCOBALAMIN 1000 MCG/ML IJ SOLN
1000.0000 ug | Freq: Once | INTRAMUSCULAR | Status: AC
  Administered 2018-11-27: 1000 ug via INTRAMUSCULAR

## 2018-11-27 NOTE — Patient Instructions (Signed)
Health Maintenance Due  Topic Date Due  . MAMMOGRAM  11/08/2017  . PAP SMEAR-Modifier  10/20/2018    Depression screen [facility] 2/9 11/02/2018 08/08/2018 12/11/2017  Decreased Interest 0 0 0  Down, Depressed, Hopeless 0 0 0  PHQ - 2 Score 0 0 0  Altered sleeping - - -  Tired, decreased energy - - -  Change in appetite - - -  Feeling bad or failure about yourself  - - -  Trouble concentrating - - -  Moving slowly or fidgety/restless - - -  PHQ-9 Score - - -    

## 2018-11-28 DIAGNOSIS — M546 Pain in thoracic spine: Secondary | ICD-10-CM | POA: Diagnosis not present

## 2018-11-28 DIAGNOSIS — M62838 Other muscle spasm: Secondary | ICD-10-CM | POA: Diagnosis not present

## 2018-11-28 DIAGNOSIS — M9901 Segmental and somatic dysfunction of cervical region: Secondary | ICD-10-CM | POA: Diagnosis not present

## 2018-11-28 DIAGNOSIS — M9902 Segmental and somatic dysfunction of thoracic region: Secondary | ICD-10-CM | POA: Diagnosis not present

## 2018-12-05 ENCOUNTER — Ambulatory Visit
Admission: RE | Admit: 2018-12-05 | Discharge: 2018-12-05 | Disposition: A | Discharge status: Home / Self Care | Payer: BC Managed Care – PPO | Source: Ambulatory Visit | Attending: Family Medicine | Admitting: Family Medicine

## 2018-12-05 ENCOUNTER — Other Ambulatory Visit: Payer: Self-pay

## 2018-12-05 DIAGNOSIS — Z1231 Encounter for screening mammogram for malignant neoplasm of breast: Secondary | ICD-10-CM

## 2018-12-05 NOTE — Progress Notes (Addendum)
Per orders of Dr. Juleen China, injection of B12 injection 105mcg given by Mousa Prout L Alecsander Hattabaugh in right deltoid. Patient tolerated injection well. Patient will make appointment for 1 month.

## 2018-12-05 NOTE — Addendum Note (Signed)
Addended by: Darral Dash on: 12/05/2018 04:28 PM   Modules accepted: Level of Service

## 2018-12-06 NOTE — Progress Notes (Signed)
I have reviewed the patient's encounter and agree with the documentation.  Donna M. Parker, MD 12/06/2018 4:35 PM   

## 2019-01-11 ENCOUNTER — Telehealth: Payer: Self-pay | Admitting: Family Medicine

## 2019-01-11 ENCOUNTER — Other Ambulatory Visit: Payer: Self-pay

## 2019-01-11 MED ORDER — CIPROFLOXACIN HCL 500 MG PO TABS: 500.0000 mg | ORAL_TABLET | Freq: Two times a day (BID) | ORAL | 0 refills | Status: AC

## 2019-01-11 MED ORDER — METRONIDAZOLE 500 MG PO TABS: 500.0000 mg | ORAL_TABLET | Freq: Three times a day (TID) | ORAL | 0 refills | Status: DC

## 2019-01-11 NOTE — Telephone Encounter (Signed)
Rx sent to pharmacy and patient notified to go to UC or ED if symptoms worsen.

## 2019-01-11 NOTE — Telephone Encounter (Signed)
Pt states she is having another flare up of diverticulitis  Pt state. Dr Jerline Pain told her he would send in Rx for her if she has flair up before she goes to Psychologist, sport and exercise. Pt is having major flair up. Pt requesting  Cipro 500 mg  metroNIDAZOLE (FLAGYL) 500 MG tablet   [id number] [address], [place] - [place] [phone] (Phone) [phone] (Fax)

## 2019-01-11 NOTE — Telephone Encounter (Signed)
Ok to send in cipro 500mg  bid x 7 days and flagyl 500mg  tid for 7 days.  Needs to go to ED or urgent care if symptoms worsen or do not improve over the long weekend.   Algis Greenhouse. Jerline Pain, MD 01/11/2019 3:19 PM

## 2019-01-11 NOTE — Telephone Encounter (Signed)
Please advise 

## 2019-01-15 DIAGNOSIS — K5792 Diverticulitis of intestine, part unspecified, without perforation or abscess without bleeding: Secondary | ICD-10-CM | POA: Diagnosis not present

## 2019-01-16 ENCOUNTER — Other Ambulatory Visit: Payer: Self-pay | Admitting: Dermatology

## 2019-01-16 DIAGNOSIS — D229 Melanocytic nevi, unspecified: Secondary | ICD-10-CM | POA: Diagnosis not present

## 2019-01-16 DIAGNOSIS — D2239 Melanocytic nevi of other parts of face: Secondary | ICD-10-CM | POA: Diagnosis not present

## 2019-01-16 DIAGNOSIS — D485 Neoplasm of uncertain behavior of skin: Secondary | ICD-10-CM | POA: Diagnosis not present

## 2019-02-06 DIAGNOSIS — K5732 Diverticulitis of large intestine without perforation or abscess without bleeding: Secondary | ICD-10-CM | POA: Diagnosis not present

## 2019-02-06 DIAGNOSIS — K5792 Diverticulitis of intestine, part unspecified, without perforation or abscess without bleeding: Secondary | ICD-10-CM | POA: Diagnosis not present

## 2019-02-07 DIAGNOSIS — K5792 Diverticulitis of intestine, part unspecified, without perforation or abscess without bleeding: Secondary | ICD-10-CM | POA: Diagnosis not present

## 2019-02-07 DIAGNOSIS — I700 Atherosclerosis of aorta: Secondary | ICD-10-CM | POA: Diagnosis not present

## 2019-02-12 DIAGNOSIS — Z20828 Contact with and (suspected) exposure to other viral communicable diseases: Secondary | ICD-10-CM | POA: Diagnosis not present

## 2019-02-12 DIAGNOSIS — Z1159 Encounter for screening for other viral diseases: Secondary | ICD-10-CM | POA: Diagnosis not present

## 2019-03-06 DIAGNOSIS — K5792 Diverticulitis of intestine, part unspecified, without perforation or abscess without bleeding: Secondary | ICD-10-CM | POA: Diagnosis not present

## 2019-03-06 DIAGNOSIS — Z20828 Contact with and (suspected) exposure to other viral communicable diseases: Secondary | ICD-10-CM | POA: Diagnosis not present

## 2019-03-06 DIAGNOSIS — Z01812 Encounter for preprocedural laboratory examination: Secondary | ICD-10-CM | POA: Diagnosis not present

## 2019-03-08 DIAGNOSIS — Z01818 Encounter for other preprocedural examination: Secondary | ICD-10-CM | POA: Diagnosis not present

## 2019-03-08 DIAGNOSIS — R1011 Right upper quadrant pain: Secondary | ICD-10-CM | POA: Diagnosis not present

## 2019-03-12 DIAGNOSIS — F329 Major depressive disorder, single episode, unspecified: Secondary | ICD-10-CM | POA: Diagnosis not present

## 2019-03-12 DIAGNOSIS — Z20828 Contact with and (suspected) exposure to other viral communicable diseases: Secondary | ICD-10-CM | POA: Diagnosis not present

## 2019-03-12 DIAGNOSIS — Z881 Allergy status to other antibiotic agents status: Secondary | ICD-10-CM | POA: Diagnosis not present

## 2019-03-12 DIAGNOSIS — K5792 Diverticulitis of intestine, part unspecified, without perforation or abscess without bleeding: Secondary | ICD-10-CM | POA: Diagnosis not present

## 2019-03-12 DIAGNOSIS — K429 Umbilical hernia without obstruction or gangrene: Secondary | ICD-10-CM | POA: Diagnosis not present

## 2019-03-12 DIAGNOSIS — Z466 Encounter for fitting and adjustment of urinary device: Secondary | ICD-10-CM | POA: Diagnosis not present

## 2019-03-12 DIAGNOSIS — Z1211 Encounter for screening for malignant neoplasm of colon: Secondary | ICD-10-CM | POA: Diagnosis not present

## 2019-03-12 DIAGNOSIS — I10 Essential (primary) hypertension: Secondary | ICD-10-CM | POA: Diagnosis not present

## 2019-03-12 DIAGNOSIS — I493 Ventricular premature depolarization: Secondary | ICD-10-CM | POA: Diagnosis not present

## 2019-03-12 DIAGNOSIS — M170 Bilateral primary osteoarthritis of knee: Secondary | ICD-10-CM | POA: Diagnosis not present

## 2019-03-12 DIAGNOSIS — K5732 Diverticulitis of large intestine without perforation or abscess without bleeding: Secondary | ICD-10-CM | POA: Diagnosis not present

## 2019-03-12 DIAGNOSIS — K219 Gastro-esophageal reflux disease without esophagitis: Secondary | ICD-10-CM | POA: Diagnosis not present

## 2019-03-12 DIAGNOSIS — Z4682 Encounter for fitting and adjustment of non-vascular catheter: Secondary | ICD-10-CM | POA: Diagnosis not present

## 2019-03-12 DIAGNOSIS — Z79899 Other long term (current) drug therapy: Secondary | ICD-10-CM | POA: Diagnosis not present

## 2019-03-12 DIAGNOSIS — G8918 Other acute postprocedural pain: Secondary | ICD-10-CM | POA: Diagnosis not present

## 2019-03-12 DIAGNOSIS — K589 Irritable bowel syndrome without diarrhea: Secondary | ICD-10-CM | POA: Diagnosis not present

## 2019-03-12 DIAGNOSIS — Z880 Allergy status to penicillin: Secondary | ICD-10-CM | POA: Diagnosis not present

## 2019-03-12 DIAGNOSIS — K648 Other hemorrhoids: Secondary | ICD-10-CM | POA: Diagnosis not present

## 2019-03-12 DIAGNOSIS — Z823 Family history of stroke: Secondary | ICD-10-CM | POA: Diagnosis not present

## 2019-03-12 DIAGNOSIS — Z8249 Family history of ischemic heart disease and other diseases of the circulatory system: Secondary | ICD-10-CM | POA: Diagnosis not present

## 2019-03-12 DIAGNOSIS — K5730 Diverticulosis of large intestine without perforation or abscess without bleeding: Secondary | ICD-10-CM | POA: Diagnosis not present

## 2019-03-12 DIAGNOSIS — Z882 Allergy status to sulfonamides status: Secondary | ICD-10-CM | POA: Diagnosis not present

## 2019-03-12 DIAGNOSIS — Z885 Allergy status to narcotic agent status: Secondary | ICD-10-CM | POA: Diagnosis not present

## 2019-03-12 DIAGNOSIS — F40240 Claustrophobia: Secondary | ICD-10-CM | POA: Diagnosis not present

## 2019-03-17 MED ORDER — PANTOPRAZOLE SODIUM 40 MG PO TBEC
40.00 | DELAYED_RELEASE_TABLET | ORAL | Status: DC
Start: 2019-03-18 — End: 2019-03-17

## 2019-03-17 MED ORDER — K-PHOS-NEUTRAL 155-852-130 MG PO TABS
250.00 | ORAL_TABLET | ORAL | Status: DC
Start: 2019-03-17 — End: 2019-03-17

## 2019-03-17 MED ORDER — SIMETHICONE 80 MG PO CHEW
80.00 | CHEWABLE_TABLET | ORAL | Status: DC
Start: 2019-03-17 — End: 2019-03-17

## 2019-03-17 MED ORDER — ENOXAPARIN SODIUM 40 MG/0.4ML ~~LOC~~ SOLN
40.00 | SUBCUTANEOUS | Status: DC
Start: 2019-03-17 — End: 2019-03-17

## 2019-03-17 MED ORDER — ONDANSETRON HCL 4 MG/2ML IJ SOLN
4.00 | INTRAMUSCULAR | Status: DC
Start: ? — End: 2019-03-17

## 2019-03-17 MED ORDER — GENERIC EXTERNAL MEDICATION
12.50 | Status: DC
Start: ? — End: 2019-03-17

## 2019-03-17 MED ORDER — MAGNESIUM OXIDE 400 MG PO TABS
400.00 | ORAL_TABLET | ORAL | Status: DC
Start: 2019-03-17 — End: 2019-03-17

## 2019-03-17 MED ORDER — KETOROLAC TROMETHAMINE 30 MG/ML IJ SOLN
30.00 | INTRAMUSCULAR | Status: DC
Start: ? — End: 2019-03-17

## 2019-03-17 MED ORDER — HYDROMORPHONE HCL 1 MG/ML IJ SOLN
1.00 | INTRAMUSCULAR | Status: DC
Start: ? — End: 2019-03-17

## 2019-03-17 MED ORDER — BENZOCAINE-MENTHOL 6-10 MG MT LOZG
1.00 | LOZENGE | OROMUCOSAL | Status: DC
Start: ? — End: 2019-03-17

## 2019-07-15 ENCOUNTER — Other Ambulatory Visit: Payer: Self-pay | Admitting: Family Medicine

## 2019-07-15 DIAGNOSIS — E559 Vitamin D deficiency, unspecified: Secondary | ICD-10-CM

## 2019-07-29 DIAGNOSIS — Z01419 Encounter for gynecological examination (general) (routine) without abnormal findings: Secondary | ICD-10-CM | POA: Diagnosis not present

## 2019-08-20 DIAGNOSIS — K432 Incisional hernia without obstruction or gangrene: Secondary | ICD-10-CM | POA: Diagnosis not present

## 2019-08-30 DIAGNOSIS — Z20822 Contact with and (suspected) exposure to covid-19: Secondary | ICD-10-CM | POA: Diagnosis not present

## 2019-08-30 DIAGNOSIS — K432 Incisional hernia without obstruction or gangrene: Secondary | ICD-10-CM | POA: Diagnosis not present

## 2019-08-30 DIAGNOSIS — Z01812 Encounter for preprocedural laboratory examination: Secondary | ICD-10-CM | POA: Diagnosis not present

## 2019-09-06 DIAGNOSIS — K432 Incisional hernia without obstruction or gangrene: Secondary | ICD-10-CM | POA: Diagnosis not present

## 2019-10-24 DIAGNOSIS — Z8719 Personal history of other diseases of the digestive system: Secondary | ICD-10-CM | POA: Diagnosis not present

## 2019-10-24 DIAGNOSIS — R1900 Intra-abdominal and pelvic swelling, mass and lump, unspecified site: Secondary | ICD-10-CM | POA: Diagnosis not present

## 2019-10-24 DIAGNOSIS — R109 Unspecified abdominal pain: Secondary | ICD-10-CM | POA: Diagnosis not present

## 2019-10-24 DIAGNOSIS — Z9049 Acquired absence of other specified parts of digestive tract: Secondary | ICD-10-CM | POA: Diagnosis not present

## 2019-10-24 DIAGNOSIS — Z9889 Other specified postprocedural states: Secondary | ICD-10-CM | POA: Diagnosis not present

## 2019-10-24 DIAGNOSIS — K449 Diaphragmatic hernia without obstruction or gangrene: Secondary | ICD-10-CM | POA: Diagnosis not present

## 2019-11-04 DIAGNOSIS — N83209 Unspecified ovarian cyst, unspecified side: Secondary | ICD-10-CM | POA: Diagnosis not present

## 2019-11-06 DIAGNOSIS — F40240 Claustrophobia: Secondary | ICD-10-CM | POA: Diagnosis not present

## 2019-11-06 DIAGNOSIS — N83202 Unspecified ovarian cyst, left side: Secondary | ICD-10-CM | POA: Diagnosis not present

## 2019-11-06 DIAGNOSIS — M461 Sacroiliitis, not elsewhere classified: Secondary | ICD-10-CM | POA: Diagnosis not present

## 2019-11-06 DIAGNOSIS — K432 Incisional hernia without obstruction or gangrene: Secondary | ICD-10-CM | POA: Diagnosis not present

## 2019-11-06 DIAGNOSIS — I498 Other specified cardiac arrhythmias: Secondary | ICD-10-CM | POA: Diagnosis not present

## 2019-11-06 DIAGNOSIS — M170 Bilateral primary osteoarthritis of knee: Secondary | ICD-10-CM | POA: Diagnosis not present

## 2019-11-06 DIAGNOSIS — K219 Gastro-esophageal reflux disease without esophagitis: Secondary | ICD-10-CM | POA: Diagnosis not present

## 2019-11-06 DIAGNOSIS — Z469 Encounter for fitting and adjustment of unspecified device: Secondary | ICD-10-CM | POA: Diagnosis not present

## 2019-11-06 DIAGNOSIS — K660 Peritoneal adhesions (postprocedural) (postinfection): Secondary | ICD-10-CM | POA: Diagnosis not present

## 2019-11-06 DIAGNOSIS — K589 Irritable bowel syndrome without diarrhea: Secondary | ICD-10-CM | POA: Diagnosis not present

## 2019-11-06 DIAGNOSIS — K430 Incisional hernia with obstruction, without gangrene: Secondary | ICD-10-CM | POA: Diagnosis not present

## 2019-11-06 DIAGNOSIS — F329 Major depressive disorder, single episode, unspecified: Secondary | ICD-10-CM | POA: Diagnosis not present

## 2019-11-06 DIAGNOSIS — Y838 Other surgical procedures as the cause of abnormal reaction of the patient, or of later complication, without mention of misadventure at the time of the procedure: Secondary | ICD-10-CM | POA: Diagnosis not present

## 2019-12-24 DIAGNOSIS — M62838 Other muscle spasm: Secondary | ICD-10-CM | POA: Diagnosis not present

## 2019-12-24 DIAGNOSIS — Z9889 Other specified postprocedural states: Secondary | ICD-10-CM | POA: Diagnosis not present

## 2019-12-24 DIAGNOSIS — R238 Other skin changes: Secondary | ICD-10-CM | POA: Diagnosis not present

## 2019-12-24 DIAGNOSIS — M6281 Muscle weakness (generalized): Secondary | ICD-10-CM | POA: Diagnosis not present

## 2019-12-24 DIAGNOSIS — Z8719 Personal history of other diseases of the digestive system: Secondary | ICD-10-CM | POA: Diagnosis not present

## 2019-12-30 DIAGNOSIS — R238 Other skin changes: Secondary | ICD-10-CM | POA: Diagnosis not present

## 2019-12-30 DIAGNOSIS — M6281 Muscle weakness (generalized): Secondary | ICD-10-CM | POA: Diagnosis not present

## 2019-12-30 DIAGNOSIS — Z9889 Other specified postprocedural states: Secondary | ICD-10-CM | POA: Diagnosis not present

## 2019-12-30 DIAGNOSIS — M62838 Other muscle spasm: Secondary | ICD-10-CM | POA: Diagnosis not present

## 2019-12-30 DIAGNOSIS — Z8719 Personal history of other diseases of the digestive system: Secondary | ICD-10-CM | POA: Diagnosis not present

## 2020-03-10 DIAGNOSIS — Z03818 Encounter for observation for suspected exposure to other biological agents ruled out: Secondary | ICD-10-CM | POA: Diagnosis not present

## 2020-03-10 DIAGNOSIS — Z20822 Contact with and (suspected) exposure to covid-19: Secondary | ICD-10-CM | POA: Diagnosis not present

## 2020-03-11 DIAGNOSIS — Z20822 Contact with and (suspected) exposure to covid-19: Secondary | ICD-10-CM | POA: Diagnosis not present

## 2020-04-17 ENCOUNTER — Other Ambulatory Visit: Payer: Self-pay

## 2020-04-17 ENCOUNTER — Encounter: Payer: Self-pay | Admitting: Family Medicine

## 2020-04-17 ENCOUNTER — Ambulatory Visit (INDEPENDENT_AMBULATORY_CARE_PROVIDER_SITE_OTHER): Payer: BC Managed Care – PPO | Admitting: Family Medicine

## 2020-04-17 VITALS — BP 155/72 | HR 60 | Temp 98.2°F | Ht 63.5 in | Wt 194.8 lb

## 2020-04-17 DIAGNOSIS — Z0001 Encounter for general adult medical examination with abnormal findings: Secondary | ICD-10-CM

## 2020-04-17 DIAGNOSIS — E785 Hyperlipidemia, unspecified: Secondary | ICD-10-CM

## 2020-04-17 DIAGNOSIS — R739 Hyperglycemia, unspecified: Secondary | ICD-10-CM | POA: Diagnosis not present

## 2020-04-17 DIAGNOSIS — F419 Anxiety disorder, unspecified: Secondary | ICD-10-CM

## 2020-04-17 DIAGNOSIS — E669 Obesity, unspecified: Secondary | ICD-10-CM

## 2020-04-17 DIAGNOSIS — E559 Vitamin D deficiency, unspecified: Secondary | ICD-10-CM | POA: Diagnosis not present

## 2020-04-17 DIAGNOSIS — Z6833 Body mass index (BMI) 33.0-33.9, adult: Secondary | ICD-10-CM

## 2020-04-17 MED ORDER — CITALOPRAM HYDROBROMIDE 10 MG PO TABS: 10.0000 mg | ORAL_TABLET | Freq: Every day | ORAL | 5 refills | Status: DC

## 2020-04-17 MED ORDER — ALPRAZOLAM 0.25 MG PO TABS: 0.1250 mg | ORAL_TABLET | Freq: Two times a day (BID) | ORAL | 0 refills | Status: DC | PRN

## 2020-04-17 NOTE — Assessment & Plan Note (Signed)
Check lipid panel.  Discussed lifestyle modifications. °

## 2020-04-17 NOTE — Assessment & Plan Note (Signed)
Check A1c.  Discussed lifestyle modifications. °

## 2020-04-17 NOTE — Progress Notes (Signed)
Chief Complaint:  Donna [name] is a 54 y.o. female who presents today for her annual comprehensive physical exam.    Assessment/Plan:  Chronic Problems Addressed Today: Hyperglycemia Check A1c.  Discussed lifestyle modifications.  Dyslipidemia Check lipid panel.  Discussed lifestyle modifications.  Anxiety Much worsened since recent surgeries.  We will start low-dose Celexa 10 mg daily.  Also send in small supply of Xanax.  She will check with me in a couple weeks via MyChart.   Body mass index is 33.97 kg/m. / Obese  BMI Metric Follow Up - 04/17/20 1419      BMI Metric Follow Up-Please document annually   BMI Metric Follow Up Education provided            Preventative Healthcare: Check CBC, CMET, TSH, lipid panel.  Patient Counseling(The following topics were reviewed and/or handout was given):  -Nutrition: Stressed importance of moderation in sodium/caffeine intake, saturated fat and cholesterol, caloric balance, sufficient intake of fresh fruits, vegetables, and fiber.  -Stressed the importance of regular exercise.   -Substance Abuse: Discussed cessation/primary prevention of tobacco, alcohol, or other drug use; driving or other dangerous activities under the influence; availability of treatment for abuse.   -Injury prevention: Discussed safety belts, safety helmets, smoke detector, smoking near bedding or upholstery.   -Sexuality: Discussed sexually transmitted diseases, partner selection, use of condoms, avoidance of unintended pregnancy and contraceptive alternatives.   -Dental health: Discussed importance of regular tooth brushing, flossing, and dental visits.  -Health maintenance and immunizations reviewed. Please refer to Health maintenance section.  Return to care in 1 year for next preventative visit.     Subjective:  HPI:  She has no acute complaints today.   She has last seen here about a year ago.  She has underwent 3 surgeries since then.  1 for  diverticulitis and 2 for hernia repair.  She has had much more anxiety over the last several weeks related to this.  No depression.  She has had anxiety in the past and used Xanax which is worked well.  She has also been on other medications in the past including Effexor and lamictal which she did not like as much.  Lifestyle Diet: Balanced.  Exercise: Walks dog 3-4 times per day.   Depression screen [facility] 2/9 11/02/2018  Decreased Interest 0  Down, Depressed, Hopeless 0  PHQ - 2 Score 0  Altered sleeping -  Tired, decreased energy -  Change in appetite -  Feeling bad or failure about yourself  -  Trouble concentrating -  Moving slowly or fidgety/restless -  PHQ-9 Score -    Health Maintenance Due  Topic Date Due  . Hepatitis C Screening  Never done  . TETANUS/TDAP  Never done  . COVID-19 Vaccine (2 - Booster for Janssen series) 10/10/2019     ROS: Per HPI, otherwise a complete review of systems was negative.   PMH:  The following were reviewed and entered/updated in epic: Past Medical History:  Diagnosis Date  . Allergy   . Anxiety   . Constipation   . Depression   . Diverticulitis   . Elevated BP without diagnosis of hypertension   . GERD (gastroesophageal reflux disease)   . HTN (hypertension)   . IBS (irritable bowel syndrome)   . Night muscle spasms    back  . Osteoarthritis (arthritis due to wear and tear of joints)    knees  . Ovarian cyst   . Pain in the abdomen  lower left quarter  . Palpitations   . SOB (shortness of breath) on exertion   . Swelling    feet and legs   Patient Active Problem List   Diagnosis Date Noted  . Dyslipidemia 11/05/2018  . Hyperglycemia 11/05/2018  . Gastroesophageal reflux disease 08/08/2018  . Anxiety 08/08/2018  . Vitamin D deficiency 10/04/2016   Past Surgical History:  Procedure Laterality Date  . ABDOMINAL ADHESION SURGERY    . acl replacement  2000  . ovarian cysts      Family History  Problem Relation Age  of Onset  . Heart disease Father   . Hypertension Father   . Stroke Father   . Diabetes Father   . Hyperlipidemia Father   . Kidney disease Father   . Sleep apnea Father   . Obesity Father   . Breast cancer Maternal Aunt   . Breast cancer Maternal Grandmother   . Anxiety disorder Mother     Medications- reviewed and updated Current Outpatient Medications  Medication Sig Dispense Refill  . levonorgestrel (MIRENA) 20 MCG/24HR IUD 1 each by Intrauterine route once.    . ALPRAZolam (XANAX) 0.25 MG tablet Take 0.5 tablets (0.125 mg total) by mouth 2 (two) times daily as needed for anxiety. 20 tablet 0  . citalopram (CELEXA) 10 MG tablet Take 1 tablet (10 mg total) by mouth daily. 30 tablet 5  . ibuprofen (ADVIL,MOTRIN) 200 MG tablet Take 200 mg by mouth every 6 (six) hours as needed for moderate pain. (Patient not taking: Reported on 04/17/2020)    . omeprazole (PRILOSEC) 10 MG capsule Take 10 mg by mouth daily. (Patient not taking: Reported on 04/17/2020)    . OVER THE COUNTER MEDICATION Take 4 Doses by mouth daily as needed (for stomach). OTC Fiber Well gummies (Patient not taking: Reported on 04/17/2020)    . Vitamin D, Ergocalciferol, (DRISDOL) 1.25 MG (50000 UT) CAPS capsule Take 1 capsule (50,000 Units total) by mouth every 7 (seven) days. (Patient not taking: Reported on 04/17/2020) 12 capsule 3   No current facility-administered medications for this visit.    Allergies-reviewed and updated Allergies  Allergen Reactions  . Epinephrine Other (See Comments)    Shakes like shes having a seizure  . Augmentin [Amoxicillin-Pot Clavulanate] Other (See Comments)    States it burns her inside   . Codeine Hives and Other (See Comments)    Upset stomach  . Cortisone Swelling  . Levaquin [Levofloxacin In D5w] Other (See Comments)    Blisters on hands  . Omnipred [Prednisolone Acetate] Other (See Comments)    Migraine, emesis  . Sulfa Antibiotics Nausea And Vomiting  . Sulfasalazine  Nausea And Vomiting  . Tequin [Gatifloxacin] Other (See Comments)    Blisters on hands  . Cefdinir Nausea And Vomiting    Reports blisters on hands and feet and migraines.    Social History   Socioeconomic History  . Marital status: Married    Spouse name: Not on file  . Number of children: 2  . Years of education: Not on file  . Highest education level: Not on file  Occupational History  . Occupation: cosmetologist  Tobacco Use  . Smoking status: Never Smoker  . Smokeless tobacco: Never Used  Substance and Sexual Activity  . Alcohol use: Yes    Comment: occassionally  . Drug use: No  . Sexual activity: Yes    Birth control/protection: I.U.D.  Other Topics Concern  . Not on file  Social History Narrative  .  Not on file   Social Determinants of Health   Financial Resource Strain: Not on file  Food Insecurity: Not on file  Transportation Needs: Not on file  Physical Activity: Not on file  Stress: Not on file  Social Connections: Not on file        Objective:  Physical Exam: BP (!) 155/72   Pulse 60   Temp 98.2 F (36.8 C) (Temporal)   Ht 5' 3.5" (1.613 m)   Wt 194 lb 12.8 oz (88.4 kg)   SpO2 100%   BMI 33.97 kg/m   Body mass index is 33.97 kg/m. Wt Readings from Last 3 Encounters:  04/17/20 194 lb 12.8 oz (88.4 kg)  11/02/18 179 lb 4 oz (81.3 kg)  08/08/18 175 lb 9.6 oz (79.7 kg)   Gen: NAD, resting comfortably HEENT: TMs normal bilaterally. OP clear. No thyromegaly noted.  CV: RRR with no murmurs appreciated Pulm: NWOB, CTAB with no crackles, wheezes, or rhonchi GI: Normal bowel sounds present. Soft, Nontender, Nondistended. MSK: no edema, cyanosis, or clubbing noted Skin: warm, dry Neuro: CN2-12 grossly intact. Strength 5/5 in upper and lower extremities. Reflexes symmetric and intact bilaterally.  Psych: Normal affect and thought content     Keina Mutch M. Jimmey Ralph, MD 04/17/2020 2:19 PM

## 2020-04-17 NOTE — Patient Instructions (Addendum)
It was very nice to see you today!  Please start the celexa. I will also send in a small supply of xanax.  We will check blood work today.  Please check with me in a couple weeks on MyChart to let me know how the Celexa is working for you.  Take care, Dr Jerline Pain  Please try these tips to maintain a healthy lifestyle:   Eat at least 3 REAL meals and 1-2 snacks per day.  Aim for no more than 5 hours between eating.  If you eat breakfast, please do so within one hour of getting up.    Each meal should contain half fruits/vegetables, one quarter protein, and one quarter carbs (no bigger than a computer mouse)   Cut down on sweet beverages. This includes juice, soda, and sweet tea.     Drink at least 1 glass of water with each meal and aim for at least 8 glasses per day   Exercise at least 150 minutes every week.    Preventive Care 65-23 Years Old, Female Preventive care refers to visits with your health care provider and lifestyle choices that can promote health and wellness. This includes:  A yearly physical exam. This may also be called an annual well check.  Regular dental visits and eye exams.  Immunizations.  Screening for certain conditions.  Healthy lifestyle choices, such as eating a healthy diet, getting regular exercise, not using drugs or products that contain nicotine and tobacco, and limiting alcohol use. What can I expect for my preventive care visit? Physical exam Your health care provider will check your:  Height and weight. This may be used to calculate body mass index (BMI), which tells if you are at a healthy weight.  Heart rate and blood pressure.  Skin for abnormal spots. Counseling Your health care provider may ask you questions about your:  Alcohol, tobacco, and drug use.  Emotional well-being.  Home and relationship well-being.  Sexual activity.  Eating habits.  Work and work Statistician.  Method of birth control.  Menstrual  cycle.  Pregnancy history. What immunizations do I need?  Influenza (flu) vaccine  This is recommended every year. Tetanus, diphtheria, and pertussis (Tdap) vaccine  You may need a Td booster every 10 years. Varicella (chickenpox) vaccine  You may need this if you have not been vaccinated. Zoster (shingles) vaccine  You may need this after age 10. Measles, mumps, and rubella (MMR) vaccine  You may need at least one dose of MMR if you were born in 1957 or later. You may also need a second dose. Pneumococcal conjugate (PCV13) vaccine  You may need this if you have certain conditions and were not previously vaccinated. Pneumococcal polysaccharide (PPSV23) vaccine  You may need one or two doses if you smoke cigarettes or if you have certain conditions. Meningococcal conjugate (MenACWY) vaccine  You may need this if you have certain conditions. Hepatitis A vaccine  You may need this if you have certain conditions or if you travel or work in places where you may be exposed to hepatitis A. Hepatitis B vaccine  You may need this if you have certain conditions or if you travel or work in places where you may be exposed to hepatitis B. Haemophilus influenzae type b (Hib) vaccine  You may need this if you have certain conditions. Human papillomavirus (HPV) vaccine  If recommended by your health care provider, you may need three doses over 6 months. You may receive vaccines as individual doses  or as more than one vaccine together in one shot (combination vaccines). Talk with your health care provider about the risks and benefits of combination vaccines. What tests do I need? Blood tests  Lipid and cholesterol levels. These may be checked every 5 years, or more frequently if you are over 45 years old.  Hepatitis C test.  Hepatitis B test. Screening  Lung cancer screening. You may have this screening every year starting at age 46 if you have a 30-pack-year history of smoking and  currently smoke or have quit within the past 15 years.  Colorectal cancer screening. All adults should have this screening starting at age 22 and continuing until age 71. Your health care provider may recommend screening at age 57 if you are at increased risk. You will have tests every 1-10 years, depending on your results and the type of screening test.  Diabetes screening. This is done by checking your blood sugar (glucose) after you have not eaten for a while (fasting). You may have this done every 1-3 years.  Mammogram. This may be done every 1-2 years. Talk with your health care provider about when you should start having regular mammograms. This may depend on whether you have a family history of breast cancer.  BRCA-related cancer screening. This may be done if you have a family history of breast, ovarian, tubal, or peritoneal cancers.  Pelvic exam and Pap test. This may be done every 3 years starting at age 80. Starting at age 28, this may be done every 5 years if you have a Pap test in combination with an HPV test. Other tests  Sexually transmitted disease (STD) testing.  Bone density scan. This is done to screen for osteoporosis. You may have this scan if you are at high risk for osteoporosis. Follow these instructions at home: Eating and drinking  Eat a diet that includes fresh fruits and vegetables, whole grains, lean protein, and low-fat dairy.  Take vitamin and mineral supplements as recommended by your health care provider.  Do not drink alcohol if: ? Your health care provider tells you not to drink. ? You are pregnant, may be pregnant, or are planning to become pregnant.  If you drink alcohol: ? Limit how much you have to 0-1 drink a day. ? Be aware of how much alcohol is in your drink. In the [place] one drink equals one 12 oz bottle of beer (355 mL), one 5 oz glass of wine (148 mL), or one 1 oz glass of hard liquor (44 mL). Lifestyle  Take daily care of your teeth and  gums.  Stay active. Exercise for at least 30 minutes on 5 or more days each week.  Do not use any products that contain nicotine or tobacco, such as cigarettes, e-cigarettes, and chewing tobacco. If you need help quitting, ask your health care provider.  If you are sexually active, practice safe sex. Use a condom or other form of birth control (contraception) in order to prevent pregnancy and STIs (sexually transmitted infections).  If told by your health care provider, take low-dose aspirin daily starting at age 1. What's next?  Visit your health care provider once a year for a well check visit.  Ask your health care provider how often you should have your eyes and teeth checked.  Stay up to date on all vaccines. This information is not intended to replace advice given to you by your health care provider. Make sure you discuss any questions you have with  your health care provider. Document Revised: 01/04/2018 Document Reviewed: 01/04/2018 Elsevier Patient Education  2020 [employer].

## 2020-04-17 NOTE — Assessment & Plan Note (Addendum)
Much worsened since recent surgeries.  We will start low-dose Celexa 10 mg daily.  Also send in small supply of Xanax.  She will check with me in a couple weeks via MyChart.  No red flags.

## 2020-04-18 LAB — LIPID PANEL
Cholesterol: 200 mg/dL — ABNORMAL HIGH (ref <200)
HDL: 43 mg/dL — ABNORMAL LOW (ref >=50)
LDL Cholesterol (Calc): 132 mg/dL (calc) — ABNORMAL HIGH
Non-HDL Cholesterol (Calc): 157 mg/dL (calc) — ABNORMAL HIGH (ref <130)
Total CHOL/HDL Ratio: 4.7 (calc) (ref <5.0)
Triglycerides: 142 mg/dL (ref <150)

## 2020-04-18 LAB — COMPREHENSIVE METABOLIC PANEL
AG Ratio: 1.3 (calc) (ref 1.0–2.5)
ALT: 22 U/L (ref 6–29)
AST: 20 U/L (ref 10–35)
Albumin: 4.3 g/dL (ref 3.6–5.1)
Alkaline phosphatase (APISO): 72 U/L (ref 37–153)
BUN: 13 mg/dL (ref 7–25)
CO2: 26 mmol/L (ref 20–32)
Calcium: 9.6 mg/dL (ref 8.6–10.4)
Chloride: 102 mmol/L (ref 98–110)
Creat: 0.83 mg/dL (ref 0.50–1.05)
Globulin: 3.2 g/dL (calc) (ref 1.9–3.7)
Glucose, Bld: 82 mg/dL (ref 65–99)
Potassium: 4.4 mmol/L (ref 3.5–5.3)
Sodium: 138 mmol/L (ref 135–146)
Total Bilirubin: 0.5 mg/dL (ref 0.2–1.2)
Total Protein: 7.5 g/dL (ref 6.1–8.1)

## 2020-04-18 LAB — CBC
HCT: 42.4 % (ref 35.0–45.0)
Hemoglobin: 14.6 g/dL (ref 11.7–15.5)
MCH: 30.1 pg (ref 27.0–33.0)
MCHC: 34.4 g/dL (ref 32.0–36.0)
MCV: 87.4 fL (ref 80.0–100.0)
MPV: 10 fL (ref 7.5–12.5)
Platelets: 335 10*3/uL (ref 140–400)
RBC: 4.85 10*6/uL (ref 3.80–5.10)
RDW: 13.1 % (ref 11.0–15.0)
WBC: 8.2 10*3/uL (ref 3.8–10.8)

## 2020-04-18 LAB — TSH: TSH: 2.73 mIU/L

## 2020-04-18 LAB — HEMOGLOBIN A1C
Hgb A1c MFr Bld: 5.9 % of total Hgb — ABNORMAL HIGH (ref <5.7)
Mean Plasma Glucose: 123 mg/dL
eAG (mmol/L): 6.8 mmol/L

## 2020-04-21 NOTE — Progress Notes (Signed)
Please inform patient of the following:  Cholesterol and blood sugar borderline but stable. Everything else is NORMAl. Do not need to make any changes to her treatment plan at this time. Would like for her to continue working on diet and exercise and we can recheck in a year or so.  Donna [name]. Jimmey Ralph, MD 04/21/2020 9:25 AM

## 2020-05-04 DIAGNOSIS — L820 Inflamed seborrheic keratosis: Secondary | ICD-10-CM | POA: Diagnosis not present

## 2020-05-04 DIAGNOSIS — L918 Other hypertrophic disorders of the skin: Secondary | ICD-10-CM | POA: Diagnosis not present

## 2020-05-04 DIAGNOSIS — D485 Neoplasm of uncertain behavior of skin: Secondary | ICD-10-CM | POA: Diagnosis not present

## 2020-05-09 ENCOUNTER — Other Ambulatory Visit: Payer: Self-pay | Admitting: Family Medicine

## 2020-05-20 DIAGNOSIS — M5442 Lumbago with sciatica, left side: Secondary | ICD-10-CM | POA: Diagnosis not present

## 2020-05-20 DIAGNOSIS — M25552 Pain in left hip: Secondary | ICD-10-CM | POA: Diagnosis not present

## 2020-05-20 DIAGNOSIS — M9903 Segmental and somatic dysfunction of lumbar region: Secondary | ICD-10-CM | POA: Diagnosis not present

## 2020-05-20 DIAGNOSIS — M9905 Segmental and somatic dysfunction of pelvic region: Secondary | ICD-10-CM | POA: Diagnosis not present

## 2020-05-22 DIAGNOSIS — M9905 Segmental and somatic dysfunction of pelvic region: Secondary | ICD-10-CM | POA: Diagnosis not present

## 2020-05-22 DIAGNOSIS — M9903 Segmental and somatic dysfunction of lumbar region: Secondary | ICD-10-CM | POA: Diagnosis not present

## 2020-05-22 DIAGNOSIS — M5442 Lumbago with sciatica, left side: Secondary | ICD-10-CM | POA: Diagnosis not present

## 2020-05-22 DIAGNOSIS — M25552 Pain in left hip: Secondary | ICD-10-CM | POA: Diagnosis not present

## 2020-05-27 DIAGNOSIS — M9905 Segmental and somatic dysfunction of pelvic region: Secondary | ICD-10-CM | POA: Diagnosis not present

## 2020-05-27 DIAGNOSIS — M25552 Pain in left hip: Secondary | ICD-10-CM | POA: Diagnosis not present

## 2020-05-27 DIAGNOSIS — M9903 Segmental and somatic dysfunction of lumbar region: Secondary | ICD-10-CM | POA: Diagnosis not present

## 2020-05-27 DIAGNOSIS — M5442 Lumbago with sciatica, left side: Secondary | ICD-10-CM | POA: Diagnosis not present

## 2020-06-08 ENCOUNTER — Telehealth: Payer: Self-pay

## 2020-06-08 DIAGNOSIS — E559 Vitamin D deficiency, unspecified: Secondary | ICD-10-CM

## 2020-06-08 NOTE — Telephone Encounter (Signed)
Pt is requesting a call. She just has a few questions about when to return to work   .Marland Kitchen LAST APPOINTMENT DATE: 05/09/2020   NEXT APPOINTMENT DATE:@Visit  date not found  MEDICATION:Vitamin D, Ergocalciferol, (DRISDOL) 1.25 MG (50000 UT) CAPS capsule    PHARMACY:[id number] - [place], [place] - [address]  Okay for refill?  Please advise

## 2020-06-09 ENCOUNTER — Telehealth (INDEPENDENT_AMBULATORY_CARE_PROVIDER_SITE_OTHER): Payer: BC Managed Care – PPO | Admitting: Family Medicine

## 2020-06-09 DIAGNOSIS — U071 COVID-19: Secondary | ICD-10-CM

## 2020-06-09 MED ORDER — VITAMIN D (ERGOCALCIFEROL) 1.25 MG (50000 UNIT) PO CAPS: 50000.0000 [IU] | ORAL_CAPSULE | ORAL | 3 refills | Status: DC

## 2020-06-09 NOTE — Telephone Encounter (Signed)
Rx has been filled 

## 2020-06-09 NOTE — Progress Notes (Signed)
Virtual Visit via Video Note  I connected with Donna [name]  on 06/09/20 at  4:20 PM EST by a video enabled telemedicine application and verified that I am speaking with the correct person using two identifiers.  Location patient: home, [place] Location provider:work or home office Persons participating in the virtual visit: patient, provider  I discussed the limitations of evaluation and management by telemedicine and the availability of in person appointments. The patient expressed understanding and agreed to proceed.   HPI:  Acute telemedicine visit for COVID19: -Onset: 06/01/20, tested positive for covid on day 2 -Symptoms include: chills, nasal congestion, sinus congestion, headache, some diarrhea initially, loss of taste and smell, body aches initially - now resolved -Denies: SB, CP, vomiting -Has tried: day quil, nyquil -Pertinent past medical history: denies any -Pertinent medication allergies: epinephrine, Augmentin, codeine, cortisone, levaquin, omnipred, sulfa, sulfa, tequin, cefdinir -COVID-19 vaccine status: had J and J vaccine, has not had booster  ROS: See pertinent positives and negatives per HPI.  Past Medical History:  Diagnosis Date  . Allergy   . Anxiety   . Constipation   . Depression   . Diverticulitis   . Elevated BP without diagnosis of hypertension   . GERD (gastroesophageal reflux disease)   . HTN (hypertension)   . IBS (irritable bowel syndrome)   . Night muscle spasms    back  . Osteoarthritis (arthritis due to wear and tear of joints)    knees  . Ovarian cyst   . Pain in the abdomen    lower left quarter  . Palpitations   . SOB (shortness of breath) on exertion   . Swelling    feet and legs    Past Surgical History:  Procedure Laterality Date  . ABDOMINAL ADHESION SURGERY    . acl replacement  2000  . ovarian cysts       Current Outpatient Medications:  .  ALPRAZolam (XANAX) 0.25 MG tablet, Take 0.5 tablets (0.125 mg total) by mouth 2 (two)  times daily as needed for anxiety., Disp: 20 tablet, Rfl: 0 .  citalopram (CELEXA) 10 MG tablet, TAKE 1 TABLET BY MOUTH EVERY DAY, Disp: 90 tablet, Rfl: 2 .  ibuprofen (ADVIL,MOTRIN) 200 MG tablet, Take 200 mg by mouth every 6 (six) hours as needed for moderate pain. (Patient not taking: Reported on 04/17/2020), Disp: , Rfl:  .  levonorgestrel (MIRENA) 20 MCG/24HR IUD, 1 each by Intrauterine route once., Disp: , Rfl:  .  omeprazole (PRILOSEC) 10 MG capsule, Take 10 mg by mouth daily. (Patient not taking: Reported on 04/17/2020), Disp: , Rfl:  .  OVER THE COUNTER MEDICATION, Take 4 Doses by mouth daily as needed (for stomach). OTC Fiber Well gummies (Patient not taking: Reported on 04/17/2020), Disp: , Rfl:  .  Vitamin D, Ergocalciferol, (DRISDOL) 1.25 MG (50000 UNIT) CAPS capsule, Take 1 capsule (50,000 Units total) by mouth every 7 (seven) days., Disp: 12 capsule, Rfl: 3  EXAM:  VITALS per patient if applicable:  GENERAL: alert, oriented, appears well and in no acute distress  HEENT: atraumatic, conjunttiva clear, no obvious abnormalities on inspection of external nose and ears  NECK: normal movements of the head and neck  LUNGS: on inspection no signs of respiratory distress, breathing rate appears normal, no obvious gross SOB, gasping or wheezing  CV: no obvious cyanosis  MS: moves all visible extremities without noticeable abnormality  PSYCH/NEURO: pleasant and cooperative, no obvious depression or anxiety, speech and thought processing grossly intact  ASSESSMENT AND PLAN:  Discussed the following assessment and plan:  COVID-19  -we discussed possible serious and likely etiologies, options for evaluation and workup, limitations of telemedicine visit vs in person visit, treatment, treatment risks and precautions. Pt prefers to treat via telemedicine empirically rather than in person at this moment.   Discussed treatment options, ideal treatment window, potential complications,  isolation and precautions for COVID-19.  Other symptomatic care measures summarized in patient instructions. Work/School slipped offered: provided in patient instructions   Scheduled follow up with PCP offered: Agrees to follow-up with PCP office as needed. Advised to seek prompt in person care if worsening, new symptoms arise, or if is not improving with treatment. Discussed options for inperson care if PCP office not available. Did let this patient know that I only do telemedicine on Tuesdays and Thursdays for [place]. Advised to schedule follow up visit with PCP or [facility] if any further questions or concerns to avoid delays in care.   I discussed the assessment and treatment plan with the patient. The patient was provided an opportunity to ask questions and all were answered. The patient agreed with the plan and demonstrated an understanding of the instructions.     Terressa Koyanagi, DO

## 2020-06-09 NOTE — Patient Instructions (Addendum)
   ---------------------------------------------------------------------------------------------------------------------------      WORK SLIP:  Patient Donna [name],  05-14-1965, was seen for a medical visit today, 06/09/20 . Please excuse from work for a COVID like illness. We advise 10 days minimum from the onset of symptoms (06/01/20) PLUS 1 day of no fever and improved symptoms. Will defer to employer for a sooner return to work if symptoms have resolved, it is greater than 5 days since the positive test and the patient can wear a high-quality, tight fitting mask such as N95 or KN95 at all times for an additional 5 days. Would also suggest COVID19 antigen testing is negative prior to return.  Sincerely: E-signature: Dr. Kriste Basque, DO [facility] [place] Ph: [phone]   ------------------------------------------------------------------------------------------------------------------------------    HOME CARE TIPS:   -can use tylenol or aleve if needed for fevers, aches and pains per instructions  -can use nasal saline a few times per day if you have nasal congestion; sometimes  a short course of Afrin nasal spray for 3 days can help with symptoms as well  -stay hydrated, drink plenty of fluids and eat small healthy meals - avoid dairy  -can take 1000 IU ( ) Vit D3 and 100-500 mg of Vit C daily per instructions  -If the Covid test is positive, check out the CDC website for more information on home care, transmission and treatment for COVID19  -follow up with your doctor in 2-3 days unless improving and feeling better  -stay home while sick, except to seek medical care, and if you have COVID19 ideally it would be best to stay home for a full 10 days since the onset of symptoms PLUS one day of no fever and feeling better. Wear a good mask (such as N95 or KN95) if around others to reduce the risk of transmission.  It was nice to meet you today, and I really  hope you are feeling better soon. I help [place] out with telemedicine visits on Tuesdays and Thursdays and am available for visits on those days. If you have any concerns or questions following this visit please schedule a follow up visit with your Primary Care doctor or seek care at a local urgent care clinic to avoid delays in care.    Seek in person care or schedule a follow up video visit promptly if your symptoms worsen, new concerns arise or you are not improving with treatment. Call 911 and/or seek emergency care if your symptoms are severe or life threatening.

## 2020-06-09 NOTE — Telephone Encounter (Signed)
Patient is calling in asking if someone could give her a call as she has a bunch of questions. Did advise patient to schedule an appointment to discuss.

## 2020-08-10 ENCOUNTER — Ambulatory Visit: Payer: Self-pay

## 2020-08-10 ENCOUNTER — Other Ambulatory Visit: Payer: Self-pay | Admitting: Family Medicine

## 2020-08-10 ENCOUNTER — Ambulatory Visit (INDEPENDENT_AMBULATORY_CARE_PROVIDER_SITE_OTHER): Payer: BC Managed Care – PPO | Admitting: Orthopedic Surgery

## 2020-08-10 DIAGNOSIS — M25512 Pain in left shoulder: Secondary | ICD-10-CM

## 2020-08-10 DIAGNOSIS — Z1231 Encounter for screening mammogram for malignant neoplasm of breast: Secondary | ICD-10-CM

## 2020-08-10 DIAGNOSIS — M79671 Pain in right foot: Secondary | ICD-10-CM | POA: Diagnosis not present

## 2020-08-13 ENCOUNTER — Encounter: Payer: Self-pay | Admitting: Orthopedic Surgery

## 2020-08-13 NOTE — Progress Notes (Signed)
Office Visit Note   Patient: Donna [name]           Date of Birth: 02-28-1966           MRN: [id number] Visit Date: 08/10/2020 Requested by: Ardith Dark, MD [address] [place],  [place] [address] PCP: Ardith Dark, MD  Subjective: Chief Complaint  Patient presents with  . Left Shoulder - Pain  . 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  States that the right foot started hurting when she was on vacation at the beach in late February.  Reports primarily medial sided tarsometatarsal pain as well as pain which radiates along the course of the posterior tib tendon.  She describes some lateral sided swelling.  She has tried inserts without much relief.  Hard for her to do any type of flat shoes.  She states she has a high arch.  The more she stands the more pain she has.  Patient also reports left shoulder pain.  Has been going on for over a year.  She had 3 abdominal surgeries last year and think she may have overused the shoulder trying to get up and rollover out of bed.  Describes the pain primarily anteriorly.  No mechanical symptoms or noted decrease in range of motion.  Uses ibuprofen and muscle relaxer.  She has had cortisone injection in the past and had a severe and significant reaction to that and thus that intervention is off the table at this time.              ROS: All systems reviewed are negative as they relate to the chief complaint within the history of present illness.  Patient denies  fevers or chills.   Assessment & Plan: Visit Diagnoses:  1. Left shoulder pain, unspecified chronicity   2. Pain in right foot     Plan: Impression is right foot pain which is really somewhat murky in terms of its origin.  It is on the medial side of the foot and aligns with posterior tib tendinitis.  She is able to do a single toe heel raise with appropriate inversion of the heel.  I think stress  reaction is a consideration along with occult arthritis in the midfoot.  Further work-up would necessarily include MRI scanning which she does not really want to do yet.  Similar situation for the left shoulder.  Radiographs and exam are pretty unrevealing but I think it is possible she could have biceps tendinitis and some upper subscap partial tearing which would localize with her pain.  No real restriction yet of any type of passive range of motion but adhesive capsulitis is another consideration.  MRI scanning could help to delineate the soft tissue pathology in the shoulder if present.  She is going to hold off on both his interventions for now.  Cortisone not an option based on prior reaction.  She will call if she wants to get the scans set up.  I think wearing a reasonably arched shoe that would be important for her long-term because of her normal to slightly elevated arch on both feet.  Follow-Up Instructions: Return if symptoms worsen or fail to improve.   Orders:  Orders Placed This Encounter  Procedures  . XR Foot Complete Right  . XR Shoulder Left   No orders of the defined types were placed in this encounter.  Procedures: No procedures performed   Clinical Data: No additional findings.  Objective: Vital Signs: There were no vitals taken for this visit.  Physical Exam:   Constitutional: Patient appears well-developed HEENT:  Head: Normocephalic Eyes:EOM are normal Neck: Normal range of motion Cardiovascular: Normal rate Pulmonary/chest: Effort normal Neurologic: Patient is alert Skin: Skin is warm Psychiatric: Patient has normal mood and affect    Ortho Exam: Ortho exam demonstrates normal gait alignment.  Both feet are examined.  She has symmetric tibiotalar subtalar transverse tarsal range of motion.  Mild tenderness along the posterior tib tendon but most of her tenderness is around the base of the tarsometatarsal joint on the medial side.  No real pain with  pronation supination of the forefoot.  Palpable intact nontender anterior to posterior to peroneal and Achilles tendons.  She does have appropriate heel inversion when she stands on her toes.  No swelling is present.  Ankle is stable to anterior drawer and varus testing.  No syndesmotic instability on the right-hand side.  No lateral sided swelling is present.  Left shoulder is examined.  She has pretty symmetric range of motion passively 50/90/170 bilaterally.  Excellent rotator cuff strengthx-ray subscap muscle testing.  No discrete AC joint tenderness to direct palpation left right-hand side.  No coarse grinding or crepitus in that shoulder to passive range of motion.  Negative apprehension testing.  Does have equivocal O'Brien's testing on the left negative on the right.  Mild tenderness to palpation in the bicipital groove.  Specialty Comments:  No specialty comments available.  Imaging: No results found.   PMFS History: Patient Active Problem List   Diagnosis Date Noted  . Dyslipidemia 11/05/2018  . Hyperglycemia 11/05/2018  . Gastroesophageal reflux disease 08/08/2018  . Anxiety 08/08/2018  . Vitamin D deficiency 10/04/2016   Past Medical History:  Diagnosis Date  . Allergy   . Anxiety   . Constipation   . Depression   . Diverticulitis   . Elevated BP without diagnosis of hypertension   . GERD (gastroesophageal reflux disease)   . HTN (hypertension)   . IBS (irritable bowel syndrome)   . Night muscle spasms    back  . Osteoarthritis (arthritis due to wear and tear of joints)    knees  . Ovarian cyst   . Pain in the abdomen    lower left quarter  . Palpitations   . SOB (shortness of breath) on exertion   . Swelling    feet and legs    Family History  Problem Relation Age of Onset  . Heart disease Father   . Hypertension Father   . Stroke Father   . Diabetes Father   . Hyperlipidemia Father   . Kidney disease Father   . Sleep apnea Father   . Obesity Father    . Breast cancer Maternal Aunt   . Breast cancer Maternal Grandmother   . Anxiety disorder Mother     Past Surgical History:  Procedure Laterality Date  . ABDOMINAL ADHESION SURGERY    . acl replacement  2000  . ovarian cysts     Social History   Occupational History  . Occupation: cosmetologist  Tobacco Use  . Smoking status: Never Smoker  . Smokeless tobacco: Never Used  Substance and Sexual Activity  . Alcohol use: Yes    Comment: occassionally  . Drug use: No  . Sexual activity: Yes    Birth control/protection: I.U.D.

## 2020-09-29 ENCOUNTER — Ambulatory Visit
Admission: RE | Admit: 2020-09-29 | Discharge: 2020-09-29 | Disposition: A | Discharge status: Home / Self Care | Payer: BC Managed Care – PPO | Source: Ambulatory Visit | Attending: Family Medicine | Admitting: Family Medicine

## 2020-09-29 ENCOUNTER — Other Ambulatory Visit: Payer: Self-pay

## 2020-09-29 DIAGNOSIS — Z1231 Encounter for screening mammogram for malignant neoplasm of breast: Secondary | ICD-10-CM | POA: Diagnosis not present

## 2021-02-16 ENCOUNTER — Other Ambulatory Visit: Payer: Self-pay | Admitting: Family Medicine

## 2021-02-25 DIAGNOSIS — Z1289 Encounter for screening for malignant neoplasm of other sites: Secondary | ICD-10-CM | POA: Diagnosis not present

## 2021-02-25 DIAGNOSIS — Z1331 Encounter for screening for depression: Secondary | ICD-10-CM | POA: Diagnosis not present

## 2021-02-25 DIAGNOSIS — R102 Pelvic and perineal pain: Secondary | ICD-10-CM | POA: Diagnosis not present

## 2021-02-25 DIAGNOSIS — Z6832 Body mass index (BMI) 32.0-32.9, adult: Secondary | ICD-10-CM | POA: Diagnosis not present

## 2021-02-25 DIAGNOSIS — Z1339 Encounter for screening examination for other mental health and behavioral disorders: Secondary | ICD-10-CM | POA: Diagnosis not present

## 2021-02-28 LAB — HM PAP SMEAR: HPV Genotype 16/18,45 Tracking: NEGATIVE

## 2021-03-01 ENCOUNTER — Other Ambulatory Visit: Payer: Self-pay | Admitting: Nurse Practitioner

## 2021-03-01 DIAGNOSIS — R102 Pelvic and perineal pain: Secondary | ICD-10-CM

## 2021-03-03 ENCOUNTER — Ambulatory Visit
Admission: RE | Admit: 2021-03-03 | Discharge: 2021-03-03 | Disposition: A | Discharge status: Home / Self Care | Payer: BC Managed Care – PPO | Source: Ambulatory Visit | Attending: Nurse Practitioner | Admitting: Nurse Practitioner

## 2021-03-03 ENCOUNTER — Other Ambulatory Visit: Payer: Self-pay

## 2021-03-03 DIAGNOSIS — N888 Other specified noninflammatory disorders of cervix uteri: Secondary | ICD-10-CM | POA: Diagnosis not present

## 2021-03-03 DIAGNOSIS — R102 Pelvic and perineal pain: Secondary | ICD-10-CM

## 2021-03-10 ENCOUNTER — Other Ambulatory Visit: Payer: Self-pay | Admitting: Nurse Practitioner

## 2021-03-19 ENCOUNTER — Other Ambulatory Visit: Payer: Self-pay | Admitting: Nurse Practitioner

## 2021-03-19 DIAGNOSIS — R102 Pelvic and perineal pain: Secondary | ICD-10-CM

## 2021-04-08 DIAGNOSIS — N951 Menopausal and female climacteric states: Secondary | ICD-10-CM | POA: Diagnosis not present

## 2021-04-08 DIAGNOSIS — R635 Abnormal weight gain: Secondary | ICD-10-CM | POA: Diagnosis not present

## 2021-04-08 DIAGNOSIS — E7800 Pure hypercholesterolemia, unspecified: Secondary | ICD-10-CM | POA: Diagnosis not present

## 2021-04-08 DIAGNOSIS — Z6833 Body mass index (BMI) 33.0-33.9, adult: Secondary | ICD-10-CM | POA: Diagnosis not present

## 2021-04-09 ENCOUNTER — Inpatient Hospital Stay: Admission: RE | Admit: 2021-04-09 | Payer: BC Managed Care – PPO | Source: Ambulatory Visit

## 2021-04-09 LAB — LAB REPORT - SCANNED
A1c: 5.4
EGFR (Non-African Amer.): 74

## 2021-04-14 DIAGNOSIS — Z6833 Body mass index (BMI) 33.0-33.9, adult: Secondary | ICD-10-CM | POA: Diagnosis not present

## 2021-04-14 DIAGNOSIS — R635 Abnormal weight gain: Secondary | ICD-10-CM | POA: Diagnosis not present

## 2021-04-14 DIAGNOSIS — M2550 Pain in unspecified joint: Secondary | ICD-10-CM | POA: Diagnosis not present

## 2021-04-14 DIAGNOSIS — Z1331 Encounter for screening for depression: Secondary | ICD-10-CM | POA: Diagnosis not present

## 2021-04-14 DIAGNOSIS — E7800 Pure hypercholesterolemia, unspecified: Secondary | ICD-10-CM | POA: Diagnosis not present

## 2021-04-14 DIAGNOSIS — Z1339 Encounter for screening examination for other mental health and behavioral disorders: Secondary | ICD-10-CM | POA: Diagnosis not present

## 2021-05-11 ENCOUNTER — Other Ambulatory Visit: Payer: BC Managed Care – PPO

## 2021-05-31 ENCOUNTER — Ambulatory Visit
Admission: RE | Admit: 2021-05-31 | Discharge: 2021-05-31 | Disposition: A | Discharge status: Home / Self Care | Payer: BC Managed Care – PPO | Source: Ambulatory Visit | Attending: Nurse Practitioner | Admitting: Nurse Practitioner

## 2021-05-31 ENCOUNTER — Other Ambulatory Visit: Payer: Self-pay

## 2021-05-31 DIAGNOSIS — R911 Solitary pulmonary nodule: Secondary | ICD-10-CM | POA: Diagnosis not present

## 2021-05-31 DIAGNOSIS — K5730 Diverticulosis of large intestine without perforation or abscess without bleeding: Secondary | ICD-10-CM | POA: Diagnosis not present

## 2021-05-31 DIAGNOSIS — N289 Disorder of kidney and ureter, unspecified: Secondary | ICD-10-CM | POA: Diagnosis not present

## 2021-05-31 DIAGNOSIS — Z8742 Personal history of other diseases of the female genital tract: Secondary | ICD-10-CM | POA: Diagnosis not present

## 2021-05-31 DIAGNOSIS — R102 Pelvic and perineal pain: Secondary | ICD-10-CM

## 2021-05-31 MED ORDER — IOPAMIDOL (ISOVUE-300) INJECTION 61%
100.0000 mL | Freq: Once | INTRAVENOUS | Status: AC | PRN
  Administered 2021-05-31: 100 mL via INTRAVENOUS

## 2021-06-09 DIAGNOSIS — Z6833 Body mass index (BMI) 33.0-33.9, adult: Secondary | ICD-10-CM | POA: Diagnosis not present

## 2021-06-09 DIAGNOSIS — E7800 Pure hypercholesterolemia, unspecified: Secondary | ICD-10-CM | POA: Diagnosis not present

## 2021-07-01 DIAGNOSIS — Z6833 Body mass index (BMI) 33.0-33.9, adult: Secondary | ICD-10-CM | POA: Diagnosis not present

## 2021-07-01 DIAGNOSIS — E7800 Pure hypercholesterolemia, unspecified: Secondary | ICD-10-CM | POA: Diagnosis not present

## 2021-07-12 DIAGNOSIS — Z6832 Body mass index (BMI) 32.0-32.9, adult: Secondary | ICD-10-CM | POA: Diagnosis not present

## 2021-07-12 DIAGNOSIS — E7800 Pure hypercholesterolemia, unspecified: Secondary | ICD-10-CM | POA: Diagnosis not present

## 2021-07-30 DIAGNOSIS — E7800 Pure hypercholesterolemia, unspecified: Secondary | ICD-10-CM | POA: Diagnosis not present

## 2021-07-30 DIAGNOSIS — Z6832 Body mass index (BMI) 32.0-32.9, adult: Secondary | ICD-10-CM | POA: Diagnosis not present

## 2021-09-07 ENCOUNTER — Other Ambulatory Visit: Payer: Self-pay | Admitting: Family Medicine

## 2021-09-07 DIAGNOSIS — Z1231 Encounter for screening mammogram for malignant neoplasm of breast: Secondary | ICD-10-CM

## 2021-09-09 ENCOUNTER — Ambulatory Visit (INDEPENDENT_AMBULATORY_CARE_PROVIDER_SITE_OTHER): Payer: BC Managed Care – PPO | Admitting: Family Medicine

## 2021-09-09 ENCOUNTER — Encounter: Payer: Self-pay | Admitting: Family Medicine

## 2021-09-09 VITALS — BP 137/85 | HR 89 | Temp 98.2°F | Ht 63.5 in | Wt 188.2 lb

## 2021-09-09 DIAGNOSIS — K5900 Constipation, unspecified: Secondary | ICD-10-CM

## 2021-09-09 DIAGNOSIS — F419 Anxiety disorder, unspecified: Secondary | ICD-10-CM

## 2021-09-09 DIAGNOSIS — K5790 Diverticulosis of intestine, part unspecified, without perforation or abscess without bleeding: Secondary | ICD-10-CM

## 2021-09-09 DIAGNOSIS — K5909 Other constipation: Secondary | ICD-10-CM | POA: Insufficient documentation

## 2021-09-09 DIAGNOSIS — E669 Obesity, unspecified: Secondary | ICD-10-CM

## 2021-09-09 MED ORDER — ESCITALOPRAM OXALATE 5 MG PO TABS: 5.0000 mg | ORAL_TABLET | Freq: Every day | ORAL | 1 refills | Status: DC

## 2021-09-09 NOTE — Assessment & Plan Note (Signed)
We had a lengthy discussion today regarding her treatment options.  She is concerned about possible side effects from the Celexa including constipation.  Would be reasonable to switch to an alternative at this point.  We will try Lexapro 5 mg daily.  We discussed similar side effect profile.  She will send me a message in a couple weeks via MyChart.  If she is still having ongoing issues would consider trial of sertraline ?

## 2021-09-09 NOTE — Assessment & Plan Note (Signed)
I had lengthy discussion with patient regarding her weight loss.  She is currently seeing a weight loss clinic with [facility].  They have been working with her on her diet however this is hard for her to maintain.  BMI today is 32.82.  We briefly discussed starting medication such as GLP-1 agonist however her insurance would likely not pay for at this point she has not been diagnosed with type 2 diabetes.  She will continue working on diet and exercise.  Hopefully switching to Lexapro will help some as well.  She will follow-up with me in a few weeks via MyChart. ?

## 2021-09-09 NOTE — Assessment & Plan Note (Signed)
No recent flares of diverticulitis however her most recent CT scan did show diverticulosis.  We will be treating her constipation as above. ?

## 2021-09-09 NOTE — Progress Notes (Signed)
? ?Donna [name] is a 56 y.o. female who presents today for an office visit. ? ?Assessment/Plan:  ?Chronic Problems Addressed Today: ?Anxiety ?We had a lengthy discussion today regarding her treatment options.  She is concerned about possible side effects from the Celexa including constipation.  Would be reasonable to switch to an alternative at this point.  We will try Lexapro 5 mg daily.  We discussed similar side effect profile.  She will send me a message in a couple weeks via MyChart.  If she is still having ongoing issues would consider trial of sertraline ? ?Obesity ?I had lengthy discussion with patient regarding her weight loss.  She is currently seeing a weight loss clinic with [facility].  They have been working with her on her diet however this is hard for her to maintain.  BMI today is 32.82.  We briefly discussed starting medication such as GLP-1 agonist however her insurance would likely not pay for at this point she has not been diagnosed with type 2 diabetes.  She will continue working on diet and exercise.  Hopefully switching to Lexapro will help some as well.  She will follow-up with me in a few weeks via MyChart. ? ?Constipation ?We reviewed her recent CT scan in office today.  Consistent with constipation and she does have moderate stool burden.  It is possible Celexa could be contributing.  She has been trying to take plenty of fiber and stay well-hydrated.  She will start taking laxative combination of mag citrate and MiraLAX.  This combination is worked well for her in the past.  She will let me know if not improving. ? ?Diverticulosis ?No recent flares of diverticulitis however her most recent CT scan did show diverticulosis.  We will be treating her constipation as above. ? ? ?  ?Subjective:  ?HPI: ? ?Please see A/P for status of chronic conditions.  She is here for follow-up today.  Her main concern today is side effects from Celexa.  She has been on this for over a year.  She feels  like it is done well to help control her mood swings however is concerned about worsening her constipation.  She does have a longstanding history of constipation and diverticulosis.  Symptoms seem to be of worsening recently.  She has also been following with weight loss clinic.  They recently got a lab work done which showed normal TSH and other hormone levels.  She had tried weaning off of her Celexa however was not able to do so due to side effects.  She is now taking every other day which seems to be doing okay however still has ongoing issues with constipation, dry hair, dry skin. ?   ?  ?Objective:  ?Physical Exam: ?BP 137/85 (BP Location: Right Arm)   Pulse 89   Temp 98.2 ?F (36.8 ?C) (Temporal)   Ht 5' 3.5" (1.613 m)   Wt 188 lb 3.2 oz (85.4 kg)   SpO2 96%   BMI 32.82 kg/m?   ?Gen: No acute distress, resting comfortably ?CV: Regular rate and rhythm with no murmurs appreciated ?Pulm: Normal work of breathing, clear to auscultation bilaterally with no crackles, wheezes, or rhonchi ?Neuro: Grossly normal, moves all extremities ?Psych: Normal affect and thought content ? ?Time Spent: ?40 minutes of total time was spent on the date of the encounter performing the following actions: chart review prior to seeing the patient, obtaining history, performing a medically necessary exam, reviewing her recent CT scan images in  office, counseling on the treatment plan, placing orders, and documenting in our EHR.  ? ? ?   ? ?Algis Greenhouse. Jerline Pain, MD ?09/09/2021 10:55 AM  ?

## 2021-09-09 NOTE — Assessment & Plan Note (Signed)
We reviewed her recent CT scan in office today.  Consistent with constipation and she does have moderate stool burden.  It is possible Celexa could be contributing.  She has been trying to take plenty of fiber and stay well-hydrated.  She will start taking laxative combination of mag citrate and MiraLAX.  This combination is worked well for her in the past.  She will let me know if not improving. ?

## 2021-09-09 NOTE — Patient Instructions (Signed)
It was very nice to see you today! ? ?Please stop the Celexa and start Lexapro.  You can try a bowel cleanout to see if this helps. ? ?Please send a message in a couple of weeks to let me know how you are doing. ? ?Take care, ?Dr Jimmey Ralph ? ?PLEASE NOTE: ? ?If you had any lab tests please let us know if you have not heard back within a few days. You may see your results on mychart before we have a chance to review them but we will give you a call once they are reviewed by [place]. If we ordered any referrals today, please let us know if you have not heard from their office within the next week.  ? ?Please try these tips to maintain a healthy lifestyle: ? ?Eat at least 3 REAL meals and 1-2 snacks per day.  Aim for no more than 5 hours between eating.  If you eat breakfast, please do so within one hour of getting up.  ? ?Each meal should contain half fruits/vegetables, one quarter protein, and one quarter carbs (no bigger than a computer mouse) ? ?Cut down on sweet beverages. This includes juice, soda, and sweet tea.  ? ?Drink at least 1 glass of water with each meal and aim for at least 8 glasses per day ? ?Exercise at least 150 minutes every week.   ?

## 2021-09-30 ENCOUNTER — Ambulatory Visit
Admission: RE | Admit: 2021-09-30 | Discharge: 2021-09-30 | Disposition: A | Discharge status: Home / Self Care | Payer: BC Managed Care – PPO | Source: Ambulatory Visit | Attending: Family Medicine | Admitting: Family Medicine

## 2021-09-30 DIAGNOSIS — Z1231 Encounter for screening mammogram for malignant neoplasm of breast: Secondary | ICD-10-CM

## 2021-10-01 ENCOUNTER — Ambulatory Visit: Payer: BC Managed Care – PPO

## 2021-10-01 ENCOUNTER — Other Ambulatory Visit: Payer: Self-pay | Admitting: Family Medicine

## 2022-01-14 ENCOUNTER — Other Ambulatory Visit: Payer: Self-pay

## 2022-01-14 DIAGNOSIS — R1013 Epigastric pain: Secondary | ICD-10-CM | POA: Diagnosis not present

## 2022-01-14 DIAGNOSIS — R1011 Right upper quadrant pain: Secondary | ICD-10-CM | POA: Insufficient documentation

## 2022-01-14 DIAGNOSIS — R1012 Left upper quadrant pain: Secondary | ICD-10-CM | POA: Diagnosis not present

## 2022-01-14 NOTE — ED Triage Notes (Signed)
Patient coming to ED for evaluation of epigastric abdominal pain.  Reports symptoms started last night.  Was nauseated.  Pain resolved.  Ate tonight and pain returned.

## 2022-01-15 ENCOUNTER — Emergency Department (HOSPITAL_BASED_OUTPATIENT_CLINIC_OR_DEPARTMENT_OTHER)
Admission: EM | Admit: 2022-01-15 | Discharge: 2022-01-15 | Disposition: A | Discharge status: Home / Self Care | Payer: BC Managed Care – PPO | Attending: Emergency Medicine | Admitting: Emergency Medicine

## 2022-01-15 ENCOUNTER — Emergency Department (HOSPITAL_BASED_OUTPATIENT_CLINIC_OR_DEPARTMENT_OTHER): Payer: BC Managed Care – PPO

## 2022-01-15 DIAGNOSIS — R109 Unspecified abdominal pain: Secondary | ICD-10-CM | POA: Diagnosis not present

## 2022-01-15 DIAGNOSIS — R1013 Epigastric pain: Secondary | ICD-10-CM

## 2022-01-15 DIAGNOSIS — K5730 Diverticulosis of large intestine without perforation or abscess without bleeding: Secondary | ICD-10-CM | POA: Diagnosis not present

## 2022-01-15 LAB — COMPREHENSIVE METABOLIC PANEL
ALT: 15 U/L (ref 0–44)
AST: 18 U/L (ref 15–41)
Albumin: 4.5 g/dL (ref 3.5–5.0)
Alkaline Phosphatase: 61 U/L (ref 38–126)
Anion gap: 11 (ref 5–15)
BUN: 17 mg/dL (ref 6–20)
CO2: 22 mmol/L (ref 22–32)
Calcium: 9.8 mg/dL (ref 8.9–10.3)
Chloride: 103 mmol/L (ref 98–111)
Creatinine, Ser: 0.95 mg/dL (ref 0.44–1.00)
GFR, Estimated: >60 mL/min (ref >60)
Glucose, Bld: 100 mg/dL — ABNORMAL HIGH (ref 70–99)
Potassium: 4.1 mmol/L (ref 3.5–5.1)
Sodium: 136 mmol/L (ref 135–145)
Total Bilirubin: 0.4 mg/dL (ref 0.3–1.2)
Total Protein: 7.8 g/dL (ref 6.5–8.1)

## 2022-01-15 LAB — URINALYSIS, ROUTINE W REFLEX MICROSCOPIC
Bilirubin Urine: NEGATIVE
Glucose, UA: NEGATIVE mg/dL
Ketones, ur: NEGATIVE mg/dL
Leukocytes,Ua: NEGATIVE
Nitrite: NEGATIVE
Protein, ur: NEGATIVE mg/dL
Specific Gravity, Urine: 1.015 (ref 1.005–1.030)
pH: 6 (ref 5.0–8.0)

## 2022-01-15 LAB — CBC
HCT: 41.6 % (ref 36.0–46.0)
Hemoglobin: 14.8 g/dL (ref 12.0–15.0)
MCH: 31.3 pg (ref 26.0–34.0)
MCHC: 35.6 g/dL (ref 30.0–36.0)
MCV: 87.9 fL (ref 80.0–100.0)
Platelets: 321 10*3/uL (ref 150–400)
RBC: 4.73 MIL/uL (ref 3.87–5.11)
RDW: 12.3 % (ref 11.5–15.5)
WBC: 7.5 10*3/uL (ref 4.0–10.5)
nRBC: 0 % (ref 0.0–0.2)

## 2022-01-15 LAB — LIPASE, BLOOD: Lipase: 37 U/L (ref 11–51)

## 2022-01-15 MED ORDER — PANTOPRAZOLE SODIUM 40 MG PO TBEC: 40.0000 mg | DELAYED_RELEASE_TABLET | Freq: Two times a day (BID) | ORAL | 0 refills | Status: DC

## 2022-01-15 MED ORDER — HYDROCODONE-ACETAMINOPHEN 5-325 MG PO TABS: 1.0000 | ORAL_TABLET | Freq: Four times a day (QID) | ORAL | 0 refills | Status: DC | PRN

## 2022-01-15 MED ORDER — IOHEXOL 300 MG/ML  SOLN
100.0000 mL | Freq: Once | INTRAMUSCULAR | Status: AC | PRN
  Administered 2022-01-15: 100 mL via INTRAVENOUS

## 2022-01-15 MED ORDER — SODIUM CHLORIDE 0.9 % IV BOLUS
1000.0000 mL | Freq: Once | INTRAVENOUS | Status: AC
  Administered 2022-01-15: 1000 mL via INTRAVENOUS

## 2022-01-15 MED ORDER — ALUM & MAG HYDROXIDE-SIMETH 200-200-20 MG/5ML PO SUSP
30.0000 mL | Freq: Once | ORAL | Status: AC
  Administered 2022-01-15: 30 mL via ORAL
  Filled 2022-01-15: qty 30

## 2022-01-15 NOTE — ED Provider Notes (Signed)
[facility] EMERGENCY DEPT Provider Note   CSN: [id number] Arrival date & time: 01/14/22  2330     History  Chief Complaint  Patient presents with   Abdominal Pain    Donna [name] is a 56 y.o. female.  Patient is a 56 year old female with past medical history of diverticulitis with 3 prior surgeries in 2021 during which time she reports 1 foot of her colon being removed.  He presents today with complaints of upper abdominal pain.  She describes a "burning" across the top of her abdomen extending from the left upper quadrant across to the right upper quadrant.  This started 2 evenings ago and has worsened.  She had attempted to just drink water throughout the day, then tried eating salad with grilled chicken and the pain worsened.  She has tried taking Pepcid and Tums with little relief.  She denies any fevers or chills.  She denies any diarrhea, constipation, or bloody stool.  She reports that this feels different from her previous episodes of diverticulitis.  The history is provided by the patient.       Home Medications Prior to Admission medications   Medication Sig Start Date End Date Taking? Authorizing Provider  ALPRAZolam (XANAX) 0.25 MG tablet Take 0.5 tablets (0.125 mg total) by mouth 2 (two) times daily as needed for anxiety. Patient not taking: Reported on 09/09/2021 04/17/20   Ardith Dark, MD  escitalopram (LEXAPRO) 5 MG tablet TAKE 1 TABLET (5 MG TOTAL) BY MOUTH DAILY. 10/01/21   Ardith Dark, MD  ibuprofen (ADVIL,MOTRIN) 200 MG tablet Take 200 mg by mouth every 6 (six) hours as needed for moderate pain.    [provider]  levonorgestrel (MIRENA) 20 MCG/24HR IUD 1 each by Intrauterine route once.    [provider]  omeprazole (PRILOSEC) 10 MG capsule Take 10 mg by mouth daily.    [provider]  OVER THE COUNTER MEDICATION Take 4 Doses by mouth daily as needed (for stomach). OTC Fiber Well gummies    [provider]  Vitamin D, Ergocalciferol, (DRISDOL) 1.25 MG (50000 UNIT) CAPS capsule Take 1 capsule (50,000 Units total) by mouth every 7 (seven) days. 06/09/20   Ardith Dark, MD      Allergies    Epinephrine, Augmentin [amoxicillin-pot clavulanate], Codeine, Cortisone, Levaquin [levofloxacin in d5w], Omnipred [prednisolone acetate], Sulfa antibiotics, Sulfasalazine, Tequin [gatifloxacin], and Cefdinir    Review of Systems   Review of Systems  Physical Exam Updated Vital Signs BP (!) 133/102   Pulse 84   Temp 98 F (36.7 C) (Oral)   Resp 20   Ht 5' 3.5" (1.613 m)   Wt 83.9 kg   SpO2 90%   BMI 32.26 kg/m  Physical Exam Vitals and nursing note reviewed.  Constitutional:      General: She is not in acute distress.    Appearance: She is well-developed. She is not diaphoretic.  HENT:     Head: Normocephalic and atraumatic.  Cardiovascular:     Rate and Rhythm: Normal rate and regular rhythm.     Heart sounds: No murmur heard.    No friction rub. No gallop.  Pulmonary:     Effort: Pulmonary effort is normal. No respiratory distress.     Breath sounds: Normal breath sounds. No wheezing.  Abdominal:     General: Bowel sounds are normal. There is no distension.     Palpations: Abdomen is soft.     Tenderness: There is abdominal  tenderness in the right upper quadrant, epigastric area and left upper quadrant. There is no right CVA tenderness, left CVA tenderness, guarding or rebound.  Musculoskeletal:        General: Normal range of motion.     Cervical back: Normal range of motion and neck supple.  Skin:    General: Skin is warm and dry.  Neurological:     General: No focal deficit present.     Mental Status: She is alert and oriented to person, place, and time.     ED Results / Procedures / Treatments   Labs (all labs ordered are listed, but only abnormal results are displayed) Labs Reviewed  CBC  LIPASE, BLOOD  COMPREHENSIVE METABOLIC PANEL  URINALYSIS, ROUTINE W  REFLEX MICROSCOPIC    EKG EKG Interpretation  Date/Time:  Saturday January 15 2022 00:05:52 EDT Ventricular Rate:  93 PR Interval:  122 QRS Duration: 72 QT Interval:  382 QTC Calculation: 474 R Axis:   -40 Text Interpretation: Normal sinus rhythm Left axis deviation Cannot rule out Anterior infarct , age undetermined Abnormal ECG No previous ECGs available Confirmed by Geoffery Lyons ([id number]) on 01/15/2022 12:30:43 AM  Radiology No results found.  Procedures Procedures    Medications Ordered in ED Medications - No data to display  ED Course/ Medical Decision Making/ A&P  Patient is a 56 year old female presenting here with complaints of abdominal pain.  Pain is mostly across the upper abdomen including the left upper quadrant, right upper quadrant, and epigastric regions.    She arrives here uncomfortable, but afebrile, nontoxic appearing, and with normal vital signs.  Work-up initiated including CBC, comprehensive metabolic panel, lipase, all of which were unremarkable.  She did undergo CT scan showing no acute intra-abdominal process.  Pain and nausea was treated with morphine and Zofran and patient is feeling somewhat better.  She was also given a GI cocktail which seemed to help somewhat.  At this point, feel as though she can safely be discharged with Protonix for presumed reflux and follow-up with her gastroenterologist if not improving in the next few days.  Final Clinical Impression(s) / ED Diagnoses Final diagnoses:  None    Rx / DC Orders ED Discharge Orders     None         Geoffery Lyons, MD 01/15/22 [phone]

## 2022-01-15 NOTE — Discharge Instructions (Addendum)
Begin taking Protonix as prescribed.  Begin taking hydrocodone as prescribed as needed for pain.  With your gastroenterologist if symptoms are not improving in the next 3 to 4 days, and return to the ER if you develop worsening pain, high fever, bloody stool or vomit, or for other new and concerning symptoms.

## 2022-01-31 ENCOUNTER — Encounter: Payer: Self-pay | Admitting: *Deleted

## 2022-02-02 DIAGNOSIS — K8020 Calculus of gallbladder without cholecystitis without obstruction: Secondary | ICD-10-CM | POA: Diagnosis not present

## 2022-02-02 DIAGNOSIS — K760 Fatty (change of) liver, not elsewhere classified: Secondary | ICD-10-CM | POA: Diagnosis not present

## 2022-02-03 DIAGNOSIS — K819 Cholecystitis, unspecified: Secondary | ICD-10-CM | POA: Diagnosis not present

## 2022-02-03 DIAGNOSIS — K5790 Diverticulosis of intestine, part unspecified, without perforation or abscess without bleeding: Secondary | ICD-10-CM | POA: Diagnosis not present

## 2022-02-03 DIAGNOSIS — K219 Gastro-esophageal reflux disease without esophagitis: Secondary | ICD-10-CM | POA: Diagnosis not present

## 2022-02-03 DIAGNOSIS — F419 Anxiety disorder, unspecified: Secondary | ICD-10-CM | POA: Diagnosis not present

## 2022-02-04 DIAGNOSIS — K8000 Calculus of gallbladder with acute cholecystitis without obstruction: Secondary | ICD-10-CM | POA: Diagnosis not present

## 2022-02-04 DIAGNOSIS — F419 Anxiety disorder, unspecified: Secondary | ICD-10-CM | POA: Diagnosis not present

## 2022-02-04 DIAGNOSIS — K660 Peritoneal adhesions (postprocedural) (postinfection): Secondary | ICD-10-CM | POA: Diagnosis not present

## 2022-02-04 DIAGNOSIS — D72829 Elevated white blood cell count, unspecified: Secondary | ICD-10-CM | POA: Diagnosis not present

## 2022-02-04 DIAGNOSIS — K82A1 Gangrene of gallbladder in cholecystitis: Secondary | ICD-10-CM | POA: Diagnosis not present

## 2022-02-04 DIAGNOSIS — K810 Acute cholecystitis: Secondary | ICD-10-CM | POA: Diagnosis not present

## 2022-02-04 DIAGNOSIS — K219 Gastro-esophageal reflux disease without esophagitis: Secondary | ICD-10-CM | POA: Diagnosis not present

## 2022-02-05 DIAGNOSIS — F419 Anxiety disorder, unspecified: Secondary | ICD-10-CM | POA: Diagnosis not present

## 2022-02-05 DIAGNOSIS — K219 Gastro-esophageal reflux disease without esophagitis: Secondary | ICD-10-CM | POA: Diagnosis not present

## 2022-02-05 DIAGNOSIS — K82A1 Gangrene of gallbladder in cholecystitis: Secondary | ICD-10-CM | POA: Diagnosis not present

## 2022-02-05 DIAGNOSIS — K660 Peritoneal adhesions (postprocedural) (postinfection): Secondary | ICD-10-CM | POA: Diagnosis not present

## 2022-02-05 DIAGNOSIS — D72829 Elevated white blood cell count, unspecified: Secondary | ICD-10-CM | POA: Diagnosis not present

## 2022-02-05 DIAGNOSIS — K8000 Calculus of gallbladder with acute cholecystitis without obstruction: Secondary | ICD-10-CM | POA: Diagnosis not present

## 2022-02-06 DIAGNOSIS — K82A1 Gangrene of gallbladder in cholecystitis: Secondary | ICD-10-CM | POA: Diagnosis not present

## 2022-02-06 DIAGNOSIS — K660 Peritoneal adhesions (postprocedural) (postinfection): Secondary | ICD-10-CM | POA: Diagnosis not present

## 2022-02-06 DIAGNOSIS — F419 Anxiety disorder, unspecified: Secondary | ICD-10-CM | POA: Diagnosis not present

## 2022-02-06 DIAGNOSIS — K219 Gastro-esophageal reflux disease without esophagitis: Secondary | ICD-10-CM | POA: Diagnosis not present

## 2022-02-06 DIAGNOSIS — D72829 Elevated white blood cell count, unspecified: Secondary | ICD-10-CM | POA: Diagnosis not present

## 2022-02-06 DIAGNOSIS — K8000 Calculus of gallbladder with acute cholecystitis without obstruction: Secondary | ICD-10-CM | POA: Diagnosis not present

## 2022-04-21 ENCOUNTER — Encounter: Payer: Self-pay | Admitting: *Deleted

## 2022-04-22 ENCOUNTER — Other Ambulatory Visit: Payer: Self-pay | Admitting: Family Medicine

## 2022-05-31 ENCOUNTER — Ambulatory Visit (INDEPENDENT_AMBULATORY_CARE_PROVIDER_SITE_OTHER): Payer: BC Managed Care – PPO | Admitting: Physician Assistant

## 2022-05-31 ENCOUNTER — Encounter: Payer: Self-pay | Admitting: Physician Assistant

## 2022-05-31 VITALS — BP 124/90 | HR 88 | Temp 98.2°F | Ht 63.5 in | Wt 189.0 lb

## 2022-05-31 DIAGNOSIS — R030 Elevated blood-pressure reading, without diagnosis of hypertension: Secondary | ICD-10-CM

## 2022-05-31 LAB — CBC WITH DIFFERENTIAL/PLATELET
Basophils Absolute: 0 10*3/uL (ref 0.0–0.1)
Basophils Relative: 0.3 % (ref 0.0–3.0)
Eosinophils Absolute: 0.1 10*3/uL (ref 0.0–0.7)
Eosinophils Relative: 1.5 % (ref 0.0–5.0)
HCT: 42.8 % (ref 36.0–46.0)
Hemoglobin: 14.7 g/dL (ref 12.0–15.0)
Lymphocytes Relative: 29.4 % (ref 12.0–46.0)
Lymphs Abs: 1.6 10*3/uL (ref 0.7–4.0)
MCHC: 34.2 g/dL (ref 30.0–36.0)
MCV: 90.8 fl (ref 78.0–100.0)
Monocytes Absolute: 0.4 10*3/uL (ref 0.1–1.0)
Monocytes Relative: 7.2 % (ref 3.0–12.0)
Neutro Abs: 3.4 10*3/uL (ref 1.4–7.7)
Neutrophils Relative %: 61.6 % (ref 43.0–77.0)
Platelets: 322 10*3/uL (ref 150.0–400.0)
RBC: 4.71 Mil/uL (ref 3.87–5.11)
RDW: 13.1 % (ref 11.5–15.5)
WBC: 5.6 10*3/uL (ref 4.0–10.5)

## 2022-05-31 LAB — COMPREHENSIVE METABOLIC PANEL
ALT: 20 U/L (ref 0–35)
AST: 18 U/L (ref 0–37)
Albumin: 4.5 g/dL (ref 3.5–5.2)
Alkaline Phosphatase: 79 U/L (ref 39–117)
BUN: 19 mg/dL (ref 6–23)
CO2: 28 mEq/L (ref 19–32)
Calcium: 9.5 mg/dL (ref 8.4–10.5)
Chloride: 104 mEq/L (ref 96–112)
Creatinine, Ser: 0.94 mg/dL (ref 0.40–1.20)
GFR: 68.09 mL/min (ref >60.00)
Glucose, Bld: 111 mg/dL — ABNORMAL HIGH (ref 70–99)
Potassium: 4.5 mEq/L (ref 3.5–5.1)
Sodium: 141 mEq/L (ref 135–145)
Total Bilirubin: 0.5 mg/dL (ref 0.2–1.2)
Total Protein: 7.6 g/dL (ref 6.0–8.3)

## 2022-05-31 LAB — TSH: TSH: 2.01 u[IU]/mL (ref 0.35–5.50)

## 2022-05-31 LAB — MICROALBUMIN / CREATININE URINE RATIO
Creatinine,U: 322.5 mg/dL
Microalb Creat Ratio: 0.5 mg/g (ref 0.0–30.0)
Microalb, Ur: 1.5 mg/dL (ref 0.0–1.9)

## 2022-05-31 NOTE — Patient Instructions (Addendum)
It was great to see you!  Please keep an eye on your blood pressure If your blood pressure is consistently > 140/90, please call us  Otherwise, please follow-up with Dr Jerline Pain in 1-2 weeks to have your blood pressure readings reviewed and to determine next steps  I also recommend that you bring your blood pressure monitor with you at follow-up so we can see what your machines readings compare to ours  Take care,  Inda Coke PA-C

## 2022-05-31 NOTE — Progress Notes (Signed)
Donna [name] is a 57 y.o. female here for a follow up of a pre-existing problem.  History of Present Illness:   Chief Complaint  Patient presents with   Headache    Pt c/o headache off and on x few weeks, has been checking blood pressure and it was 141/102, 160/103.Slight dizziness.    Elevated blood pressure readings She is concerned about her blood pressure Lap chole 02/04/22 -- has had elevated BP ever since BP usually 100/70 per patient Since surgery, having episodes of face feeling flushed and "dizziness" -- having pressure in her head/ears -- denies worst HA of life or thunderclap sx  She is UTD with the eye doctor -- went last week with no significant change in her eye rx  No excessive caffeine intake Occasional alcohol intake Rare salt intake Rare NSAID intake  Bought BP cuff yesterday --> checked 3-4 times -- 141/102, 161/103, 131/96  Had gestational HTN Has documented "elevated blood pressure without dx of HTN" in our epic system  Patient denies chest pain, SOB, blurred vision, lower leg swelling, significant increase in anxiety.  BP Readings from Last 3 Encounters:  05/31/22 (!) 122/90  01/15/22 (!) 132/92  09/09/21 137/85      Past Medical History:  Diagnosis Date   Allergy    Anxiety    Constipation    Depression    Diverticulitis    Elevated BP without diagnosis of hypertension    GERD (gastroesophageal reflux disease)    HTN (hypertension)    IBS (irritable bowel syndrome)    Night muscle spasms    back   Osteoarthritis (arthritis due to wear and tear of joints)    knees   Ovarian cyst    Pain in the abdomen    lower left quarter   Palpitations    SOB (shortness of breath) on exertion    Swelling    feet and legs     Social History   Tobacco Use   Smoking status: Never   Smokeless tobacco: Never  Substance Use Topics   Alcohol use: Yes    Comment: occassionally   Drug use: No    Past Surgical History:  Procedure Laterality  Date   ABDOMINAL ADHESION SURGERY     acl replacement  2000   ovarian cysts      Family History  Problem Relation Age of Onset   Heart disease Father    Hypertension Father    Stroke Father    Diabetes Father    Hyperlipidemia Father    Kidney disease Father    Sleep apnea Father    Obesity Father    Breast cancer Maternal Aunt    Breast cancer Maternal Grandmother    Anxiety disorder Mother     Allergies  Allergen Reactions   Epinephrine Other (See Comments)    Shakes like shes having a seizure   Augmentin [Amoxicillin-Pot Clavulanate] Other (See Comments)    States it burns her inside    Codeine Hives and Other (See Comments)    Upset stomach   Cortisone Swelling   Levaquin [Levofloxacin In D5w] Other (See Comments)    Blisters on hands   Omnipred [Prednisolone Acetate] Other (See Comments)    Migraine, emesis   Sulfa Antibiotics Nausea And Vomiting   Sulfasalazine Nausea And Vomiting   Tequin [Gatifloxacin] Other (See Comments)    Blisters on hands   Cefdinir Nausea And Vomiting    Reports blisters on hands and feet and migraines.  Current Medications:   Current Outpatient Medications:    ALPRAZolam (XANAX) 0.25 MG tablet, Take 0.5 tablets (0.125 mg total) by mouth 2 (two) times daily as needed for anxiety., Disp: 20 tablet, Rfl: 0   escitalopram (LEXAPRO) 5 MG tablet, TAKE 1 TABLET (5 MG TOTAL) BY MOUTH DAILY., Disp: 90 tablet, Rfl: 1   ibuprofen (ADVIL,MOTRIN) 200 MG tablet, Take 200 mg by mouth every 6 (six) hours as needed for moderate pain., Disp: , Rfl:    levonorgestrel (MIRENA) 20 MCG/24HR IUD, 1 each by Intrauterine route once., Disp: , Rfl:    omeprazole (PRILOSEC) 10 MG capsule, Take 10 mg by mouth daily., Disp: , Rfl:    Review of Systems:   Review of Systems  Neurological:  Positive for headaches.   Negative unless otherwise specified per HPI.  Vitals:   Vitals:   05/31/22 0908  BP: (!) 122/90  Pulse: 88  Temp: 98.2 F (36.8 C)   TempSrc: Temporal  SpO2: 98%  Weight: 189 lb (85.7 kg)  Height: 5' 3.5" (1.613 m)     Body mass index is 32.95 kg/m.  Physical Exam:   Physical Exam Vitals and nursing note reviewed.  Constitutional:      General: She is not in acute distress.    Appearance: She is well-developed. She is not ill-appearing or toxic-appearing.  Cardiovascular:     Rate and Rhythm: Normal rate and regular rhythm.     Pulses: Normal pulses.     Heart sounds: Normal heart sounds, S1 normal and S2 normal.  Pulmonary:     Effort: Pulmonary effort is normal.     Breath sounds: Normal breath sounds.  Skin:    General: Skin is warm and dry.  Neurological:     General: No focal deficit present.     Mental Status: She is alert.     GCS: GCS eye subscore is 4. GCS verbal subscore is 5. GCS motor subscore is 6.     Cranial Nerves: Cranial nerves 2-12 are intact.     Sensory: Sensation is intact.     Motor: Motor function is intact.     Coordination: Coordination is intact.     Gait: Gait is intact.  Psychiatric:        Speech: Speech normal.        Behavior: Behavior normal. Behavior is cooperative.     Assessment and Plan:   Elevated blood pressure reading No red flags BP on exam is 122/90 and then 124/90 on recheck I do not feel as though she needs medication -- she would be at increased risk of hypotension if we started medication today Neuro exam benign Recommend checking BP daily, recording on log Push fluids and rest Follow-up with Dr Jerline Pain in 1-2 weeks to review BP and bring in home cuff If new/worsening sx, she is to reach out to [place] Update blood work and urine today to r/o organic cause of elevation  Time spent with patient today was 35 minutes which consisted of chart review, discussing diagnosis, work up, treatment answering questions and documentation.  Inda Coke, PA-C

## 2022-06-06 ENCOUNTER — Ambulatory Visit: Payer: BC Managed Care – PPO | Admitting: Family Medicine

## 2022-06-09 ENCOUNTER — Ambulatory Visit (INDEPENDENT_AMBULATORY_CARE_PROVIDER_SITE_OTHER): Payer: BC Managed Care – PPO | Admitting: Family Medicine

## 2022-06-09 ENCOUNTER — Encounter: Payer: Self-pay | Admitting: Family Medicine

## 2022-06-09 VITALS — BP 130/88 | HR 92 | Temp 97.7°F | Ht 63.5 in | Wt 190.6 lb

## 2022-06-09 DIAGNOSIS — R030 Elevated blood-pressure reading, without diagnosis of hypertension: Secondary | ICD-10-CM

## 2022-06-09 DIAGNOSIS — F419 Anxiety disorder, unspecified: Secondary | ICD-10-CM | POA: Diagnosis not present

## 2022-06-09 MED ORDER — ESCITALOPRAM OXALATE 5 MG PO TABS: 10.0000 mg | ORAL_TABLET | Freq: Every day | ORAL | 1 refills | Status: DC

## 2022-06-09 MED ORDER — ALPRAZOLAM 0.25 MG PO TABS: 0.1250 mg | ORAL_TABLET | Freq: Two times a day (BID) | ORAL | 0 refills | Status: AC | PRN

## 2022-06-09 NOTE — Assessment & Plan Note (Signed)
Blood pressure well-controlled today.  She does have an occasional elevated reading at home but averaging at goal per JNC 8.  Do not think she would benefit much from starting antihypertensives at this point.  She is under quite a bit of stress which is likely contributing.  We did discuss lifestyle modifications including regular exercise and low-sodium diet.  Will be treating her anxiety as below.  She will come back in a couple of weeks for CPE.

## 2022-06-09 NOTE — Progress Notes (Signed)
   Donna [name] is a 57 y.o. female who presents today for an office visit.  Assessment/Plan:  Chronic Problems Addressed Today: Elevated blood pressure reading Blood pressure well-controlled today.  She does have an occasional elevated reading at home but averaging at goal per JNC 8.  Do not think she would benefit much from starting antihypertensives at this point.  She is under quite a bit of stress which is likely contributing.  We did discuss lifestyle modifications including regular exercise and low-sodium diet.  Will be treating her anxiety as below.  She will come back in a couple of weeks for CPE.  Anxiety Symptoms or not controlled.  Likely contributing to her intermittent elevated blood pressure readings as well.  Will increase her Lexapro to 10 mg daily.  She also has Xanax to use as needed.  She uses very sparingly.  Will place referral for her to see therapist.  She will follow-up me in a couple of weeks and we can titrate meds as needed.     Subjective:  HPI:  Patient here today for follow-up.  See A/P for status of chronic conditions.    She was seen here about a week ago by different provider for elevated blood pressure reading.  Blood pressure at that time was at goal.  She was told to monitor at home and follow up in a week or two. Home readings have been as high as 160/90. Has had a few diastolic readings in the 401U. She walks her dogs twice daily. She is not following any specific eating plans.  No reported chest pain or shortness of breath.  She did have her gallbladder taken out a few months ago due to an episode of acute cholecystitis.  She does feel like her blood pressure has been elevated since then.   She does admit that she has been under more stress recently. She has been compliant with her medications.  She has had increased stress at home and at work.  Sometimes makes it difficult for her to her accomplish tasks due to the stress.        Objective:   Physical Exam: BP 130/88   Pulse 92   Temp 97.7 F (36.5 C) (Temporal)   Ht 5' 3.5" (1.613 m)   Wt 190 lb 9.6 oz (86.5 kg)   SpO2 97%   BMI 33.23 kg/m   Wt Readings from Last 3 Encounters:  06/09/22 190 lb 9.6 oz (86.5 kg)  05/31/22 189 lb (85.7 kg)  01/14/22 185 lb (83.9 kg)    Gen: No acute distress, resting comfortably CV: Regular rate and rhythm with no murmurs appreciated Pulm: Normal work of breathing, clear to auscultation bilaterally with no crackles, wheezes, or rhonchi Neuro: Grossly normal, moves all extremities Psych: Normal affect and thought content      Quention Mcneill M. Jerline Pain, MD 06/09/2022 10:46 AM

## 2022-06-09 NOTE — Patient Instructions (Signed)
It was very nice to see you today!  Please continue to keep an eye on your blood pressure.  We do not need to start any medications for this at this point  We need to work on reducing your stress.  Please increase your Lexapro to 10 mg daily.  I will refer you to see a therapist.  Will see back in a couple of weeks.  Take care, Dr Jerline Pain  PLEASE NOTE:  If you had any lab tests, please let us know if you have not heard back within a few days. You may see your results on mychart before we have a chance to review them but we will give you a call once they are reviewed by [place].   If we ordered any referrals today, please let us know if you have not heard from their office within the next week.   If you had any urgent prescriptions sent in today, please check with the pharmacy within an hour of our visit to make sure the prescription was transmitted appropriately.   Please try these tips to maintain a healthy lifestyle:  Eat at least 3 REAL meals and 1-2 snacks per day.  Aim for no more than 5 hours between eating.  If you eat breakfast, please do so within one hour of getting up.   Each meal should contain half fruits/vegetables, one quarter protein, and one quarter carbs (no bigger than a computer mouse)  Cut down on sweet beverages. This includes juice, soda, and sweet tea.   Drink at least 1 glass of water with each meal and aim for at least 8 glasses per day  Exercise at least 150 minutes every week.

## 2022-06-09 NOTE — Assessment & Plan Note (Signed)
Symptoms or not controlled.  Likely contributing to her intermittent elevated blood pressure readings as well.  Will increase her Lexapro to 10 mg daily.  She also has Xanax to use as needed.  She uses very sparingly.  Will place referral for her to see therapist.  She will follow-up me in a couple of weeks and we can titrate meds as needed.

## 2022-06-21 ENCOUNTER — Encounter: Payer: Self-pay | Admitting: Family Medicine

## 2022-06-21 ENCOUNTER — Ambulatory Visit (INDEPENDENT_AMBULATORY_CARE_PROVIDER_SITE_OTHER): Payer: BC Managed Care – PPO | Admitting: Family Medicine

## 2022-06-21 VITALS — BP 112/78 | HR 83 | Temp 97.7°F | Ht 63.5 in | Wt 189.8 lb

## 2022-06-21 DIAGNOSIS — F419 Anxiety disorder, unspecified: Secondary | ICD-10-CM

## 2022-06-21 DIAGNOSIS — E559 Vitamin D deficiency, unspecified: Secondary | ICD-10-CM

## 2022-06-21 DIAGNOSIS — Z0001 Encounter for general adult medical examination with abnormal findings: Secondary | ICD-10-CM

## 2022-06-21 DIAGNOSIS — E785 Hyperlipidemia, unspecified: Secondary | ICD-10-CM

## 2022-06-21 DIAGNOSIS — K219 Gastro-esophageal reflux disease without esophagitis: Secondary | ICD-10-CM | POA: Diagnosis not present

## 2022-06-21 DIAGNOSIS — R739 Hyperglycemia, unspecified: Secondary | ICD-10-CM

## 2022-06-21 DIAGNOSIS — R030 Elevated blood-pressure reading, without diagnosis of hypertension: Secondary | ICD-10-CM

## 2022-06-21 LAB — LIPID PANEL
Cholesterol: 241 mg/dL — ABNORMAL HIGH (ref 0–200)
HDL: 44.6 mg/dL (ref >39.00)
LDL Cholesterol: 156 mg/dL — ABNORMAL HIGH (ref 0–99)
NonHDL: 196.17
Total CHOL/HDL Ratio: 5
Triglycerides: 200 mg/dL — ABNORMAL HIGH (ref 0.0–149.0)
VLDL: 40 mg/dL (ref 0.0–40.0)

## 2022-06-21 LAB — HEMOGLOBIN A1C: Hgb A1c MFr Bld: 5.9 % (ref 4.6–6.5)

## 2022-06-21 LAB — VITAMIN B12: Vitamin B-12: 253 pg/mL (ref 211–911)

## 2022-06-21 LAB — VITAMIN D 25 HYDROXY (VIT D DEFICIENCY, FRACTURES): VITD: 33.08 ng/mL (ref 30.00–100.00)

## 2022-06-21 NOTE — Assessment & Plan Note (Signed)
Check vitamin D.  Not currently on any vitamin D supplementation.

## 2022-06-21 NOTE — Progress Notes (Signed)
Chief Complaint:  Donna [name] is a 57 y.o. female who presents today for her annual comprehensive physical exam.    Assessment/Plan:  Chronic Problems Addressed Today: Elevated blood pressure reading Blood pressure at goal today.  May have had some recent elevations due to increased stress and anxiety which we are treating as below.  She will continue to monitor at home and let us know persistently elevated.  Anxiety She is doing much better with new dose of Lexapro at 10 mg daily.  She is having some mild side effects including dry mouth and constipation but this is currently manageable.  We did discuss altering the dose however she would like to leave it where it is for now.  This is reasonable.  She will also continue taking her Xanax to use as needed.  She will let us know if symptoms worsen or side effects become intolerable.  Vitamin D deficiency Check vitamin D.  Not currently on any vitamin D supplementation.  Gastroesophageal reflux disease Takes Prilosec 10 mg daily as needed.  Will check B12 level today.  Dyslipidemia Discussed lifestyle modifications.  Check lipids.  Hyperglycemia Check A1c.   Preventative Healthcare: Check labs.  Follows with OB/GYN for Pap and mammogram.  Patient Counseling(The following topics were reviewed and/or handout was given):  -Nutrition: Stressed importance of moderation in sodium/caffeine intake, saturated fat and cholesterol, caloric balance, sufficient intake of fresh fruits, vegetables, and fiber.  -Stressed the importance of regular exercise.   -Substance Abuse: Discussed cessation/primary prevention of tobacco, alcohol, or other drug use; driving or other dangerous activities under the influence; availability of treatment for abuse.   -Injury prevention: Discussed safety belts, safety helmets, smoke detector, smoking near bedding or upholstery.   -Sexuality: Discussed sexually transmitted diseases, partner selection, use of  condoms, avoidance of unintended pregnancy and contraceptive alternatives.   -Dental health: Discussed importance of regular tooth brushing, flossing, and dental visits.  -Health maintenance and immunizations reviewed. Please refer to Health maintenance section.  Return to care in 1 year for next preventative visit.     Subjective:  HPI:  She has no acute complaints today.  See A/P for status of chronic conditions.  We saw her a few weeks ago for anxiety and concern for elevated blood pressure readings.  Her blood pressure readings have been fine at home for the last couple of weeks.  We did increase her Lexapro to 10 mg daily.  She feels like this has helped with her anxiety.  She has had a little bit more constipation and dry mouth with the higher dose of Lexapro but this is manageable.  Lifestyle Diet: Balanced. Plenty of fruits and vegetables.  Exercise: Trying to walk more.      06/21/2022    9:59 AM  Depression screen PHQ 2/9  Decreased Interest 0  Down, Depressed, Hopeless 0  PHQ - 2 Score 0  Altered sleeping 0  Tired, decreased energy 0  Change in appetite 0  Feeling bad or failure about yourself  0  Trouble concentrating 0  Moving slowly or fidgety/restless 0  Suicidal thoughts 0  PHQ-9 Score 0    Health Maintenance Due  Topic Date Due   DTaP/Tdap/Td (1 - Tdap) Never done   PAP SMEAR-Modifier  10/19/2020     ROS: Per HPI, otherwise a complete review of systems was negative.   PMH:  The following were reviewed and entered/updated in epic: Past Medical History:  Diagnosis Date   Allergy  Anxiety    Constipation    Depression    Diverticulitis    Elevated BP without diagnosis of hypertension    GERD (gastroesophageal reflux disease)    HTN (hypertension)    IBS (irritable bowel syndrome)    Night muscle spasms    back   Osteoarthritis (arthritis due to wear and tear of joints)    knees   Ovarian cyst    Pain in the abdomen    lower left quarter    Palpitations    SOB (shortness of breath) on exertion    Swelling    feet and legs   Patient Active Problem List   Diagnosis Date Noted   Elevated blood pressure reading 06/09/2022   Diverticulosis 09/09/2021   Constipation 09/09/2021   Obesity 09/09/2021   Dyslipidemia 11/05/2018   Hyperglycemia 11/05/2018   Gastroesophageal reflux disease 08/08/2018   Anxiety 08/08/2018   Vitamin D deficiency 10/04/2016   Past Surgical History:  Procedure Laterality Date   ABDOMINAL ADHESION SURGERY     acl replacement  2000   ovarian cysts      Family History  Problem Relation Age of Onset   Heart disease Father    Hypertension Father    Stroke Father    Diabetes Father    Hyperlipidemia Father    Kidney disease Father    Sleep apnea Father    Obesity Father    Breast cancer Maternal Aunt    Breast cancer Maternal Grandmother    Anxiety disorder Mother     Medications- reviewed and updated Current Outpatient Medications  Medication Sig Dispense Refill   ALPRAZolam (XANAX) 0.25 MG tablet Take 0.5 tablets (0.125 mg total) by mouth 2 (two) times daily as needed for anxiety. 20 tablet 0   escitalopram (LEXAPRO) 5 MG tablet Take 2 tablets (10 mg total) by mouth daily. 90 tablet 1   ibuprofen (ADVIL,MOTRIN) 200 MG tablet Take 200 mg by mouth every 6 (six) hours as needed for moderate pain.     levonorgestrel (MIRENA) 20 MCG/24HR IUD 1 each by Intrauterine route once.     omeprazole (PRILOSEC) 10 MG capsule Take 10 mg by mouth daily.     No current facility-administered medications for this visit.    Allergies-reviewed and updated Allergies  Allergen Reactions   Epinephrine Other (See Comments)    Shakes like shes having a seizure   Augmentin [Amoxicillin-Pot Clavulanate] Other (See Comments)    States it burns her inside    Codeine Hives and Other (See Comments)    Upset stomach   Cortisone Swelling   Levaquin [Levofloxacin In D5w] Other (See Comments)    Blisters on hands    Omnipred [Prednisolone Acetate] Other (See Comments)    Migraine, emesis   Sulfa Antibiotics Nausea And Vomiting   Sulfasalazine Nausea And Vomiting   Tequin [Gatifloxacin] Other (See Comments)    Blisters on hands   Cefdinir Nausea And Vomiting    Reports blisters on hands and feet and migraines.    Social History   Socioeconomic History   Marital status: Married    Spouse name: Not on file   Number of children: 2   Years of education: Not on file   Highest education level: Not on file  Occupational History   Occupation: cosmetologist  Tobacco Use   Smoking status: Never   Smokeless tobacco: Never  Substance and Sexual Activity   Alcohol use: Yes    Comment: occassionally   Drug use: No  Sexual activity: Yes    Birth control/protection: I.U.D.  Other Topics Concern   Not on file  Social History Narrative   Not on file   Social Determinants of Health   Financial Resource Strain: Not on file  Food Insecurity: Not on file  Transportation Needs: Not on file  Physical Activity: Not on file  Stress: Not on file  Social Connections: Not on file        Objective:  Physical Exam: BP 112/78   Pulse 83   Temp 97.7 F (36.5 C) (Temporal)   Ht 5' 3.5" (1.613 m)   Wt 189 lb 12.8 oz (86.1 kg)   SpO2 98%   BMI 33.09 kg/m   Body mass index is 33.09 kg/m. Wt Readings from Last 3 Encounters:  06/21/22 189 lb 12.8 oz (86.1 kg)  06/09/22 190 lb 9.6 oz (86.5 kg)  05/31/22 189 lb (85.7 kg)   Gen: NAD, resting comfortably HEENT: TMs normal bilaterally. OP clear. No thyromegaly noted.  CV: RRR with no murmurs appreciated Pulm: NWOB, CTAB with no crackles, wheezes, or rhonchi GI: Normal bowel sounds present. Soft, Nontender, Nondistended. MSK: no edema, cyanosis, or clubbing noted Skin: warm, dry Neuro: CN2-12 grossly intact. Strength 5/5 in upper and lower extremities. Reflexes symmetric and intact bilaterally.  Psych: Normal affect and thought content     Jaspal Pultz  M. Jerline Pain, MD 06/21/2022 10:31 AM

## 2022-06-21 NOTE — Assessment & Plan Note (Signed)
Blood pressure at goal today.  May have had some recent elevations due to increased stress and anxiety which we are treating as below.  She will continue to monitor at home and let us know persistently elevated.

## 2022-06-21 NOTE — Assessment & Plan Note (Signed)
Discussed lifestyle modifications. Check lipids.  ?

## 2022-06-21 NOTE — Patient Instructions (Addendum)
It was very nice to see you today!  We will check blood work today.  Will continue your current medications for now.  Will see you back in year for your next physical.  Come back sooner if needed.  Take care, Dr Jerline Pain  PLEASE NOTE:  If you had any lab tests, please let us know if you have not heard back within a few days. You may see your results on mychart before we have a chance to review them but we will give you a call once they are reviewed by [place].   If we ordered any referrals today, please let us know if you have not heard from their office within the next week.   If you had any urgent prescriptions sent in today, please check with the pharmacy within an hour of our visit to make sure the prescription was transmitted appropriately.   Please try these tips to maintain a healthy lifestyle:  Eat at least 3 REAL meals and 1-2 snacks per day.  Aim for no more than 5 hours between eating.  If you eat breakfast, please do so within one hour of getting up.   Each meal should contain half fruits/vegetables, one quarter protein, and one quarter carbs (no bigger than a computer mouse)  Cut down on sweet beverages. This includes juice, soda, and sweet tea.   Drink at least 1 glass of water with each meal and aim for at least 8 glasses per day  Exercise at least 150 minutes every week.    Preventive Care 22-25 Years Old, Female Preventive care refers to lifestyle choices and visits with your health care provider that can promote health and wellness. Preventive care visits are also called wellness exams. What can I expect for my preventive care visit? Counseling Your health care provider may ask you questions about your: Medical history, including: Past medical problems. Family medical history. Pregnancy history. Current health, including: Menstrual cycle. Method of birth control. Emotional well-being. Home life and relationship well-being. Sexual activity and sexual  health. Lifestyle, including: Alcohol, nicotine or tobacco, and drug use. Access to firearms. Diet, exercise, and sleep habits. Work and work Statistician. Sunscreen use. Safety issues such as seatbelt and bike helmet use. Physical exam Your health care provider will check your: Height and weight. These may be used to calculate your BMI (body mass index). BMI is a measurement that tells if you are at a healthy weight. Waist circumference. This measures the distance around your waistline. This measurement also tells if you are at a healthy weight and may help predict your risk of certain diseases, such as type 2 diabetes and high blood pressure. Heart rate and blood pressure. Body temperature. Skin for abnormal spots. What immunizations do I need?  Vaccines are usually given at various ages, according to a schedule. Your health care provider will recommend vaccines for you based on your age, medical history, and lifestyle or other factors, such as travel or where you work. What tests do I need? Screening Your health care provider may recommend screening tests for certain conditions. This may include: Lipid and cholesterol levels. Diabetes screening. This is done by checking your blood sugar (glucose) after you have not eaten for a while (fasting). Pelvic exam and Pap test. Hepatitis B test. Hepatitis C test. HIV (human immunodeficiency virus) test. STI (sexually transmitted infection) testing, if you are at risk. Lung cancer screening. Colorectal cancer screening. Mammogram. Talk with your health care provider about when you should start having  regular mammograms. This may depend on whether you have a family history of breast cancer. BRCA-related cancer screening. This may be done if you have a family history of breast, ovarian, tubal, or peritoneal cancers. Bone density scan. This is done to screen for osteoporosis. Talk with your health care provider about your test results,  treatment options, and if necessary, the need for more tests. Follow these instructions at home: Eating and drinking  Eat a diet that includes fresh fruits and vegetables, whole grains, lean protein, and low-fat dairy products. Take vitamin and mineral supplements as recommended by your health care provider. Do not drink alcohol if: Your health care provider tells you not to drink. You are pregnant, may be pregnant, or are planning to become pregnant. If you drink alcohol: Limit how much you have to 0-1 drink a day. Know how much alcohol is in your drink. In the [place] one drink equals one 12 oz bottle of beer (355 mL), one 5 oz glass of wine (148 mL), or one 1 oz glass of hard liquor (44 mL). Lifestyle Brush your teeth every morning and night with fluoride toothpaste. Floss one time each day. Exercise for at least 30 minutes 5 or more days each week. Do not use any products that contain nicotine or tobacco. These products include cigarettes, chewing tobacco, and vaping devices, such as e-cigarettes. If you need help quitting, ask your health care provider. Do not use drugs. If you are sexually active, practice safe sex. Use a condom or other form of protection to prevent STIs. If you do not wish to become pregnant, use a form of birth control. If you plan to become pregnant, see your health care provider for a prepregnancy visit. Take aspirin only as told by your health care provider. Make sure that you understand how much to take and what form to take. Work with your health care provider to find out whether it is safe and beneficial for you to take aspirin daily. Find healthy ways to manage stress, such as: Meditation, yoga, or listening to music. Journaling. Talking to a trusted person. Spending time with friends and family. Minimize exposure to UV radiation to reduce your risk of skin cancer. Safety Always wear your seat belt while driving or riding in a vehicle. Do not drive: If you  have been drinking alcohol. Do not ride with someone who has been drinking. When you are tired or distracted. While texting. If you have been using any mind-altering substances or drugs. Wear a helmet and other protective equipment during sports activities. If you have firearms in your house, make sure you follow all gun safety procedures. Seek help if you have been physically or sexually abused. What's next? Visit your health care provider once a year for an annual wellness visit. Ask your health care provider how often you should have your eyes and teeth checked. Stay up to date on all vaccines. This information is not intended to replace advice given to you by your health care provider. Make sure you discuss any questions you have with your health care provider. Document Revised: 10/21/2020 Document Reviewed: 10/21/2020 Elsevier Patient Education  [place].

## 2022-06-21 NOTE — Assessment & Plan Note (Signed)
Takes Prilosec 10 mg daily as needed.  Will check B12 level today.

## 2022-06-21 NOTE — Assessment & Plan Note (Signed)
She is doing much better with new dose of Lexapro at 10 mg daily.  She is having some mild side effects including dry mouth and constipation but this is currently manageable.  We did discuss altering the dose however she would like to leave it where it is for now.  This is reasonable.  She will also continue taking her Xanax to use as needed.  She will let us know if symptoms worsen or side effects become intolerable.

## 2022-06-21 NOTE — Assessment & Plan Note (Signed)
Check A1c. 

## 2022-06-24 ENCOUNTER — Other Ambulatory Visit: Payer: Self-pay

## 2022-06-24 DIAGNOSIS — E538 Deficiency of other specified B group vitamins: Secondary | ICD-10-CM

## 2022-06-24 DIAGNOSIS — E559 Vitamin D deficiency, unspecified: Secondary | ICD-10-CM

## 2022-06-24 NOTE — Progress Notes (Signed)
Please inform patient of the following:  Her B12 and vitamin D are on the low side of normal.  She can continue with supplementation B12 1000 mcg daily and vitamin D 1000 IUs daily and we can recheck this again in 6 to 12 months.  Her cholesterol and blood sugar are both borderline elevated.  We do not need to start meds for either of these at this point but she should continue to work on diet and exercise and we can recheck in a year.

## 2022-06-28 NOTE — Telephone Encounter (Signed)
Patient called in regards to lab results and what to do when it comes to the vitamin deficiency. Read message below to patient and she verbalized understanding. Informed patient that these could be obtained OTC. Patient states her body has hard time absorbing vitamins when taken orally due to intestinal issues in past.   Patient is requesting a regimen that could help her body absorb these vitamins better. Please Advise.

## 2022-06-30 NOTE — Telephone Encounter (Signed)
B12 can be taking as a sublingual tablet that dissolves under her tongue.  This bypasses her intestines and helps with absorption.  Vitamin D has to be taken orally.  She can try taking a higher dose if it 1000 IUs is not enough however we should recheck in 3 months before making any significant changes.

## 2022-07-20 ENCOUNTER — Ambulatory Visit (INDEPENDENT_AMBULATORY_CARE_PROVIDER_SITE_OTHER): Payer: BC Managed Care – PPO | Admitting: Behavioral Health

## 2022-07-20 DIAGNOSIS — F419 Anxiety disorder, unspecified: Secondary | ICD-10-CM | POA: Diagnosis not present

## 2022-07-20 DIAGNOSIS — Z630 Problems in relationship with spouse or partner: Secondary | ICD-10-CM

## 2022-07-20 NOTE — Progress Notes (Addendum)
[facility] Counselor Initial Adult Exam  Name: Donna [name] Date: 08/01/2022 MRN: [id number] DOB: 10/31/1965 PCP: Vivi Barrack, MD  Time spent: 60 min In Person @ [place]:  Self    Paperwork requested: No   Reason for Visit /Presenting Problem: Elevated anx/dep & inc'd stressors due to work & Family issues. Pt exp'g anger w/[employer]. Pt had 3 sig losses of Pat Gparents & a beloved Aunt in [place].  Mental Status Exam: Appearance:   Casual     Behavior:  Appropriate and Sharing  Motor:  Normal  Speech/Language:   Clear and Coherent  Affect:  Appropriate  Mood:  anxious  Thought process:  normal  Thought content:    WNL  Sensory/Perceptual disturbances:    WNL  Orientation:  oriented to person, place, and time/date  Attention:  Good  Concentration:  Good  Memory:  WNL  Fund of knowledge:   Good  Insight:    Good  Judgment:   Good  Impulse Control:  Fair    Risk Assessment: Danger to Self:  No Self-injurious Behavior: No Danger to Others: No Duty to Warn:no Physical Aggression / Violence:No  Access to Firearms a concern: No  Gang Involvement:No  Patient / guardian was educated about steps to take if suicide or homicide risk level increases between visits: no While future psychiatric events cannot be accurately predicted, the patient does not currently require acute inpatient psychiatric care and does not currently meet [facility] involuntary commitment criteria.  Substance Abuse History: Current substance abuse: No     Past Psychiatric History:   Panic attacks in College @ [employer] Outpatient Providers: Dimas Chyle, MD History of Psych Hospitalization: No  Psychological Testing:  NA    Abuse History:  Victim of: No.,  NA    Report needed: No. Victim of Neglect:No. Perpetrator of  NA   Witness / Exposure to Domestic Violence: No   Protective Services Involvement: No  Witness to [employer] Violence:  No    Family History:  Family History  Problem Relation Age of Onset   Heart disease Father    Hypertension Father    Stroke Father    Diabetes Father    Hyperlipidemia Father    Kidney disease Father    Sleep apnea Father    Obesity Father    Breast cancer Maternal Aunt    Breast cancer Maternal Grandmother    Anxiety disorder Mother     Living situation: the patient lives with their family  Sexual Orientation: Straight  Relationship Status: married  Name of spouse / other: Flora Lipps If a parent, number of children / ages: 2 Children w/First Husb Slava; 29yo EllaClaire who graduated from [place] w/a [address] & 28yo Son Nicoli who has ambition to join the [employer] to achieve this  Support Systems: spouse friends  Museum/gallery curator Stress:  No   Income/Employment/Disability: 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 It is a [place] that [place]. She is the [employer] in the [place] & travels nationally constantly.  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 History of legal issue / charges:  NA  Medical History/Surgical History: reviewed Past Medical History:  Diagnosis Date   Allergy    Anxiety    Constipation    Depression    Diverticulitis    Elevated BP without diagnosis of  hypertension    GERD (gastroesophageal reflux disease)    HTN (hypertension)    IBS (irritable bowel syndrome)    Night muscle spasms    back   Osteoarthritis (arthritis due to wear and tear of joints)    knees   Ovarian cyst    Pain in the abdomen    lower left quarter   Palpitations    SOB (shortness of breath) on exertion    Swelling    feet and legs    Past Surgical History:  Procedure Laterality Date   ABDOMINAL ADHESION SURGERY     acl replacement  2000   ovarian cysts      Medications: Current Outpatient Medications  Medication Sig Dispense Refill   ALPRAZolam (XANAX) 0.25 MG tablet Take 0.5 tablets (0.125 mg total) by mouth 2 (two) times daily as needed for anxiety. 20 tablet 0   escitalopram (LEXAPRO) 5 MG tablet Take 2 tablets (10 mg total) by mouth daily. 90 tablet 1   ibuprofen (ADVIL,MOTRIN) 200 MG tablet Take 200 mg by mouth every 6 (six) hours as needed for moderate pain.     levonorgestrel (MIRENA) 20 MCG/24HR IUD 1 each by Intrauterine route once.     omeprazole (PRILOSEC) 10 MG capsule Take 10 mg by mouth daily.     No current facility-administered medications for this visit.    Allergies  Allergen Reactions   Epinephrine Other (See Comments)    Shakes like shes having a seizure   Augmentin [Amoxicillin-Pot Clavulanate] Other (See Comments)    States it burns her inside    Codeine Hives and Other (See Comments)    Upset stomach   Cortisone Swelling   Levaquin [Levofloxacin In D5w] Other (See Comments)    Blisters on hands   Omnipred [Prednisolone Acetate] Other (See Comments)    Migraine, emesis   Sulfa Antibiotics Nausea And Vomiting   Sulfasalazine Nausea And Vomiting   Tequin [Gatifloxacin] Other (See Comments)    Blisters on hands   Cefdinir Nausea And Vomiting    Reports blisters on hands and feet and migraines.    Diagnoses:  Anxiety  Marital problem  Plan of Care: Donna [name] will attend all scheduled sessions every 3-4 wks. She will  try to keep a Notebook btwn sessions to record her thoughts, feelings, & challenges she encounters w/her job & her Husb as they progress through this year. This will focus our work in sessions.   Target Date:09/06/2022  Progress: 2 Frequency: 2-3 times every 8 wks  Modality: Boykin Reaper, LMFT

## 2022-07-20 NOTE — Progress Notes (Signed)
                Brendin Situ L Aliciana Ricciardi, LMFT 

## 2022-08-29 ENCOUNTER — Ambulatory Visit: Payer: BC Managed Care – PPO | Admitting: Behavioral Health

## 2022-09-12 ENCOUNTER — Ambulatory Visit (INDEPENDENT_AMBULATORY_CARE_PROVIDER_SITE_OTHER): Payer: BC Managed Care – PPO | Admitting: Behavioral Health

## 2022-09-12 DIAGNOSIS — Z630 Problems in relationship with spouse or partner: Secondary | ICD-10-CM

## 2022-09-12 DIAGNOSIS — F419 Anxiety disorder, unspecified: Secondary | ICD-10-CM | POA: Diagnosis not present

## 2022-09-12 NOTE — Progress Notes (Signed)
                Taralee Marcus L Maciel Kegg, LMFT 

## 2022-09-12 NOTE — Progress Notes (Signed)
[facility] Counselor/Therapist Progress Note  Patient ID: Donna [name], MRN: [id number],    Date: 09/12/2022  Time Spent: 55 In Person @ [facility] Office   Treatment Type: Individual Therapy  Reported Symptoms: Reduction in anx/dep & stress since her vacation time. Pt is trying to assist her Dtr to complete needed info for [employer] where she has decided to attend for her UG Degree. Pt has mitigated her stress levels that caused BPH & feels things are much improved.   Mental Status Exam: Appearance:  Casual     Behavior: Appropriate and Sharing  Motor: Normal  Speech/Language:  Clear and Coherent  Affect: Appropriate  Mood: normal  Thought process: normal  Thought content:   WNL  Sensory/Perceptual disturbances:   WNL  Orientation: oriented to person, place, and time/date  Attention: Good  Concentration: Good  Memory: WNL  Fund of knowledge:  Good  Insight:   Good  Judgment:  Good  Impulse Control: Good   Risk Assessment: Danger to Self:  No Self-injurious Behavior: No Danger to Others: No Duty to Warn:no Physical Aggression / Violence:No  Access to Firearms a concern: No  Gang Involvement:No   Subjective: Pt is upbeat & content w/work & home situations. Things have calmed down & she feels going well.    Interventions: Family Systems  Diagnosis:Anxiety  Marital problem  Plan: Donna [name] is aware of how to cope & access resources to best serve her anx/dep & stress when it occurs. Donna [name] is also aware she can call for an Appt w/Clinician prn. We will terminate psychotherapy for now & Donna [name] is aware how to access assistance in the future.  Target Date: None  Progress: 9  Frequency: TBD in future by Pt  Modality: Claretta Fraise, LMFT

## 2022-09-20 ENCOUNTER — Telehealth: Payer: BC Managed Care – PPO | Admitting: Nurse Practitioner

## 2022-09-20 DIAGNOSIS — R300 Dysuria: Secondary | ICD-10-CM | POA: Diagnosis not present

## 2022-09-20 MED ORDER — NITROFURANTOIN MONOHYD MACRO 100 MG PO CAPS: 100.0000 mg | ORAL_CAPSULE | Freq: Two times a day (BID) | ORAL | 0 refills | Status: AC

## 2022-09-20 NOTE — Progress Notes (Signed)

## 2022-11-01 ENCOUNTER — Other Ambulatory Visit: Payer: Self-pay | Admitting: Family Medicine

## 2022-11-04 ENCOUNTER — Encounter: Payer: Self-pay | Admitting: Family Medicine

## 2022-11-04 ENCOUNTER — Ambulatory Visit (INDEPENDENT_AMBULATORY_CARE_PROVIDER_SITE_OTHER): Payer: BC Managed Care – PPO | Admitting: Family Medicine

## 2022-11-04 VITALS — BP 110/70 | HR 77 | Temp 98.0°F | Ht 63.5 in | Wt 191.2 lb

## 2022-11-04 DIAGNOSIS — H9319 Tinnitus, unspecified ear: Secondary | ICD-10-CM | POA: Diagnosis not present

## 2022-11-04 DIAGNOSIS — J309 Allergic rhinitis, unspecified: Secondary | ICD-10-CM

## 2022-11-04 DIAGNOSIS — Z20818 Contact with and (suspected) exposure to other bacterial communicable diseases: Secondary | ICD-10-CM | POA: Diagnosis not present

## 2022-11-04 DIAGNOSIS — Z23 Encounter for immunization: Secondary | ICD-10-CM | POA: Diagnosis not present

## 2022-11-04 MED ORDER — AZITHROMYCIN 250 MG PO TABS: ORAL_TABLET | ORAL | 0 refills | Status: DC

## 2022-11-04 NOTE — Progress Notes (Signed)
   Donna [name] is a 57 y.o. female who presents today for an office visit.  Assessment/Plan:  New/Acute Problems: Exposure to pertussis Asymptomatic.  Will give postexposure prophylaxis per current guidelines with azithromycin.  We discussed reasons to return to care.  Will update Tdap today as well.  Chronic Problems Addressed Today: Allergic rhinitis Uses over-the-counter meds as needed though symptoms are still persistent.  Will place referral to ENT per patient request.  Tinnitus No significant abnormalities on exam today.  Will refer to ENT.     Subjective:  HPI:  Patient here with whooping cough exposure. Recently had a friend from [place] come and visit. He was sick during his time here and they had close exposure. He went back to [place] and was diagnosed with whooping cough.  She is not currently having any symptoms.  No cough.  No runny nose.  No fevers or chills.  She has had more bleeding in her ears recently.  More issues with allergies well.  Feels like crickets are in her ear.  This comes and goes.  No symptoms currently.  She did see ear nose and throat several years ago but has not seen them recently.  No hearing loss.  No ear pain.       Objective:  Physical Exam: BP 110/70 (BP Location: Left Arm, Patient Position: Sitting, Cuff Size: Large)   Pulse 77   Temp 98 F (36.7 C) (Temporal)   Ht 5' 3.5" (1.613 m)   Wt 191 lb 4 oz (86.8 kg)   SpO2 97%   BMI 33.35 kg/m   Gen: No acute distress, resting comfortably HEENT: TMs clear bilaterally. CV: Regular rate and rhythm with no murmurs appreciated Pulm: Normal work of breathing, clear to auscultation bilaterally with no crackles, wheezes, or rhonchi Neuro: Grossly normal, moves all extremities Psych: Normal affect and thought content      Lenis Nettleton M. Jimmey Ralph, MD 11/04/2022 11:56 AM

## 2022-11-04 NOTE — Patient Instructions (Signed)
It was very nice to see you today!  We will send in azithromycin.  I will also refer you to see the ear nose and throat doctor.  Return if symptoms worsen or fail to improve.   Take care, Dr Jimmey Ralph  PLEASE NOTE:  If you had any lab tests, please let us know if you have not heard back within a few days. You may see your results on mychart before we have a chance to review them but we will give you a call once they are reviewed by [place].   If we ordered any referrals today, please let us know if you have not heard from their office within the next week.   If you had any urgent prescriptions sent in today, please check with the pharmacy within an hour of our visit to make sure the prescription was transmitted appropriately.   Please try these tips to maintain a healthy lifestyle:  Eat at least 3 REAL meals and 1-2 snacks per day.  Aim for no more than 5 hours between eating.  If you eat breakfast, please do so within one hour of getting up.   Each meal should contain half fruits/vegetables, one quarter protein, and one quarter carbs (no bigger than a computer mouse)  Cut down on sweet beverages. This includes juice, soda, and sweet tea.   Drink at least 1 glass of water with each meal and aim for at least 8 glasses per day  Exercise at least 150 minutes every week.

## 2022-11-04 NOTE — Addendum Note (Signed)
Addended by: Jimmye Norman on: 11/04/2022 11:58 AM   Modules accepted: Orders

## 2022-11-04 NOTE — Assessment & Plan Note (Signed)
Uses over-the-counter meds as needed though symptoms are still persistent.  Will place referral to ENT per patient request.

## 2022-11-04 NOTE — Assessment & Plan Note (Signed)
No significant abnormalities on exam today.  Will refer to ENT.

## 2022-11-07 MED ORDER — AZITHROMYCIN 250 MG PO TABS: ORAL_TABLET | ORAL | 0 refills | Status: DC

## 2022-11-07 NOTE — Telephone Encounter (Signed)
Called [facility] and spoke to [place]ia and Rx was not received for Z-pak.

## 2022-11-16 ENCOUNTER — Encounter: Payer: Self-pay | Admitting: Family Medicine

## 2022-11-23 ENCOUNTER — Other Ambulatory Visit: Payer: Self-pay | Admitting: Family Medicine

## 2022-11-23 DIAGNOSIS — Z1231 Encounter for screening mammogram for malignant neoplasm of breast: Secondary | ICD-10-CM

## 2022-11-24 ENCOUNTER — Ambulatory Visit
Admission: RE | Admit: 2022-11-24 | Discharge: 2022-11-24 | Disposition: A | Discharge status: Home / Self Care | Payer: BC Managed Care – PPO | Source: Ambulatory Visit | Attending: Family Medicine | Admitting: Family Medicine

## 2022-11-24 DIAGNOSIS — Z1231 Encounter for screening mammogram for malignant neoplasm of breast: Secondary | ICD-10-CM | POA: Diagnosis not present

## 2022-12-22 ENCOUNTER — Institutional Professional Consult (permissible substitution) (INDEPENDENT_AMBULATORY_CARE_PROVIDER_SITE_OTHER): Payer: BC Managed Care – PPO | Admitting: Otolaryngology

## 2023-01-02 ENCOUNTER — Encounter: Payer: Self-pay | Admitting: Family Medicine

## 2023-01-02 ENCOUNTER — Ambulatory Visit (INDEPENDENT_AMBULATORY_CARE_PROVIDER_SITE_OTHER): Payer: BC Managed Care – PPO | Admitting: Otolaryngology

## 2023-01-02 ENCOUNTER — Encounter (INDEPENDENT_AMBULATORY_CARE_PROVIDER_SITE_OTHER): Payer: Self-pay | Admitting: Otolaryngology

## 2023-01-02 VITALS — BP 130/89 | HR 71 | Ht 63.0 in | Wt 190.0 lb

## 2023-01-02 DIAGNOSIS — J3489 Other specified disorders of nose and nasal sinuses: Secondary | ICD-10-CM | POA: Diagnosis not present

## 2023-01-02 DIAGNOSIS — R0982 Postnasal drip: Secondary | ICD-10-CM

## 2023-01-02 DIAGNOSIS — J342 Deviated nasal septum: Secondary | ICD-10-CM | POA: Diagnosis not present

## 2023-01-02 DIAGNOSIS — H9313 Tinnitus, bilateral: Secondary | ICD-10-CM | POA: Diagnosis not present

## 2023-01-02 DIAGNOSIS — H6993 Unspecified Eustachian tube disorder, bilateral: Secondary | ICD-10-CM | POA: Diagnosis not present

## 2023-01-02 DIAGNOSIS — R0981 Nasal congestion: Secondary | ICD-10-CM | POA: Diagnosis not present

## 2023-01-02 DIAGNOSIS — J329 Chronic sinusitis, unspecified: Secondary | ICD-10-CM

## 2023-01-02 DIAGNOSIS — J343 Hypertrophy of nasal turbinates: Secondary | ICD-10-CM

## 2023-01-02 MED ORDER — SALINE SPRAY 0.65 % NA SOLN: 1.0000 | NASAL | 0 refills | Status: DC | PRN

## 2023-01-02 MED ORDER — DESLORATADINE 5 MG PO TABS
5.0000 mg | ORAL_TABLET | Freq: Every day | ORAL | 3 refills | Status: DC
Start: 2023-01-02 — End: 2023-06-06

## 2023-01-02 MED ORDER — AZELASTINE HCL 0.1 % NA SOLN: 2.0000 | Freq: Two times a day (BID) | NASAL | 12 refills | Status: AC

## 2023-01-02 NOTE — Progress Notes (Signed)
ENT CONSULT:  Reason for Consult: nasal congestion and tinnitus    HPI: Donna [name] is an 57 y.o. female with chronic nasal congestion who is here for persistent chronic nasal congestion, inability to blow her nose and PND, muffled hearing and tinnitus. She tried Flonase and it did not work. She tried Claritin,  and Allegra in the past, and it did not work No hx of nasal surgery or sinus surgery. She has good sense of smell. At night her nasal congestion symptoms worsen.   She developed tinnitus described as b/l, sounds like "crickets." She is able to pop her ears. She chews gum when flying, has to fly frequently for work related trips to [place]. She gets car sick a lot and at times vomits. She thinks it is related to her sinus/nasal congestion. Not on any nasal sprays or systemic antihistamine.  Allergy testing done 2003, and given Flonase and anithistamine then. No allergy shots in the past.   Records Reviewed:   Chronic Problems: Allergic rhinitis Uses over-the-counter meds as needed though symptoms are still persistent.  Will place referral to ENT per patient request.   Tinnitus No significant abnormalities on exam today.  Will refer to ENT.    Past Medical History:  Diagnosis Date   Allergy    Anxiety    Constipation    Depression    Diverticulitis    Elevated BP without diagnosis of hypertension    GERD (gastroesophageal reflux disease)    HTN (hypertension)    IBS (irritable bowel syndrome)    Night muscle spasms    back   Osteoarthritis (arthritis due to wear and tear of joints)    knees   Ovarian cyst    Pain in the abdomen    lower left quarter   Palpitations    SOB (shortness of breath) on exertion    Swelling    feet and legs    Past Surgical History:  Procedure Laterality Date   ABDOMINAL ADHESION SURGERY     acl replacement  2000   ovarian cysts      Family History  Problem Relation Age of Onset   Heart disease Father    Hypertension Father     Stroke Father    Diabetes Father    Hyperlipidemia Father    Kidney disease Father    Sleep apnea Father    Obesity Father    Breast cancer Maternal Aunt    Breast cancer Maternal Grandmother    Anxiety disorder Mother     Social History:  reports that she has never smoked. She has never used smokeless tobacco. She reports current alcohol use. She reports that she does not use drugs.  Allergies:  Allergies  Allergen Reactions   Epinephrine Other (See Comments)    Shakes like shes having a seizure   Augmentin [Amoxicillin-Pot Clavulanate] Other (See Comments)    States it burns her inside    Codeine Hives and Other (See Comments)    Upset stomach   Cortisone Swelling   Levaquin [Levofloxacin In D5w] Other (See Comments)    Blisters on hands   Omnipred [Prednisolone Acetate] Other (See Comments)    Migraine, emesis   Sulfa Antibiotics Nausea And Vomiting   Sulfasalazine Nausea And Vomiting   Tequin [Gatifloxacin] Other (See Comments)    Blisters on hands   Cefdinir Nausea And Vomiting    Reports blisters on hands and feet and migraines.    Medications: I have reviewed the patient's current  medications.  The PMH, PSH, Medications, Allergies, and SH were reviewed and updated.  ROS: Constitutional: Negative for fever, weight loss and weight gain. Cardiovascular: Negative for chest pain and dyspnea on exertion. Respiratory: Is not experiencing shortness of breath at rest. Gastrointestinal: Negative for nausea and vomiting. Neurological: Negative for headaches. Psychiatric: The patient is not nervous/anxious  Blood pressure 130/89, pulse 71, height 5\' 3"  (1.6 m), weight 190 lb (86.2 kg), SpO2 96%.  PHYSICAL EXAM:  Exam: General: Well-developed, well-nourished Communication and Voice: Clear pitch and clarity Respiratory Respiratory effort: Equal inspiration and expiration without stridor Cardiovascular Peripheral Vascular: Warm extremities with equal  color/perfusion Eyes: No nystagmus with equal extraocular motion bilaterally Neuro/Psych/Balance: Patient oriented to person, place, and time; Appropriate mood and affect; Gait is intact with no imbalance; Cranial nerves I-XII are intact Head and Face Inspection: Normocephalic and atraumatic without mass or lesion Palpation: Facial skeleton intact without bony stepoffs Salivary Glands: No mass or tenderness Facial Strength: Facial motility symmetric and full bilaterally ENT Pinna: External ear intact and fully developed External canal: Canal is patent with intact skin Tympanic Membrane: Clear and mobile External Nose: No scar or anatomic deformity Internal Nose: Septum is deviated to the left with caudal spur on the right and narrowing of nasal passages. No polyp, or purulence. Mucosal edema and erythema present. Bilateral inferior turbinate hypertrophy.  Lips, Teeth, and gums: Mucosa and teeth intact and viable TMJ: No pain to palpation with full mobility Oral cavity/oropharynx: No erythema or exudate, no lesions present Nasopharynx: No mass or lesion with intact mucosa Neck Neck and Trachea: Midline trachea without mass or lesion Thyroid: No mass or nodularity Lymphatics: No lymphadenopathy  Procedure:   PROCEDURE NOTE: nasal endoscopy  Preoperative diagnosis: chronic sinusitis symptoms  Postoperative diagnosis: same  Procedure: Diagnostic nasal endoscopy (52841)  Surgeon: Ashok Croon, M.D.  Anesthesia: Topical lidocaine and Afrin  H&P REVIEW: The patient's history and physical were reviewed today prior to procedure. All medications were reviewed and updated as well. Complications: None Condition is stable throughout exam Indications and consent: The patient presents with symptoms of chronic sinusitis not responding to previous therapies. All the risks, benefits, and potential complications were reviewed with the patient preoperatively and informed consent was obtained.  The time out was completed with confirmation of the correct procedure.   Procedure: The patient was seated upright in the clinic. Topical lidocaine and Afrin were applied to the nasal cavity. After adequate anesthesia had occurred, the rigid nasal endoscope was passed into the nasal cavity. The nasal mucosa, turbinates, septum, and sinus drainage pathways were visualized bilaterally. This revealed no purulence or significant secretions that might be cultured. There were no polyps or sites of significant inflammation. The mucosa was intact and there was no crusting present. The scope was then slowly withdrawn and the patient tolerated the procedure well. There were no complications or blood loss.       Studies Reviewed:none  Assessment/Plan: Encounter Diagnoses  Name Primary?   Chronic sinusitis, unspecified location Yes   Tinnitus of both ears    Dysfunction of both eustachian tubes    Nasal congestion    Nasal obstruction    Post-nasal drainage    Deviated nasal septum    Hypertrophy of both inferior nasal turbinates    57 year old female with history of chronic longstanding nasal congestion postnasal drainage and sensation of nasal blockage as well as bilateral tinnitus and muffled hearing.  She reports frequent flying and that exacerbates her issue.  Not on  medications or nasal sprays.  No prior sinus surgery or nasal surgery.  No prior imaging.  No prior hearing evaluations.  Reports seasickness which at times leads to frontal headache and nausea vomiting, no vertigo.   On my exam today there was evidence of septal deviation and significant narrowing of bilateral nasal passages worse on the left and mucosal edema otherwise ear exam was unremarkable without evidence of ear infection or obvious air-fluid level in the middle ear.  There were no polyps or purulence on bilateral nasal endoscopy.  Exam findings were discussed with the patient will proceed with hearing evaluation and imaging of  her sinuses to rule out chronic sinus inflammation.  Will initiate antihistamine and azelastine since she previously did not tolerate Flonase.  She will return after testing.  DDx includes seasonal environmental allergies and ETD in the setting of nasal congestion affecting hearing versus conductive or sensorineural hearing loss.   - schedule CT sinuses  - schedule hearing test  - start Clarinex and Azelastine (did not tolerate Flonase before) and use Ocean Spray and nasal lubricant for dryness in the nose - return after testing   Thank you for allowing me to participate in the care of this patient. Please do not hesitate to contact me with any questions or concerns.   Ashok Croon, MD Otolaryngology [facility] Phone: [phone] Fax: [phone]    01/02/2023, 5:35 PM

## 2023-01-02 NOTE — Patient Instructions (Addendum)
-   schedule CT sinuses  - schedule hearing test  - start Clarinex and Azelastine - return after testing

## 2023-01-04 ENCOUNTER — Other Ambulatory Visit (INDEPENDENT_AMBULATORY_CARE_PROVIDER_SITE_OTHER): Payer: Self-pay | Admitting: Otolaryngology

## 2023-01-04 ENCOUNTER — Ambulatory Visit (HOSPITAL_COMMUNITY)
Admission: RE | Admit: 2023-01-04 | Discharge: 2023-01-04 | Disposition: A | Discharge status: Home / Self Care | Payer: BC Managed Care – PPO | Source: Ambulatory Visit | Attending: Otolaryngology | Admitting: Otolaryngology

## 2023-01-04 DIAGNOSIS — R0982 Postnasal drip: Secondary | ICD-10-CM

## 2023-01-04 DIAGNOSIS — H9313 Tinnitus, bilateral: Secondary | ICD-10-CM

## 2023-01-04 DIAGNOSIS — M9901 Segmental and somatic dysfunction of cervical region: Secondary | ICD-10-CM | POA: Diagnosis not present

## 2023-01-04 DIAGNOSIS — J3489 Other specified disorders of nose and nasal sinuses: Secondary | ICD-10-CM

## 2023-01-04 DIAGNOSIS — J342 Deviated nasal septum: Secondary | ICD-10-CM

## 2023-01-04 DIAGNOSIS — M9903 Segmental and somatic dysfunction of lumbar region: Secondary | ICD-10-CM | POA: Diagnosis not present

## 2023-01-04 DIAGNOSIS — M9902 Segmental and somatic dysfunction of thoracic region: Secondary | ICD-10-CM | POA: Diagnosis not present

## 2023-01-04 DIAGNOSIS — J329 Chronic sinusitis, unspecified: Secondary | ICD-10-CM | POA: Diagnosis not present

## 2023-01-04 DIAGNOSIS — M9904 Segmental and somatic dysfunction of sacral region: Secondary | ICD-10-CM | POA: Diagnosis not present

## 2023-01-04 DIAGNOSIS — J343 Hypertrophy of nasal turbinates: Secondary | ICD-10-CM

## 2023-01-04 DIAGNOSIS — H6993 Unspecified Eustachian tube disorder, bilateral: Secondary | ICD-10-CM

## 2023-01-04 DIAGNOSIS — R0981 Nasal congestion: Secondary | ICD-10-CM

## 2023-01-04 NOTE — Telephone Encounter (Signed)
See note

## 2023-01-04 NOTE — Telephone Encounter (Signed)
The ENT should set her up with an audiologist though we can refer if needed.  The CT scan does predominantly look at her sinuses but it will also look at her inner ear.  Donna [name]. Jimmey Ralph, MD 01/04/2023 9:36 AM

## 2023-01-06 DIAGNOSIS — M9902 Segmental and somatic dysfunction of thoracic region: Secondary | ICD-10-CM | POA: Diagnosis not present

## 2023-01-06 DIAGNOSIS — M9904 Segmental and somatic dysfunction of sacral region: Secondary | ICD-10-CM | POA: Diagnosis not present

## 2023-01-06 DIAGNOSIS — M9901 Segmental and somatic dysfunction of cervical region: Secondary | ICD-10-CM | POA: Diagnosis not present

## 2023-01-06 DIAGNOSIS — M9903 Segmental and somatic dysfunction of lumbar region: Secondary | ICD-10-CM | POA: Diagnosis not present

## 2023-01-09 DIAGNOSIS — S46112A Strain of muscle, fascia and tendon of long head of biceps, left arm, initial encounter: Secondary | ICD-10-CM | POA: Diagnosis not present

## 2023-01-09 DIAGNOSIS — M9903 Segmental and somatic dysfunction of lumbar region: Secondary | ICD-10-CM | POA: Diagnosis not present

## 2023-01-09 DIAGNOSIS — M9904 Segmental and somatic dysfunction of sacral region: Secondary | ICD-10-CM | POA: Diagnosis not present

## 2023-01-09 DIAGNOSIS — M9901 Segmental and somatic dysfunction of cervical region: Secondary | ICD-10-CM | POA: Diagnosis not present

## 2023-01-09 DIAGNOSIS — M9902 Segmental and somatic dysfunction of thoracic region: Secondary | ICD-10-CM | POA: Diagnosis not present

## 2023-01-12 ENCOUNTER — Ambulatory Visit: Payer: BC Managed Care – PPO | Attending: Family Medicine | Admitting: Audiologist

## 2023-01-12 DIAGNOSIS — H9193 Unspecified hearing loss, bilateral: Secondary | ICD-10-CM | POA: Diagnosis not present

## 2023-01-12 DIAGNOSIS — H9313 Tinnitus, bilateral: Secondary | ICD-10-CM | POA: Insufficient documentation

## 2023-01-13 DIAGNOSIS — M9901 Segmental and somatic dysfunction of cervical region: Secondary | ICD-10-CM | POA: Diagnosis not present

## 2023-01-13 DIAGNOSIS — M9903 Segmental and somatic dysfunction of lumbar region: Secondary | ICD-10-CM | POA: Diagnosis not present

## 2023-01-13 DIAGNOSIS — M9904 Segmental and somatic dysfunction of sacral region: Secondary | ICD-10-CM | POA: Diagnosis not present

## 2023-01-13 DIAGNOSIS — S46112A Strain of muscle, fascia and tendon of long head of biceps, left arm, initial encounter: Secondary | ICD-10-CM | POA: Diagnosis not present

## 2023-01-13 DIAGNOSIS — M9902 Segmental and somatic dysfunction of thoracic region: Secondary | ICD-10-CM | POA: Diagnosis not present

## 2023-01-13 NOTE — Procedures (Signed)
  Outpatient Audiology and [facility] [address] [place], [place]  [address] [phone]  AUDIOLOGICAL  EVALUATION  NAME: NELLYE WALCUTT     DOB:   1965/07/04      MRN: [id number]                                                                                     DATE: 01/13/2023     REFERENT: Ardith Dark, MD STATUS: Outpatient DIAGNOSIS: Tinnitus    History: Meraiah was seen for an audiological evaluation. Inetha has a bilateral cricket sounding tinnitus that has been present for many years but got worse 3 months ago.  At the time it got worse there was no precipitating event or additional stress.  She has a long history of vertigo, carsickness, ear fullness, and issues with allergies.  The crickets are present all the time even in background noise.  She feels she hears well and has no pain in either ear.  Evaluation:  Otoscopy showed a clear view of the tympanic membranes, bilaterally Tympanometry results were consistent with normal middle ear function, bilaterally   Audiometric testing was completed using conventional audiometry with supraural transducer. Speech Recognition Thresholds were 15dB in the right ear and 10dB in the left ear. Word Recognition was performed 40dB SL, scored 100% in the right ear and 100% in the left ear. Pure tone thresholds show normal hearing sloping to mild loss at [facility] only bilaterally.    Results:  The test results were reviewed with [facility].  She has a mild hearing loss at 8 kHz only.  Her hearing is symmetric and there are no indications of a medical cause of the tinnitus per her hearing test.  Management of tinnitus using masking sounds and healthy sleep habits were reviewed.  She was encouraged to follow-up with her otolaryngology and primary care provider for the vertigo.  Recommendations: Use of Sound Machine Pillow or the MyNoise App is recommended to mask tinnitus when needed.  Use of masker should be at night and when in  quiet environments where the tinnitus is loud or when around triggering sound. Masker should be played at lowest level possible that provides relief from tinnitus.    36 minutes spent testing and counseling on results.   Ammie Ferrier  Audiologist, Au.D., CCC-A 01/13/2023  9:35 AM  Cc: Ardith Dark, MD

## 2023-01-16 DIAGNOSIS — M9901 Segmental and somatic dysfunction of cervical region: Secondary | ICD-10-CM | POA: Diagnosis not present

## 2023-01-16 DIAGNOSIS — M9902 Segmental and somatic dysfunction of thoracic region: Secondary | ICD-10-CM | POA: Diagnosis not present

## 2023-01-16 DIAGNOSIS — M9903 Segmental and somatic dysfunction of lumbar region: Secondary | ICD-10-CM | POA: Diagnosis not present

## 2023-01-16 DIAGNOSIS — M9904 Segmental and somatic dysfunction of sacral region: Secondary | ICD-10-CM | POA: Diagnosis not present

## 2023-01-16 DIAGNOSIS — S46112A Strain of muscle, fascia and tendon of long head of biceps, left arm, initial encounter: Secondary | ICD-10-CM | POA: Diagnosis not present

## 2023-01-20 DIAGNOSIS — S46112A Strain of muscle, fascia and tendon of long head of biceps, left arm, initial encounter: Secondary | ICD-10-CM | POA: Diagnosis not present

## 2023-01-20 DIAGNOSIS — M9904 Segmental and somatic dysfunction of sacral region: Secondary | ICD-10-CM | POA: Diagnosis not present

## 2023-01-20 DIAGNOSIS — M9902 Segmental and somatic dysfunction of thoracic region: Secondary | ICD-10-CM | POA: Diagnosis not present

## 2023-01-20 DIAGNOSIS — M9901 Segmental and somatic dysfunction of cervical region: Secondary | ICD-10-CM | POA: Diagnosis not present

## 2023-01-20 DIAGNOSIS — M9903 Segmental and somatic dysfunction of lumbar region: Secondary | ICD-10-CM | POA: Diagnosis not present

## 2023-01-23 DIAGNOSIS — M9904 Segmental and somatic dysfunction of sacral region: Secondary | ICD-10-CM | POA: Diagnosis not present

## 2023-01-23 DIAGNOSIS — S46112A Strain of muscle, fascia and tendon of long head of biceps, left arm, initial encounter: Secondary | ICD-10-CM | POA: Diagnosis not present

## 2023-01-23 DIAGNOSIS — M9902 Segmental and somatic dysfunction of thoracic region: Secondary | ICD-10-CM | POA: Diagnosis not present

## 2023-01-23 DIAGNOSIS — M9903 Segmental and somatic dysfunction of lumbar region: Secondary | ICD-10-CM | POA: Diagnosis not present

## 2023-01-23 DIAGNOSIS — M9901 Segmental and somatic dysfunction of cervical region: Secondary | ICD-10-CM | POA: Diagnosis not present

## 2023-01-26 DIAGNOSIS — S46112A Strain of muscle, fascia and tendon of long head of biceps, left arm, initial encounter: Secondary | ICD-10-CM | POA: Diagnosis not present

## 2023-01-26 DIAGNOSIS — M9902 Segmental and somatic dysfunction of thoracic region: Secondary | ICD-10-CM | POA: Diagnosis not present

## 2023-01-26 DIAGNOSIS — M9904 Segmental and somatic dysfunction of sacral region: Secondary | ICD-10-CM | POA: Diagnosis not present

## 2023-01-26 DIAGNOSIS — M9901 Segmental and somatic dysfunction of cervical region: Secondary | ICD-10-CM | POA: Diagnosis not present

## 2023-01-26 DIAGNOSIS — M9903 Segmental and somatic dysfunction of lumbar region: Secondary | ICD-10-CM | POA: Diagnosis not present

## 2023-01-27 ENCOUNTER — Institutional Professional Consult (permissible substitution) (INDEPENDENT_AMBULATORY_CARE_PROVIDER_SITE_OTHER): Payer: BC Managed Care – PPO | Admitting: Otolaryngology

## 2023-01-27 ENCOUNTER — Ambulatory Visit (INDEPENDENT_AMBULATORY_CARE_PROVIDER_SITE_OTHER): Payer: BC Managed Care – PPO | Admitting: Otolaryngology

## 2023-01-27 NOTE — Progress Notes (Deleted)
ENT CONSULT:  Reason for Consult: nasal congestion and tinnitus    HPI: Donna [name] is an 57 y.o. female with chronic nasal congestion who is here for persistent chronic nasal congestion, inability to blow her nose and PND, muffled hearing and tinnitus. She tried Flonase and it did not work. She tried Claritin,  and Allegra in the past, and it did not work No hx of nasal surgery or sinus surgery. She has good sense of smell. At night her nasal congestion symptoms worsen.   She developed tinnitus described as b/l, sounds like "crickets." She is able to pop her ears. She chews gum when flying, has to fly frequently for work related trips to [place]. She gets car sick a lot and at times vomits. She thinks it is related to her sinus/nasal congestion. Not on any nasal sprays or systemic antihistamine.  Allergy testing done 2003, and given Flonase and anithistamine then. No allergy shots in the past.   Records Reviewed:   Chronic Problems: Allergic rhinitis Uses over-the-counter meds as needed though symptoms are still persistent.  Will place referral to ENT per patient request.   Tinnitus No significant abnormalities on exam today.  Will refer to ENT.    Past Medical History:  Diagnosis Date   Allergy    Anxiety    Constipation    Depression    Diverticulitis    Elevated BP without diagnosis of hypertension    GERD (gastroesophageal reflux disease)    HTN (hypertension)    IBS (irritable bowel syndrome)    Night muscle spasms    back   Osteoarthritis (arthritis due to wear and tear of joints)    knees   Ovarian cyst    Pain in the abdomen    lower left quarter   Palpitations    SOB (shortness of breath) on exertion    Swelling    feet and legs    Past Surgical History:  Procedure Laterality Date   ABDOMINAL ADHESION SURGERY     acl replacement  2000   ovarian cysts      Family History  Problem Relation Age of Onset   Heart disease Father    Hypertension Father     Stroke Father    Diabetes Father    Hyperlipidemia Father    Kidney disease Father    Sleep apnea Father    Obesity Father    Breast cancer Maternal Aunt    Breast cancer Maternal Grandmother    Anxiety disorder Mother     Social History:  reports that she has never smoked. She has never used smokeless tobacco. She reports current alcohol use. She reports that she does not use drugs.  Allergies:  Allergies  Allergen Reactions   Epinephrine Other (See Comments)    Shakes like shes having a seizure   Augmentin [Amoxicillin-Pot Clavulanate] Other (See Comments)    States it burns her inside    Codeine Hives and Other (See Comments)    Upset stomach   Cortisone Swelling   Levaquin [Levofloxacin In D5w] Other (See Comments)    Blisters on hands   Omnipred [Prednisolone Acetate] Other (See Comments)    Migraine, emesis   Sulfa Antibiotics Nausea And Vomiting   Sulfasalazine Nausea And Vomiting   Tequin [Gatifloxacin] Other (See Comments)    Blisters on hands   Cefdinir Nausea And Vomiting    Reports blisters on hands and feet and migraines.    Medications: I have reviewed the patient's current  medications.  The PMH, PSH, Medications, Allergies, and SH were reviewed and updated.  ROS: Constitutional: Negative for fever, weight loss and weight gain. Cardiovascular: Negative for chest pain and dyspnea on exertion. Respiratory: Is not experiencing shortness of breath at rest. Gastrointestinal: Negative for nausea and vomiting. Neurological: Negative for headaches. Psychiatric: The patient is not nervous/anxious  There were no vitals taken for this visit.  PHYSICAL EXAM:  Exam: General: Well-developed, well-nourished Communication and Voice: Clear pitch and clarity Respiratory Respiratory effort: Equal inspiration and expiration without stridor Cardiovascular Peripheral Vascular: Warm extremities with equal color/perfusion Eyes: No nystagmus with equal extraocular  motion bilaterally Neuro/Psych/Balance: Patient oriented to person, place, and time; Appropriate mood and affect; Gait is intact with no imbalance; Cranial nerves I-XII are intact Head and Face Inspection: Normocephalic and atraumatic without mass or lesion Palpation: Facial skeleton intact without bony stepoffs Salivary Glands: No mass or tenderness Facial Strength: Facial motility symmetric and full bilaterally ENT Pinna: External ear intact and fully developed External canal: Canal is patent with intact skin Tympanic Membrane: Clear and mobile External Nose: No scar or anatomic deformity Internal Nose: Septum is deviated to the left with caudal spur on the right and narrowing of nasal passages. No polyp, or purulence. Mucosal edema and erythema present. Bilateral inferior turbinate hypertrophy.  Lips, Teeth, and gums: Mucosa and teeth intact and viable TMJ: No pain to palpation with full mobility Oral cavity/oropharynx: No erythema or exudate, no lesions present Nasopharynx: No mass or lesion with intact mucosa Neck Neck and Trachea: Midline trachea without mass or lesion Thyroid: No mass or nodularity Lymphatics: No lymphadenopathy  Procedure:   PROCEDURE NOTE: nasal endoscopy  Preoperative diagnosis: chronic sinusitis symptoms  Postoperative diagnosis: same  Procedure: Diagnostic nasal endoscopy (65784)  Surgeon: Ashok Croon, M.D.  Anesthesia: Topical lidocaine and Afrin  H&P REVIEW: The patient's history and physical were reviewed today prior to procedure. All medications were reviewed and updated as well. Complications: None Condition is stable throughout exam Indications and consent: The patient presents with symptoms of chronic sinusitis not responding to previous therapies. All the risks, benefits, and potential complications were reviewed with the patient preoperatively and informed consent was obtained. The time out was completed with confirmation of the  correct procedure.   Procedure: The patient was seated upright in the clinic. Topical lidocaine and Afrin were applied to the nasal cavity. After adequate anesthesia had occurred, the rigid nasal endoscope was passed into the nasal cavity. The nasal mucosa, turbinates, septum, and sinus drainage pathways were visualized bilaterally. This revealed no purulence or significant secretions that might be cultured. There were no polyps or sites of significant inflammation. The mucosa was intact and there was no crusting present. The scope was then slowly withdrawn and the patient tolerated the procedure well. There were no complications or blood loss.       Studies Reviewed:CT max face - 01/04/23 - personally reviewed - clear sinuses - NSD and no fluid in middle ear     Assessment/Plan: No diagnosis found.  57 year old female with history of chronic longstanding nasal congestion postnasal drainage and sensation of nasal blockage as well as bilateral tinnitus and muffled hearing.  She reports frequent flying and that exacerbates her issue.  Not on medications or nasal sprays.  No prior sinus surgery or nasal surgery.  No prior imaging.  No prior hearing evaluations.  Reports seasickness which at times leads to frontal headache and nausea vomiting, no vertigo.   On my exam today there  was evidence of septal deviation and significant narrowing of bilateral nasal passages worse on the left and mucosal edema otherwise ear exam was unremarkable without evidence of ear infection or obvious air-fluid level in the middle ear.  There were no polyps or purulence on bilateral nasal endoscopy.  Exam findings were discussed with the patient will proceed with hearing evaluation and imaging of her sinuses to rule out chronic sinus inflammation.  Will initiate antihistamine and azelastine since she previously did not tolerate Flonase.  She will return after testing.  DDx includes seasonal environmental allergies and ETD in  the setting of nasal congestion affecting hearing versus conductive or sensorineural hearing loss.   - schedule CT sinuses  - schedule hearing test  - start Clarinex and Azelastine (did not tolerate Flonase before) and use Ocean Spray and nasal lubricant for dryness in the nose - return after testing   Thank you for allowing me to participate in the care of this patient. Please do not hesitate to contact me with any questions or concerns.   Ashok Croon, MD Otolaryngology [facility] Phone: [phone] Fax: [phone]    01/27/2023, 8:21 AM

## 2023-01-30 ENCOUNTER — Encounter (INDEPENDENT_AMBULATORY_CARE_PROVIDER_SITE_OTHER): Payer: Self-pay | Admitting: Otolaryngology

## 2023-01-30 ENCOUNTER — Ambulatory Visit (INDEPENDENT_AMBULATORY_CARE_PROVIDER_SITE_OTHER): Payer: BC Managed Care – PPO | Admitting: Otolaryngology

## 2023-01-30 VITALS — BP 150/88 | HR 58 | Ht 63.0 in | Wt 190.0 lb

## 2023-01-30 DIAGNOSIS — R0981 Nasal congestion: Secondary | ICD-10-CM | POA: Diagnosis not present

## 2023-01-30 DIAGNOSIS — R0982 Postnasal drip: Secondary | ICD-10-CM

## 2023-01-30 DIAGNOSIS — H903 Sensorineural hearing loss, bilateral: Secondary | ICD-10-CM

## 2023-01-30 DIAGNOSIS — J343 Hypertrophy of nasal turbinates: Secondary | ICD-10-CM

## 2023-01-30 DIAGNOSIS — Y92814 Boat as the place of occurrence of the external cause: Secondary | ICD-10-CM

## 2023-01-30 DIAGNOSIS — J342 Deviated nasal septum: Secondary | ICD-10-CM

## 2023-01-30 DIAGNOSIS — J3489 Other specified disorders of nose and nasal sinuses: Secondary | ICD-10-CM

## 2023-01-30 DIAGNOSIS — T753XXD Motion sickness, subsequent encounter: Secondary | ICD-10-CM

## 2023-01-30 DIAGNOSIS — H9313 Tinnitus, bilateral: Secondary | ICD-10-CM

## 2023-01-30 DIAGNOSIS — Y92810 Car as the place of occurrence of the external cause: Secondary | ICD-10-CM

## 2023-01-30 NOTE — Patient Instructions (Signed)
-   schedule vestibular testing  - continue Azelastine  - review coping strategies for tinnitus   WHAT IS TINNITUS?  Ringing in your ears (also known as tinnitus) can be caused by a number of conditions including age-related hearing loss, which is by far the most common cause of tinnitus.   Tinnitus can be heard in one or both sides of the head. The noises can sound like they are either from within or  outside the head. Tinnitus sounds can include ringing, roaring, buzzing, clicking, beating, whooshing, whistling,  humming, or other noises. The person may 'hear' their tinnitus all the time, or only in certain situations. Tinnitus can hurt a person's quality of life. Patients may experience symptoms at different levels of severity. Common  patient complaints include difficulty sleeping, struggling to understand other's speech, depression, and problems  focusing. These experiences could lead to problems with both work and family life.  WHAT CAUSES TINNITUS? ARE THERE RISK FACTORS?  There are two types of tinnitus: Primary and Secondary. Primary tinnitus has an unknown cause. It may or may  not be linked with hearing loss. Secondary tinnitus has a specific known cause. It may be such things like impacted earwax, or diseases or pressure behind the eardrum. Secondary tinnitus can also be related to Meniere's disease or  ear nerve conditions. Tinnitus can be caused by more unusual or serious conditions. Some of these rare conditions  include tumors, heart problems, or blood vessel problems.  Tinnitus can be seen at any age, in males or females, and in all ethnic groups. Tinnitus occurs more frequently in  males, the elderly and non-Hispanic whites. There is a higher rate of tinnitus among [employer]. Tinnitus is  also more likely to occur in people who are overweight, obese, or who have high blood pressure. Other risk factors  include diabetes, high cholesterol, or anxiety disorder. Tinnitus  is believed to be linked to long-term noise exposure.  Exposure to noise, such as firearms or loud music, is also a risk factor.  WHAT CAN YOU DO?  You should seek medical care after you notice symptoms which may help avoid misdiagnosis or delayed diagnosis.  Tinnitus can be very upsetting, and it can even be associated with depression and anxiety. Tell your doctor if you are  having a strong emotional response to your tinnitus. Tinnitus patients commonly have trouble sleeping (insomnia).  Lack of sleep can reduce the ability to pay attention. It can also lead to anger, frustration, and other negative emotions. Some patients develop a fear of being in noisy places. It is important to tell your doctor if symptoms are  affecting your daily life. Avoiding totally quiet environments by using fans, humidifiers, sound machines, etc. Trial of melatonin (3mg ) at bedtime might help with insomnia.

## 2023-01-30 NOTE — Progress Notes (Signed)
ENT Progress Note:  Update 01/30/23: Donna [name] returns for follow-up after completion of imaging of her sinuses and audiogram.  No evidence of chronic sinus inflammation with clear paranasal sinuses on CT, no evidence of fluid in the middle ear.   Donna [name] is doing Asteline daily, have not started allergy pill we prescribed (prefers to limit oral medications).  Donna [name] denies vertigo but reports chronic recurrent episodes of seasickness which sometimes is associated with nausea and vomiting, typically during long car rides or traveling.  Donna [name] tried Dramamine in the past but it makes her drowsy and Donna [name] tries to avoid it.    Initial HPI 01/02/23  Reason for Consult: nasal congestion and tinnitus    HPI: Donna [name] is an 57 y.o. female with chronic nasal congestion who is here for persistent chronic nasal congestion, inability to blow her nose and PND, muffled hearing and tinnitus. Donna [name] tried Flonase and it did not work. Donna [name] tried Claritin,  and Allegra in the past, and it did not work No hx of nasal surgery or sinus surgery. Donna [name] has good sense of smell. At night her nasal congestion symptoms worsen.   Donna [name] developed tinnitus described as b/l, sounds like "crickets." Donna [name] is able to pop her ears. Donna [name] chews gum when flying, has to fly frequently for work related trips to [place]. Donna [name] gets car sick a lot and at times vomits. Donna [name] thinks it is related to her sinus/nasal congestion. Not on any nasal sprays or systemic antihistamine.  Allergy testing done 2003, and given Flonase and anithistamine then. No allergy shots in the past.   Records Reviewed:   Chronic Problems: Allergic rhinitis Uses over-the-counter meds as needed though symptoms are still persistent.  Will place referral to ENT per patient request.   Tinnitus No significant abnormalities on exam today.  Will refer to ENT.    Past Medical History:  Diagnosis Date   Allergy    Anxiety    Constipation    Depression    Diverticulitis    Elevated  BP without diagnosis of hypertension    GERD (gastroesophageal reflux disease)    HTN (hypertension)    IBS (irritable bowel syndrome)    Night muscle spasms    back   Osteoarthritis (arthritis due to wear and tear of joints)    knees   Ovarian cyst    Pain in the abdomen    lower left quarter   Palpitations    SOB (shortness of breath) on exertion    Swelling    feet and legs    Past Surgical History:  Procedure Laterality Date   ABDOMINAL ADHESION SURGERY     acl replacement  2000   ovarian cysts      Family History  Problem Relation Age of Onset   Heart disease Father    Hypertension Father    Stroke Father    Diabetes Father    Hyperlipidemia Father    Kidney disease Father    Sleep apnea Father    Obesity Father    Breast cancer Maternal Aunt    Breast cancer Maternal Grandmother    Anxiety disorder Mother     Social History:  reports that Donna [name] has never smoked. Donna [name] has never used smokeless tobacco. Donna [name] reports current alcohol use. Donna [name] reports that Donna [name] does not use drugs.  Allergies:  Allergies  Allergen Reactions   Epinephrine Other (See Comments)    Shakes like shes having a seizure   Augmentin [Amoxicillin-Pot Clavulanate] Other (See Comments)  States it burns her inside    Codeine Hives and Other (See Comments)    Upset stomach   Cortisone Swelling   Levaquin [Levofloxacin In D5w] Other (See Comments)    Blisters on hands   Omnipred [Prednisolone Acetate] Other (See Comments)    Migraine, emesis   Sulfa Antibiotics Nausea And Vomiting   Sulfasalazine Nausea And Vomiting   Tequin [Gatifloxacin] Other (See Comments)    Blisters on hands   Cefdinir Nausea And Vomiting    Reports blisters on hands and feet and migraines.    Medications: I have reviewed the patient's current medications.  The PMH, PSH, Medications, Allergies, and SH were reviewed and updated.  ROS: Constitutional: Negative for fever, weight loss and weight  gain. Cardiovascular: Negative for chest pain and dyspnea on exertion. Respiratory: Is not experiencing shortness of breath at rest. Gastrointestinal: Negative for nausea and vomiting. Neurological: Negative for headaches. Psychiatric: The patient is not nervous/anxious  Blood pressure (!) 150/88, pulse (!) 58, height 5\' 3"  (1.6 m), weight 190 lb (86.2 kg), SpO2 96%.  PHYSICAL EXAM:  Exam: General: Well-developed, well-nourished Respiratory Respiratory effort: Equal inspiration and expiration without stridor Cardiovascular Peripheral Vascular: Warm extremities with equal color/perfusion Eyes: No nystagmus with equal extraocular motion bilaterally Neuro/Psych/Balance: Patient oriented to person, place, and time; Appropriate mood and affect; Gait is intact with no imbalance; Cranial nerves I-XII are intact Head and Face Inspection: Normocephalic and atraumatic without mass or lesion Facial Strength: Facial motility symmetric and full bilaterally ENT Pinna: External ear intact and fully developed External Nose: No scar or anatomic deformity   Procedure performed 01/02/23   PROCEDURE NOTE: nasal endoscopy  Preoperative diagnosis: chronic sinusitis symptoms  Postoperative diagnosis: same  Procedure: Diagnostic nasal endoscopy ([id number])  Surgeon: Ashok Croon, M.D.  Anesthesia: Topical lidocaine and Afrin  H&P REVIEW: The patient's history and physical were reviewed today prior to procedure. All medications were reviewed and updated as well. Complications: None Condition is stable throughout exam Indications and consent: The patient presents with symptoms of chronic sinusitis not responding to previous therapies. All the risks, benefits, and potential complications were reviewed with the patient preoperatively and informed consent was obtained. The time out was completed with confirmation of the correct procedure.   Procedure: The patient was seated upright in the clinic.  Topical lidocaine and Afrin were applied to the nasal cavity. After adequate anesthesia had occurred, the rigid nasal endoscope was passed into the nasal cavity. The nasal mucosa, turbinates, septum, and sinus drainage pathways were visualized bilaterally. This revealed no purulence or significant secretions that might be cultured. There were no polyps or sites of significant inflammation. The mucosa was intact and there was no crusting present. The scope was then slowly withdrawn and the patient tolerated the procedure well. There were no complications or blood loss.       Studies Reviewed: Audiogram 01/12/23   CT max face - 01/04/23 - personally reviewed - clear sinuses - NSD and no fluid in middle ear     Assessment/Plan: Encounter Diagnoses  Name Primary?   Sea sickness, subsequent encounter Yes   Nasal congestion    Nasal obstruction    Tinnitus of both ears    Deviated nasal septum    Hypertrophy of both inferior nasal turbinates    Post-nasal drainage    Sensorineural hearing loss (SNHL) of both ears     57 year old female with history of chronic longstanding nasal congestion postnasal drainage and sensation of nasal blockage as well  as bilateral tinnitus and muffled hearing.  Donna [name] reports frequent flying and that exacerbates her issue.  Not on medications or nasal sprays.  No prior sinus surgery or nasal surgery.  No prior imaging.  No prior hearing evaluations.  Reports seasickness which at times leads to frontal headache and nausea vomiting, no vertigo.   On my exam today there was evidence of septal deviation and significant narrowing of bilateral nasal passages worse on the left and mucosal edema otherwise ear exam was unremarkable without evidence of ear infection or obvious air-fluid level in the middle ear.  There were no polyps or purulence on bilateral nasal endoscopy.  Exam findings were discussed with the patient will proceed with hearing evaluation and imaging of her  sinuses to rule out chronic sinus inflammation.  Will initiate antihistamine and azelastine since Donna [name] previously did not tolerate Flonase.  Donna [name] will return after testing.  DDx includes seasonal environmental allergies and ETD in the setting of nasal congestion affecting hearing versus conductive or sensorineural hearing loss.   - schedule CT sinuses  - schedule hearing test  - start Clarinex and Azelastine (did not tolerate Flonase before) and use Ocean Spray and nasal lubricant for dryness in the nose - return after testing   Update 01/30/23 Donna [name] returns following hearing test which revealed mild hearing loss and high-frequency range only at 8 Hz which is sensorineural and symmetric.  Tympanograms were type A bilaterally.  Donna [name] continues to have tinnitus described as crickets sounds, received counseling on how to cope with tinnitus today and during her audiology appointment.  CT of the sinuses demonstrated septal spur with deviation to the right and inferior turban hypertrophy, but no evidence of chronic sinus inflammation.  I reviewed the images with the patient today.  Feels better on azelastine.  Elected to not start Clarinex after it was prescribed.  Reports chronic recurrent episodes of seasickness with emesis at times during her trips and long car rides, failed to feel better on Dramamine due to side effects of feeling drowsy.   Motion sickness -will refer for formal vestibular testing at [facility] to evaluate for causes of symptoms -Referral placed today 2.  Tinnitus bilateral, evidence of symmetric sensorineural hearing loss on audiogram mild, involving only high frequencies.  -White noise machine, other coping strategies, annual audiogram 3.  Nasal congestion, environmental allergies -Continue azelastine 2 puffs both nares twice daily -Consider Clarinex or other antihistamine  We will call with results of her vestibular testing once they are available  Ashok Croon, MD Otolaryngology [facility]  [facility] Phone: [phone] Fax: [phone]    01/30/2023, 8:01 PM

## 2023-02-10 DIAGNOSIS — S46112A Strain of muscle, fascia and tendon of long head of biceps, left arm, initial encounter: Secondary | ICD-10-CM | POA: Diagnosis not present

## 2023-02-10 DIAGNOSIS — M9901 Segmental and somatic dysfunction of cervical region: Secondary | ICD-10-CM | POA: Diagnosis not present

## 2023-02-10 DIAGNOSIS — M9903 Segmental and somatic dysfunction of lumbar region: Secondary | ICD-10-CM | POA: Diagnosis not present

## 2023-02-10 DIAGNOSIS — M9904 Segmental and somatic dysfunction of sacral region: Secondary | ICD-10-CM | POA: Diagnosis not present

## 2023-02-10 DIAGNOSIS — M9902 Segmental and somatic dysfunction of thoracic region: Secondary | ICD-10-CM | POA: Diagnosis not present

## 2023-02-13 DIAGNOSIS — S46112A Strain of muscle, fascia and tendon of long head of biceps, left arm, initial encounter: Secondary | ICD-10-CM | POA: Diagnosis not present

## 2023-02-13 DIAGNOSIS — M9902 Segmental and somatic dysfunction of thoracic region: Secondary | ICD-10-CM | POA: Diagnosis not present

## 2023-02-13 DIAGNOSIS — M9904 Segmental and somatic dysfunction of sacral region: Secondary | ICD-10-CM | POA: Diagnosis not present

## 2023-02-13 DIAGNOSIS — M9901 Segmental and somatic dysfunction of cervical region: Secondary | ICD-10-CM | POA: Diagnosis not present

## 2023-02-13 DIAGNOSIS — M9903 Segmental and somatic dysfunction of lumbar region: Secondary | ICD-10-CM | POA: Diagnosis not present

## 2023-02-17 DIAGNOSIS — M9904 Segmental and somatic dysfunction of sacral region: Secondary | ICD-10-CM | POA: Diagnosis not present

## 2023-02-17 DIAGNOSIS — M9903 Segmental and somatic dysfunction of lumbar region: Secondary | ICD-10-CM | POA: Diagnosis not present

## 2023-02-17 DIAGNOSIS — M9901 Segmental and somatic dysfunction of cervical region: Secondary | ICD-10-CM | POA: Diagnosis not present

## 2023-02-17 DIAGNOSIS — S46112A Strain of muscle, fascia and tendon of long head of biceps, left arm, initial encounter: Secondary | ICD-10-CM | POA: Diagnosis not present

## 2023-02-17 DIAGNOSIS — M9902 Segmental and somatic dysfunction of thoracic region: Secondary | ICD-10-CM | POA: Diagnosis not present

## 2023-02-20 ENCOUNTER — Ambulatory Visit (INDEPENDENT_AMBULATORY_CARE_PROVIDER_SITE_OTHER): Payer: BC Managed Care – PPO | Admitting: Otolaryngology

## 2023-02-20 DIAGNOSIS — M9901 Segmental and somatic dysfunction of cervical region: Secondary | ICD-10-CM | POA: Diagnosis not present

## 2023-02-20 DIAGNOSIS — M9904 Segmental and somatic dysfunction of sacral region: Secondary | ICD-10-CM | POA: Diagnosis not present

## 2023-02-20 DIAGNOSIS — S46112A Strain of muscle, fascia and tendon of long head of biceps, left arm, initial encounter: Secondary | ICD-10-CM | POA: Diagnosis not present

## 2023-02-20 DIAGNOSIS — M9903 Segmental and somatic dysfunction of lumbar region: Secondary | ICD-10-CM | POA: Diagnosis not present

## 2023-02-20 DIAGNOSIS — M9902 Segmental and somatic dysfunction of thoracic region: Secondary | ICD-10-CM | POA: Diagnosis not present

## 2023-02-28 DIAGNOSIS — M9902 Segmental and somatic dysfunction of thoracic region: Secondary | ICD-10-CM | POA: Diagnosis not present

## 2023-02-28 DIAGNOSIS — M9904 Segmental and somatic dysfunction of sacral region: Secondary | ICD-10-CM | POA: Diagnosis not present

## 2023-02-28 DIAGNOSIS — M9901 Segmental and somatic dysfunction of cervical region: Secondary | ICD-10-CM | POA: Diagnosis not present

## 2023-02-28 DIAGNOSIS — M9903 Segmental and somatic dysfunction of lumbar region: Secondary | ICD-10-CM | POA: Diagnosis not present

## 2023-03-07 DIAGNOSIS — M9903 Segmental and somatic dysfunction of lumbar region: Secondary | ICD-10-CM | POA: Diagnosis not present

## 2023-03-07 DIAGNOSIS — M9904 Segmental and somatic dysfunction of sacral region: Secondary | ICD-10-CM | POA: Diagnosis not present

## 2023-03-07 DIAGNOSIS — M9901 Segmental and somatic dysfunction of cervical region: Secondary | ICD-10-CM | POA: Diagnosis not present

## 2023-03-07 DIAGNOSIS — M9902 Segmental and somatic dysfunction of thoracic region: Secondary | ICD-10-CM | POA: Diagnosis not present

## 2023-03-21 DIAGNOSIS — M9901 Segmental and somatic dysfunction of cervical region: Secondary | ICD-10-CM | POA: Diagnosis not present

## 2023-03-21 DIAGNOSIS — M9904 Segmental and somatic dysfunction of sacral region: Secondary | ICD-10-CM | POA: Diagnosis not present

## 2023-03-21 DIAGNOSIS — M9903 Segmental and somatic dysfunction of lumbar region: Secondary | ICD-10-CM | POA: Diagnosis not present

## 2023-03-21 DIAGNOSIS — M9902 Segmental and somatic dysfunction of thoracic region: Secondary | ICD-10-CM | POA: Diagnosis not present

## 2023-04-11 DIAGNOSIS — M9902 Segmental and somatic dysfunction of thoracic region: Secondary | ICD-10-CM | POA: Diagnosis not present

## 2023-04-11 DIAGNOSIS — M9901 Segmental and somatic dysfunction of cervical region: Secondary | ICD-10-CM | POA: Diagnosis not present

## 2023-04-11 DIAGNOSIS — M9904 Segmental and somatic dysfunction of sacral region: Secondary | ICD-10-CM | POA: Diagnosis not present

## 2023-04-11 DIAGNOSIS — M9903 Segmental and somatic dysfunction of lumbar region: Secondary | ICD-10-CM | POA: Diagnosis not present

## 2023-04-28 ENCOUNTER — Other Ambulatory Visit: Payer: Self-pay | Admitting: Family Medicine

## 2023-05-28 DIAGNOSIS — Z20822 Contact with and (suspected) exposure to covid-19: Secondary | ICD-10-CM | POA: Diagnosis not present

## 2023-05-28 DIAGNOSIS — R0989 Other specified symptoms and signs involving the circulatory and respiratory systems: Secondary | ICD-10-CM | POA: Diagnosis not present

## 2023-05-28 DIAGNOSIS — R0602 Shortness of breath: Secondary | ICD-10-CM | POA: Diagnosis not present

## 2023-05-28 DIAGNOSIS — R079 Chest pain, unspecified: Secondary | ICD-10-CM | POA: Diagnosis not present

## 2023-05-28 DIAGNOSIS — R051 Acute cough: Secondary | ICD-10-CM | POA: Diagnosis not present

## 2023-05-28 DIAGNOSIS — B349 Viral infection, unspecified: Secondary | ICD-10-CM | POA: Diagnosis not present

## 2023-05-28 DIAGNOSIS — R509 Fever, unspecified: Secondary | ICD-10-CM | POA: Diagnosis not present

## 2023-05-28 DIAGNOSIS — R062 Wheezing: Secondary | ICD-10-CM | POA: Diagnosis not present

## 2023-05-28 DIAGNOSIS — R058 Other specified cough: Secondary | ICD-10-CM | POA: Diagnosis not present

## 2023-05-29 DIAGNOSIS — R9431 Abnormal electrocardiogram [ECG] [EKG]: Secondary | ICD-10-CM | POA: Diagnosis not present

## 2023-05-30 DIAGNOSIS — R079 Chest pain, unspecified: Secondary | ICD-10-CM | POA: Diagnosis not present

## 2023-06-06 ENCOUNTER — Ambulatory Visit (INDEPENDENT_AMBULATORY_CARE_PROVIDER_SITE_OTHER): Payer: BC Managed Care – PPO | Admitting: Family Medicine

## 2023-06-06 ENCOUNTER — Encounter: Payer: Self-pay | Admitting: Family Medicine

## 2023-06-06 VITALS — BP 125/80 | HR 79 | Temp 97.5°F | Ht 63.0 in | Wt 193.8 lb

## 2023-06-06 DIAGNOSIS — Z6834 Body mass index (BMI) 34.0-34.9, adult: Secondary | ICD-10-CM | POA: Diagnosis not present

## 2023-06-06 DIAGNOSIS — R059 Cough, unspecified: Secondary | ICD-10-CM | POA: Diagnosis not present

## 2023-06-06 DIAGNOSIS — E669 Obesity, unspecified: Secondary | ICD-10-CM | POA: Diagnosis not present

## 2023-06-06 MED ORDER — ZEPBOUND 2.5 MG/0.5ML ~~LOC~~ SOAJ: 2.5000 mg | SUBCUTANEOUS | 0 refills | Status: DC

## 2023-06-06 MED ORDER — BENZONATATE 200 MG PO CAPS: 200.0000 mg | ORAL_CAPSULE | Freq: Two times a day (BID) | ORAL | 0 refills | Status: DC | PRN

## 2023-06-06 NOTE — Patient Instructions (Addendum)
It was very nice to see you today!  Your symptoms will continue to improve.   Please start the tessalon.   Let me know if not improving.  We will start Zepbound.  This is a once weekly injection medication to help with weight loss.  Please send me a message in a few weeks to let us know how you are doing with this.  Return if symptoms worsen or fail to improve.   Take care, Dr Jimmey Ralph  PLEASE NOTE:  If you had any lab tests, please let us know if you have not heard back within a few days. You may see your results on mychart before we have a chance to review them but we will give you a call once they are reviewed by [place].   If we ordered any referrals today, please let us know if you have not heard from their office within the next week.   If you had any urgent prescriptions sent in today, please check with the pharmacy within an hour of our visit to make sure the prescription was transmitted appropriately.   Please try these tips to maintain a healthy lifestyle:  Eat at least 3 REAL meals and 1-2 snacks per day.  Aim for no more than 5 hours between eating.  If you eat breakfast, please do so within one hour of getting up.   Each meal should contain half fruits/vegetables, one quarter protein, and one quarter carbs (no bigger than a computer mouse)  Cut down on sweet beverages. This includes juice, soda, and sweet tea.   Drink at least 1 glass of water with each meal and aim for at least 8 glasses per day  Exercise at least 150 minutes every week.

## 2023-06-06 NOTE — Progress Notes (Signed)
   Donna [name] is a 58 y.o. female who presents today for an office visit.  Assessment/Plan:  New/Acute Problems: Cough  No red flags. She has a resolving upper respiratory infection (URI).  Likely had RSV.  Will send in Tessalon to help with cough.  She can continue over-the-counter meds as needed as well.  Anticipate this will continue to improve the next several days.  We discussed reasons to return to care and seek emergent care.  Chronic Problems Addressed Today: Obesity I lengthy discussion with patient today regarding weight loss.  She is working on diet however heart for her to lose weight.  BMI today is 34.33.  She previously did see weight loss clinic at [facility] however did not have much improvement with them.  We had discussed GLP-1 agonist in the past and discussed this again.  She does admit to needle phobia though is potentially interested in trying this.  Will start Zepbound 2.5 mg weekly.  Discussed side effects.  She will follow-up with [place] in a few to MyChart we can titrate as needed.     Subjective:  HPI:  See Assessment / plan for status of chronic conditions.  Patient here today for ED follow-up.  Went to ED 9 days ago with shortness of breath.  She had workup including chest x-ray and EKG.  These were normal.  She was diagnosed with viral syndrome and discharged home.  She was given prescription for albuterol at time of discharge.  She was told that she likely has RSV. Her flu and COVID tests were negative. Her symptoms have improved. She is still having some wheezing and crackling but this is improving.        Objective:  Physical Exam: BP 125/80   Pulse 79   Temp (!) 97.5 F (36.4 C) (Temporal)   Ht 5\' 3"  (1.6 m)   Wt 193 lb 12.8 oz (87.9 kg)   SpO2 96%   BMI 34.33 kg/m   Gen: No acute distress, resting comfortably CV: Regular rate and rhythm with no murmurs appreciated Pulm: Normal work of breathing, clear to auscultation bilaterally with no crackles,  wheezes, or rhonchi Neuro: Grossly normal, moves all extremities Psych: Normal affect and thought content  Time Spent: 45 minutes of total time was spent on the date of the encounter performing the following actions: chart review prior to seeing the patient including recent Emergency Department visit, obtaining history, performing a medically necessary exam, counseling on the treatment plan, placing orders, and documenting in our EHR.        Katina Degree. Jimmey Ralph, MD 06/06/2023 1:42 PM

## 2023-06-06 NOTE — Assessment & Plan Note (Signed)
I lengthy discussion with patient today regarding weight loss.  She is working on diet however heart for her to lose weight.  BMI today is 34.33.  She previously did see weight loss clinic at [facility] however did not have much improvement with them.  We had discussed GLP-1 agonist in the past and discussed this again.  She does admit to needle phobia though is potentially interested in trying this.  Will start Zepbound 2.5 mg weekly.  Discussed side effects.  She will follow-up with [place] in a few to MyChart we can titrate as needed.

## 2023-07-26 DIAGNOSIS — H43811 Vitreous degeneration, right eye: Secondary | ICD-10-CM | POA: Diagnosis not present

## 2023-09-08 DIAGNOSIS — H43811 Vitreous degeneration, right eye: Secondary | ICD-10-CM | POA: Diagnosis not present

## 2023-10-03 ENCOUNTER — Encounter: Payer: Self-pay | Admitting: Family Medicine

## 2023-10-03 ENCOUNTER — Ambulatory Visit (INDEPENDENT_AMBULATORY_CARE_PROVIDER_SITE_OTHER): Admitting: Family Medicine

## 2023-10-03 VITALS — BP 129/85 | HR 88 | Temp 97.7°F | Ht 63.0 in | Wt 193.8 lb

## 2023-10-03 DIAGNOSIS — K219 Gastro-esophageal reflux disease without esophagitis: Secondary | ICD-10-CM

## 2023-10-03 DIAGNOSIS — E669 Obesity, unspecified: Secondary | ICD-10-CM

## 2023-10-03 DIAGNOSIS — R739 Hyperglycemia, unspecified: Secondary | ICD-10-CM | POA: Diagnosis not present

## 2023-10-03 DIAGNOSIS — Z30432 Encounter for removal of intrauterine contraceptive device: Secondary | ICD-10-CM | POA: Diagnosis not present

## 2023-10-03 DIAGNOSIS — R1011 Right upper quadrant pain: Secondary | ICD-10-CM

## 2023-10-03 LAB — CBC WITH DIFFERENTIAL/PLATELET
Basophils Absolute: 0 10*3/uL (ref 0.0–0.1)
Basophils Relative: 0.5 % (ref 0.0–3.0)
Eosinophils Absolute: 0.1 10*3/uL (ref 0.0–0.7)
Eosinophils Relative: 1.1 % (ref 0.0–5.0)
HCT: 42.7 % (ref 36.0–46.0)
Hemoglobin: 14.6 g/dL (ref 12.0–15.0)
Lymphocytes Relative: 30.9 % (ref 12.0–46.0)
Lymphs Abs: 1.5 10*3/uL (ref 0.7–4.0)
MCHC: 34.1 g/dL (ref 30.0–36.0)
MCV: 89.9 fl (ref 78.0–100.0)
Monocytes Absolute: 0.3 10*3/uL (ref 0.1–1.0)
Monocytes Relative: 7.1 % (ref 3.0–12.0)
Neutro Abs: 2.9 10*3/uL (ref 1.4–7.7)
Neutrophils Relative %: 60.4 % (ref 43.0–77.0)
Platelets: 293 10*3/uL (ref 150.0–400.0)
RBC: 4.75 Mil/uL (ref 3.87–5.11)
RDW: 12.9 % (ref 11.5–15.5)
WBC: 4.8 10*3/uL (ref 4.0–10.5)

## 2023-10-03 LAB — COMPREHENSIVE METABOLIC PANEL WITH GFR
ALT: 18 U/L (ref 0–35)
AST: 17 U/L (ref 0–37)
Albumin: 4.4 g/dL (ref 3.5–5.2)
Alkaline Phosphatase: 76 U/L (ref 39–117)
BUN: 14 mg/dL (ref 6–23)
CO2: 25 meq/L (ref 19–32)
Calcium: 9.5 mg/dL (ref 8.4–10.5)
Chloride: 101 meq/L (ref 96–112)
Creatinine, Ser: 0.81 mg/dL (ref 0.40–1.20)
GFR: 80.65 mL/min (ref >60.00)
Glucose, Bld: 86 mg/dL (ref 70–99)
Potassium: 3.8 meq/L (ref 3.5–5.1)
Sodium: 139 meq/L (ref 135–145)
Total Bilirubin: 0.6 mg/dL (ref 0.2–1.2)
Total Protein: 7.7 g/dL (ref 6.0–8.3)

## 2023-10-03 LAB — LIPASE: Lipase: 26 U/L (ref 11.0–59.0)

## 2023-10-03 LAB — HEMOGLOBIN A1C: Hgb A1c MFr Bld: 6 % (ref 4.6–6.5)

## 2023-10-03 NOTE — Assessment & Plan Note (Signed)
 Check A1c.

## 2023-10-03 NOTE — Progress Notes (Signed)
   Donna [name] is a 58 y.o. female who presents today for an office visit.  Assessment/Plan:  New/Acute Problems: Abdominal Pain / Bloating  Overall reassuring exam with only mild tenderness on palpation of right upper quadrant.  She had cholecystectomy about 2 years ago.  Differential includes PUD, pancreatitis, gastritis, etc.  Given overall reassuring exam and improvement in symptoms do not think she needs to go to the emergency room or get stat imaging at this point.  Will check labs including CBC with differential, lipase, and c-Met.  Depending on results of labs we will likely need referral to GI.  We discussed reasons to return to care.  Chronic Problems Addressed Today: Gastroesophageal reflux disease On over-the-counter omeprazole 10 mg daily.  This does help manage her reflux symptoms though may be contributing some to her above epigastric abdominal pain.  Hyperglycemia Check A1c.  Obesity We discussed this at her recent office visit here.  She is a good candidate for GLP-1 agonist and we prescribed this at her last office visit however she was not able to for this due to having a high deductible plan.     Subjective:  HPI:  See Assessment / plan for status of chronic conditions. Patient here with intermittent abdominal pain.  Located in RUQ. She has noticed that within the last few weeks she has been having more bloating after eating or drinking anything. She did try taking a laxative which has helped. She does have hearburn and reflux but this has not changed much significantly. She has not had any persistent nausea though has had a few episodes of vomiting. No fevers or chills. No dysphagia. She is having decreased appetite and early satiety.  She is having more fatigue.  She is not currently having any pain.  No persistent constipation or diarrhea.  No reported hematemesis.  No reported melena or hematochezia.  She had RSV several months ago but otherwise no other recent  illnesses.  No fevers or chills.  She has been more fatigued recently.       Objective:  Physical Exam: BP 129/85   Pulse 88   Temp 97.7 F (36.5 C) (Temporal)   Ht 5\' 3"  (1.6 m)   Wt 193 lb 12.8 oz (87.9 kg)   SpO2 97%   BMI 34.33 kg/m   Wt Readings from Last 3 Encounters:  10/03/23 193 lb 12.8 oz (87.9 kg)  06/06/23 193 lb 12.8 oz (87.9 kg)  01/30/23 190 lb (86.2 kg)    Gen: No acute distress, resting comfortably CV: Regular rate and rhythm with no murmurs appreciated Pulm: Normal work of breathing, clear to auscultation bilaterally with no crackles, wheezes, or rhonchi Abdomen: No deformities.  Bowel sounds present.  Mild tenderness palpation in right upper quadrant.  No rebound or guarding. Neuro: Grossly normal, moves all extremities Psych: Normal affect and thought content      Akeem Heppler M. Daneil Dunker, MD 10/03/2023 9:57 AM

## 2023-10-03 NOTE — Assessment & Plan Note (Signed)
 On over-the-counter omeprazole 10 mg daily.  This does help manage her reflux symptoms though may be contributing some to her above epigastric abdominal pain.

## 2023-10-03 NOTE — Patient Instructions (Signed)
 It was very nice to see you today!  Have developed a stomach ulcer.  We will check labs today though we may need to have you see a gastroenterologist depending on results.  Let us  know if you have any change in symptoms.  Return if symptoms worsen or fail to improve.   Take care, Dr Daneil Dunker  PLEASE NOTE:  If you had any lab tests, please let us  know if you have not heard back within a few days. You may see your results on mychart before we have a chance to review them but we will give you a call once they are reviewed by us .   If we ordered any referrals today, please let us  know if you have not heard from their office within the next week.   If you had any urgent prescriptions sent in today, please check with the pharmacy within an hour of our visit to make sure the prescription was transmitted appropriately.   Please try these tips to maintain a healthy lifestyle:  Eat at least 3 REAL meals and 1-2 snacks per day.  Aim for no more than 5 hours between eating.  If you eat breakfast, please do so within one hour of getting up.   Each meal should contain half fruits/vegetables, one quarter protein, and one quarter carbs (no bigger than a computer mouse)  Cut down on sweet beverages. This includes juice, soda, and sweet tea.   Drink at least 1 glass of water with each meal and aim for at least 8 glasses per day  Exercise at least 150 minutes every week.

## 2023-10-03 NOTE — Assessment & Plan Note (Signed)
 We discussed this at her recent office visit here.  She is a good candidate for GLP-1 agonist and we prescribed this at her last office visit however she was not able to for this due to having a high deductible plan.

## 2023-10-05 ENCOUNTER — Other Ambulatory Visit: Payer: Self-pay | Admitting: *Deleted

## 2023-10-05 ENCOUNTER — Ambulatory Visit: Payer: Self-pay | Admitting: Family Medicine

## 2023-10-05 DIAGNOSIS — K259 Gastric ulcer, unspecified as acute or chronic, without hemorrhage or perforation: Secondary | ICD-10-CM

## 2023-10-05 MED ORDER — PANTOPRAZOLE SODIUM 40 MG PO TBEC: 40.0000 mg | DELAYED_RELEASE_TABLET | Freq: Every day | ORAL | 3 refills | Status: DC

## 2023-10-05 NOTE — Progress Notes (Signed)
 Her labs are all normal.  As we discussed at her office visit I am concerned that she may have developed a stomach ulcer.  Recommend that we refer her to GI though also recommend that we increase the strength of her acid blocker medication.  Please send in Protonix  40 mg daily if she is agreeable to start.  This will take place of her omeprazole for the next few weeks.

## 2023-10-13 ENCOUNTER — Ambulatory Visit: Admitting: Orthopedic Surgery

## 2023-10-15 ENCOUNTER — Encounter: Payer: Self-pay | Admitting: Family Medicine

## 2023-10-16 NOTE — Telephone Encounter (Signed)
 Recommend she stop and go back to her omeprazole.  Can we check on the status of her referral?  Jinny Mounts. Daneil Dunker, MD 10/16/2023 3:00 PM

## 2023-10-16 NOTE — Telephone Encounter (Signed)
**Note De-identified  [place] Obfuscation** Please advise 

## 2023-10-20 DIAGNOSIS — M9904 Segmental and somatic dysfunction of sacral region: Secondary | ICD-10-CM | POA: Diagnosis not present

## 2023-10-20 DIAGNOSIS — M9901 Segmental and somatic dysfunction of cervical region: Secondary | ICD-10-CM | POA: Diagnosis not present

## 2023-10-20 DIAGNOSIS — M9902 Segmental and somatic dysfunction of thoracic region: Secondary | ICD-10-CM | POA: Diagnosis not present

## 2023-10-20 DIAGNOSIS — M9903 Segmental and somatic dysfunction of lumbar region: Secondary | ICD-10-CM | POA: Diagnosis not present

## 2023-10-23 DIAGNOSIS — M62830 Muscle spasm of back: Secondary | ICD-10-CM | POA: Diagnosis not present

## 2023-10-23 DIAGNOSIS — M9903 Segmental and somatic dysfunction of lumbar region: Secondary | ICD-10-CM | POA: Diagnosis not present

## 2023-10-23 DIAGNOSIS — M9902 Segmental and somatic dysfunction of thoracic region: Secondary | ICD-10-CM | POA: Diagnosis not present

## 2023-10-23 DIAGNOSIS — M9905 Segmental and somatic dysfunction of pelvic region: Secondary | ICD-10-CM | POA: Diagnosis not present

## 2023-10-23 DIAGNOSIS — M9904 Segmental and somatic dysfunction of sacral region: Secondary | ICD-10-CM | POA: Diagnosis not present

## 2023-10-23 DIAGNOSIS — M9901 Segmental and somatic dysfunction of cervical region: Secondary | ICD-10-CM | POA: Diagnosis not present

## 2023-10-26 ENCOUNTER — Encounter: Payer: Self-pay | Admitting: Gastroenterology

## 2023-10-26 DIAGNOSIS — M9902 Segmental and somatic dysfunction of thoracic region: Secondary | ICD-10-CM | POA: Diagnosis not present

## 2023-10-26 DIAGNOSIS — M9903 Segmental and somatic dysfunction of lumbar region: Secondary | ICD-10-CM | POA: Diagnosis not present

## 2023-10-26 DIAGNOSIS — M9901 Segmental and somatic dysfunction of cervical region: Secondary | ICD-10-CM | POA: Diagnosis not present

## 2023-10-26 DIAGNOSIS — M9904 Segmental and somatic dysfunction of sacral region: Secondary | ICD-10-CM | POA: Diagnosis not present

## 2023-10-30 DIAGNOSIS — M9904 Segmental and somatic dysfunction of sacral region: Secondary | ICD-10-CM | POA: Diagnosis not present

## 2023-10-30 DIAGNOSIS — M9902 Segmental and somatic dysfunction of thoracic region: Secondary | ICD-10-CM | POA: Diagnosis not present

## 2023-10-30 DIAGNOSIS — M9901 Segmental and somatic dysfunction of cervical region: Secondary | ICD-10-CM | POA: Diagnosis not present

## 2023-10-30 DIAGNOSIS — M9903 Segmental and somatic dysfunction of lumbar region: Secondary | ICD-10-CM | POA: Diagnosis not present

## 2023-11-02 DIAGNOSIS — M9903 Segmental and somatic dysfunction of lumbar region: Secondary | ICD-10-CM | POA: Diagnosis not present

## 2023-11-02 DIAGNOSIS — M9904 Segmental and somatic dysfunction of sacral region: Secondary | ICD-10-CM | POA: Diagnosis not present

## 2023-11-02 DIAGNOSIS — M9902 Segmental and somatic dysfunction of thoracic region: Secondary | ICD-10-CM | POA: Diagnosis not present

## 2023-11-02 DIAGNOSIS — M9901 Segmental and somatic dysfunction of cervical region: Secondary | ICD-10-CM | POA: Diagnosis not present

## 2023-11-15 ENCOUNTER — Other Ambulatory Visit: Payer: Self-pay | Admitting: Family Medicine

## 2023-11-30 DIAGNOSIS — D259 Leiomyoma of uterus, unspecified: Secondary | ICD-10-CM | POA: Diagnosis not present

## 2023-11-30 DIAGNOSIS — N939 Abnormal uterine and vaginal bleeding, unspecified: Secondary | ICD-10-CM | POA: Diagnosis not present

## 2023-11-30 DIAGNOSIS — N950 Postmenopausal bleeding: Secondary | ICD-10-CM | POA: Diagnosis not present

## 2023-12-20 ENCOUNTER — Ambulatory Visit (INDEPENDENT_AMBULATORY_CARE_PROVIDER_SITE_OTHER): Admitting: Gastroenterology

## 2023-12-20 ENCOUNTER — Encounter: Payer: Self-pay | Admitting: Gastroenterology

## 2023-12-20 VITALS — BP 100/80 | HR 97 | Ht 63.0 in | Wt 196.2 lb

## 2023-12-20 DIAGNOSIS — K219 Gastro-esophageal reflux disease without esophagitis: Secondary | ICD-10-CM

## 2023-12-20 DIAGNOSIS — K5909 Other constipation: Secondary | ICD-10-CM | POA: Diagnosis not present

## 2023-12-20 DIAGNOSIS — Z8719 Personal history of other diseases of the digestive system: Secondary | ICD-10-CM

## 2023-12-20 MED ORDER — LINACLOTIDE 72 MCG PO CAPS: 72.0000 ug | ORAL_CAPSULE | Freq: Every day | ORAL | Status: DC

## 2023-12-20 MED ORDER — OMEPRAZOLE 40 MG PO CPDR: 40.0000 mg | DELAYED_RELEASE_CAPSULE | Freq: Every day | ORAL | 3 refills | Status: DC

## 2023-12-20 NOTE — Progress Notes (Unsigned)
 12/20/2023 Donna [name] [id number] 1965-06-29   HISTORY OF PRESENT ILLNESS:  This is a 58 year old female with PMH listed below including diverticulitis with complication requiring sigmoidectomy with LAR in 03/2019.  She reports long-standing GERD issues since age 27, which is the primary reason that she was referred here on this occasion.  Reports IBS symptoms with protonix .  She also reports chronic constipation/IBS.  Colonoscopy at [facility] in 03/2016 with removal of one 1 mm polyp and diverticulosis in the entire examined colon and internal hemorrhoids.  Pathology showed polypoid mucosa with lymphoid aggregate.  Past Medical History:  Diagnosis Date   Allergy    Anxiety    Constipation    Depression    Diverticulitis    Elevated BP without diagnosis of hypertension    GERD (gastroesophageal reflux disease)    HTN (hypertension)    IBS (irritable bowel syndrome)    Night muscle spasms    back   Osteoarthritis (arthritis due to wear and tear of joints)    knees   Ovarian cyst    Pain in the abdomen    lower left quarter   Palpitations    SOB (shortness of breath) on exertion    Swelling    feet and legs   Past Surgical History:  Procedure Laterality Date   ABDOMINAL ADHESION SURGERY     acl replacement  2000   CHOLECYSTECTOMY     ovarian cysts      reports that she has never smoked. She has never used smokeless tobacco. She reports current alcohol use. She reports that she does not use drugs. family history includes Anxiety disorder in her mother; Breast cancer in her maternal aunt and maternal grandmother; Diabetes in her father; Heart disease in her father; Hyperlipidemia in her father; Hypertension in her father; Kidney disease in her father; Obesity in her father; Sleep apnea in her father; Stroke in her father. Allergies  Allergen Reactions   Epinephrine Other (See Comments)    Shakes like shes having a seizure   Augmentin [Amoxicillin-Pot Clavulanate]  Other (See Comments)    States it burns her inside    Codeine Hives and Other (See Comments)    Upset stomach   Cortisone Swelling   Levaquin [Levofloxacin In D5w] Other (See Comments)    Blisters on hands   Omnipred [Prednisolone Acetate] Other (See Comments)    Migraine, emesis   Sulfa Antibiotics Nausea And Vomiting   Sulfasalazine Nausea And Vomiting   Tequin [Gatifloxacin] Other (See Comments)    Blisters on hands   Cefdinir Nausea And Vomiting    Reports blisters on hands and feet and migraines.      Outpatient Encounter Medications as of 12/20/2023  Medication Sig   ALPRAZolam  (XANAX ) 0.25 MG tablet Take 0.5 tablets (0.125 mg total) by mouth 2 (two) times daily as needed for anxiety.   azelastine  (ASTELIN ) 0.1 % nasal spray Place 2 sprays into both nostrils 2 (two) times daily. Use in each nostril as directed   escitalopram  (LEXAPRO ) 5 MG tablet TAKE 1 TABLET (5 MG TOTAL) BY MOUTH DAILY.   ibuprofen (ADVIL,MOTRIN) 200 MG tablet Take 200 mg by mouth every 6 (six) hours as needed for moderate pain.   pantoprazole  (PROTONIX ) 40 MG tablet Take 1 tablet (40 mg total) by mouth daily.   [DISCONTINUED] levonorgestrel  (MIRENA ) 20 MCG/24HR IUD 1 each by Intrauterine route once.   No facility-administered encounter medications on file as of 12/20/2023.  REVIEW OF SYSTEMS  : All other systems reviewed and negative except where noted in the History of Present Illness.   PHYSICAL EXAM: BP 100/80   Pulse 97   Ht 5' 3 (1.6 m)   Wt 196 lb 3.2 oz (89 kg)   BMI 34.76 kg/m  General: Well developed white female in no acute distress Head: Normocephalic and atraumatic Eyes:  Sclerae anicteric, conjunctiva pink. Ears: Normal auditory acuity Lungs: Clear throughout to auscultation; no W/R/R. Heart: Regular rate and rhythm; no M/R/G. Abdomen: Soft, non-distended.  BS present.  Mild diffuse TTP. Rectal: Will be done at the time of colonoscopy. Musculoskeletal: Symmetrical with no gross  deformities  Skin: No lesions on visible extremities Extremities: No edema  Neurological: Alert oriented x 4, grossly non-focal Psychological:  Alert and cooperative. Normal mood and affect  ASSESSMENT AND PLAN: *GERD:  Will start omeprazole  40 mg daily.  Will send prescription to pharmacy.  Will plan for EGD with Dr. Legrand. *Chronic constipation:  Will start Linzess  72 mcg daily.  Samples given.  She will message back in 10-14 days with an update. *History of diverticulitis requiring sigmoidectomy with LAR in 03/2019  **The risks, benefits, and alternatives to EGD were discussed with the patient and she consents to proceed.   CC:  Kennyth Worth HERO, MD

## 2023-12-20 NOTE — Patient Instructions (Addendum)
 We have sent the following medications to your pharmacy for you to pick up at your convenience: Omeprazole  40 mg daily 30-60 minutes before breakfast.   We have given you samples of the following medication to take: Linzess  72 mcg daily before breakfast.   Call or send Mychart message in 7-10 days with an update.  You have been scheduled for an endoscopy. Please follow written instructions given to you at your visit today.  If you use inhalers (even only as needed), please bring them with you on the day of your procedure.  If you take any of the following medications, they will need to be adjusted prior to your procedure:   DO NOT TAKE 7 DAYS PRIOR TO TEST- Trulicity (dulaglutide) Ozempic, Wegovy (semaglutide) Mounjaro (tirzepatide ) Bydureon Bcise (exanatide extended release)  DO NOT TAKE 1 DAY PRIOR TO YOUR TEST Rybelsus (semaglutide) Adlyxin (lixisenatide) Victoza (liraglutide) Byetta (exanatide) ___________________________________________________________________________   _______________________________________________________  If your blood pressure at your visit was 140/90 or greater, please contact your primary care physician to follow up on this.  _______________________________________________________  If you are age 9 or older, your body mass index should be between 23-30. Your Body mass index is 34.76 kg/m. If this is out of the aforementioned range listed, please consider follow up with your Primary Care Provider.  If you are age 54 or younger, your body mass index should be between 19-25. Your Body mass index is 34.76 kg/m. If this is out of the aformentioned range listed, please consider follow up with your Primary Care Provider.   ________________________________________________________  The [facility] providers would like to encourage you to use MYCHART to communicate with providers for non-urgent requests or questions.  Due to long hold times on the  telephone, sending your provider a message by [facility] may be a faster and more efficient way to get a response.  Please allow 48 business hours for a response.  Please remember that this is for non-urgent requests.  _______________________________________________________  [facility] is using a team-based approach to care.  Your team is made up of your doctor and two to three APPS. Our APPS (Nurse Practitioners and Physician Assistants) work with your physician to ensure care continuity for you. They are fully qualified to address your health concerns and develop a treatment plan. They communicate directly with your gastroenterologist to care for you. Seeing the Advanced Practice Practitioners on your physician's team can help you by facilitating care more promptly, often allowing for earlier appointments, access to diagnostic testing, procedures, and other specialty referrals.

## 2023-12-26 ENCOUNTER — Encounter: Payer: Self-pay | Admitting: Gastroenterology

## 2023-12-27 NOTE — Progress Notes (Signed)
 ____________________________________________________________  Attending physician addendum:  Thank you for sending this case to me. I have reviewed the entire note and agree with the plan.   Victory Brand, MD  ____________________________________________________________

## 2024-01-01 ENCOUNTER — Ambulatory Visit: Admitting: Gastroenterology

## 2024-01-01 ENCOUNTER — Encounter: Payer: Self-pay | Admitting: Gastroenterology

## 2024-01-01 VITALS — BP 113/80 | HR 92 | Temp 97.8°F | Resp 14 | Ht 63.0 in | Wt 196.0 lb

## 2024-01-01 DIAGNOSIS — K449 Diaphragmatic hernia without obstruction or gangrene: Secondary | ICD-10-CM

## 2024-01-01 DIAGNOSIS — K3189 Other diseases of stomach and duodenum: Secondary | ICD-10-CM

## 2024-01-01 DIAGNOSIS — K219 Gastro-esophageal reflux disease without esophagitis: Secondary | ICD-10-CM

## 2024-01-01 DIAGNOSIS — K2950 Unspecified chronic gastritis without bleeding: Secondary | ICD-10-CM | POA: Diagnosis not present

## 2024-01-01 DIAGNOSIS — K2940 Chronic atrophic gastritis without bleeding: Secondary | ICD-10-CM | POA: Diagnosis not present

## 2024-01-01 DIAGNOSIS — K317 Polyp of stomach and duodenum: Secondary | ICD-10-CM | POA: Diagnosis not present

## 2024-01-01 MED ORDER — SODIUM CHLORIDE 0.9 % IV SOLN: 500.0000 mL | Freq: Once | INTRAVENOUS | Status: DC

## 2024-01-01 MED ORDER — LINACLOTIDE 72 MCG PO CAPS: 72.0000 ug | ORAL_CAPSULE | Freq: Every day | ORAL | 3 refills | Status: DC

## 2024-01-01 NOTE — Progress Notes (Signed)
 Vss nad trans to pacu

## 2024-01-01 NOTE — Patient Instructions (Signed)
-   Resume previous diet. - Continue present medications. - Await pathology results. - Follow an antireflux regimen indefinitely. - Return to physician assistant at appointment to be scheduled regarding GERD management. - This patient will need a repeat upper endoscopy under GETA in the hospital outpatient endoscopy department for gastric polypectomy.  YOU HAD AN ENDOSCOPIC PROCEDURE TODAY AT THE [facility]:   Refer to the procedure report that was given to you for any specific questions about what was found during the examination.  If the procedure report does not answer your questions, please call your gastroenterologist to clarify.  If you requested that your care partner not be given the details of your procedure findings, then the procedure report has been included in a sealed envelope for you to review at your convenience later.  YOU SHOULD EXPECT: Some feelings of bloating in the abdomen. Passage of more gas than usual.  Walking can help get rid of the air that was put into your GI tract during the procedure and reduce the bloating. If you had a lower endoscopy (such as a colonoscopy or flexible sigmoidoscopy) you may notice spotting of blood in your stool or on the toilet paper. If you underwent a bowel prep for your procedure, you may not have a normal bowel movement for a few days.  Please Note:  You might notice some irritation and congestion in your nose or some drainage.  This is from the oxygen used during your procedure.  There is no need for concern and it should clear up in a day or so.  SYMPTOMS TO REPORT IMMEDIATELY: Following upper endoscopy (EGD)  Vomiting of blood or coffee ground material  New chest pain or pain under the shoulder blades  Painful or persistently difficult swallowing  New shortness of breath  Fever of 100F or higher  Black, tarry-looking stools  For urgent or emergent issues, a gastroenterologist can be reached at any hour by calling [phone]  [phone]. Do not use MyChart messaging for urgent concerns.    DIET:  We do recommend a small meal at first, but then you may proceed to your regular diet.  Drink plenty of fluids but you should avoid alcoholic beverages for 24 hours.  ACTIVITY:  You should plan to take it easy for the rest of today and you should NOT DRIVE or use heavy machinery until tomorrow (because of the sedation medicines used during the test).    FOLLOW UP: Our staff will call the number listed on your records the next business day following your procedure.  We will call around 7:15- 8:00 am to check on you and address any questions or concerns that you may have regarding the information given to you following your procedure. If we do not reach you, we will leave a message.     If any biopsies were taken you will be contacted by phone or by letter within the next 1-3 weeks.  Please call us  at [phone] if you have not heard about the biopsies in 3 weeks.    SIGNATURES/CONFIDENTIALITY: You and/or your care partner have signed paperwork which will be entered into your electronic medical record.  These signatures attest to the fact that that the information above on your After Visit Summary has been reviewed and is understood.  Full responsibility of the confidentiality of this discharge information lies with you and/or your care-partner.

## 2024-01-01 NOTE — Progress Notes (Signed)
 0845:  Pt coughed during procedure and went into SVT.  Pt given 20mg  esmolol and recovered qyuickly  Endoscopy resumed.  Vss nad HR 104 Sats in the 90's.

## 2024-01-01 NOTE — Progress Notes (Signed)
 Pt's states no medical or surgical changes since previsit or office visit.

## 2024-01-01 NOTE — Progress Notes (Signed)
 No significant changes to clinical history since GI office visit on 12/20/23. Here for endoscopic evaluation of long standing GERD symptoms.  The patient is appropriate for an endoscopic procedure in the ambulatory setting.  - Victory Brand, MD

## 2024-01-01 NOTE — Progress Notes (Signed)
 Called to room to assist during endoscopic procedure.  Patient ID and intended procedure confirmed with present staff. Received instructions for my participation in the procedure from the performing physician.

## 2024-01-01 NOTE — Op Note (Signed)
 [facility] Patient Name: Donna [name] Procedure Date: 01/01/2024 8:23 AM MRN: [id number] Endoscopist: Victory L. Legrand , MD, [id number] Age: 58 Referring MD:  Date of Birth: [birth year] Gender: Female Account #: [id number] Procedure:                Upper GI endoscopy Indications:              Epigastric abdominal pain (with food triggers),                            Esophageal reflux symptoms that persist despite                            appropriate therapy (regurgitation more so than                            heartburn, occurring since age 89)                           Clinical details in recent APP office consult note                            -recent use of omeprazole  40 mg has helped reduce                            symptoms somewhat. Medicines:                Monitored Anesthesia Care Procedure:                Pre-Anesthesia Assessment:                           - Prior to the procedure, a History and Physical                            was performed, and patient medications and                            allergies were reviewed. The patient's tolerance of                            previous anesthesia was also reviewed. The risks                            and benefits of the procedure and the sedation                            options and risks were discussed with the patient.                            All questions were answered, and informed consent                            was obtained. Prior Anticoagulants: The patient has  taken no anticoagulant or antiplatelet agents. ASA                            Grade Assessment: II - A patient with mild systemic                            disease. After reviewing the risks and benefits,                            the patient was deemed in satisfactory condition to                            undergo the procedure.                           After obtaining informed consent, the endoscope was                             passed under direct vision. Throughout the                            procedure, the patient's blood pressure, pulse, and                            oxygen saturations were monitored continuously. The                            Olympus Scope [id number] was introduced through the                            mouth, and advanced to the second part of duodenum.                            The upper GI endoscopy was somewhat difficult due                            to coughing upper airway spasm. Patient also                            briefly went into SVT at 160bpm requiring treatment                            with esmolol. Scope In: Scope Out: Findings:                 A 1 cm hiatal hernia was present. (Visible                            intermittently on inspiration)                           Striped erythematous mucosa was found in the                            gastric antrum.  Several biopsies were obtained in                            the gastric body and in the gastric antrum with                            cold forceps for histology. (Antrum and body in the                            same pathology jar #1 for H. pylori)                           A single small mucosal papule (nodule) was found on                            the greater curvature of the gastric body. Biopsies                            were taken with a cold forceps for histology. (Jar                            #2)                           A single 16-18 mm pedunculated polyp was found in                            the cardia. Surface erosion was present on the                            distal aspect. Biopsies were taken with a cold                            forceps for histology.                           The exam of the stomach was otherwise normal.                           The examined duodenum was normal. Complications:            No immediate complications. Estimated Blood Loss:      Estimated blood loss was minimal. Impression:               - 1 cm hiatal hernia. (Subtle, visible                            intermittently on inspiration)                           - Erythematous mucosa in the antrum.                           - A single mucosal papule (nodule) found in the  stomach. Biopsied.                           - A single gastric polyp. Biopsied.                           - Normal examined duodenum.                           - Several biopsies were obtained in the gastric                            body and in the gastric antrum. Recommendation:           - Patient has a contact number available for                            emergencies. The signs and symptoms of potential                            delayed complications were discussed with the                            patient. Return to normal activities tomorrow.                            Written discharge instructions were provided to the                            patient.                           - Resume previous diet.                           - Continue present medications.                           - Await pathology results.                           - Follow an antireflux regimen indefinitely.                           - Return to physician assistant at appointment to                            be scheduled regarding GERD management.                           - This patient will need a repeat upper endoscopy                            under GETA in the hospital outpatient endoscopy                            department for gastric polypectomy.  Lissandro Dilorenzo L. Legrand, MD 01/01/2024 9:03:55 AM This report has been signed electronically.

## 2024-01-02 ENCOUNTER — Telehealth: Payer: Self-pay

## 2024-01-02 NOTE — Telephone Encounter (Signed)
  Follow up Call-     01/01/2024    7:36 AM  Call back number  Post procedure Call Back phone  # [phone]  Permission to leave phone message Yes     Patient questions:  Do you have a fever, pain , or abdominal swelling? No. Pain Score  0 *  Have you tolerated food without any problems? Yes.    Have you been able to return to your normal activities? Yes.    Do you have any questions about your discharge instructions: Diet   No. Medications  No. Follow up visit  No.  Do you have questions or concerns about your Care? No.  Actions: * If pain score is 4 or above: No action needed, pain <4.

## 2024-01-03 ENCOUNTER — Other Ambulatory Visit: Payer: Self-pay | Admitting: Gastroenterology

## 2024-01-04 LAB — SURGICAL PATHOLOGY

## 2024-01-04 NOTE — Telephone Encounter (Signed)
Please submit.

## 2024-01-04 NOTE — Telephone Encounter (Signed)
**Note De-identified  [place] Obfuscation** Please advise 

## 2024-01-05 ENCOUNTER — Telehealth: Payer: Self-pay

## 2024-01-05 ENCOUNTER — Other Ambulatory Visit (HOSPITAL_COMMUNITY): Payer: Self-pay

## 2024-01-05 ENCOUNTER — Other Ambulatory Visit: Payer: Self-pay

## 2024-01-05 DIAGNOSIS — K317 Polyp of stomach and duodenum: Secondary | ICD-10-CM

## 2024-01-05 NOTE — Telephone Encounter (Signed)
 PA request has been Submitted. New Encounter has been or will be created for follow up. For additional info see Pharmacy Prior Auth telephone encounter from 01-05-2024.

## 2024-01-05 NOTE — Telephone Encounter (Signed)
 Spoke with patient and informed her of follow up appt scheduled with sara heinz on 10/15 and repeat EGD on 11/17 with Dr Legrand. Instructions sent to pt via portal.   She had a question about her Linzess  Rx and mentioned it needed a PA per the pharmacy. I let her know PA was pending as of today.   She also wanted to know if she would hear back about her results before October. I informed her someone would reach out to her once Dr Legrand has reviewed her results.   Patient states understanding and has no further questions at this time.

## 2024-01-05 NOTE — Telephone Encounter (Signed)
 LM regarding scheduling OV and hospital EGD. OV scheduled 10/15.

## 2024-01-05 NOTE — Addendum Note (Signed)
 Addended by: Devron Cohick B on: 01/05/2024 03:22 PM   Modules accepted: Orders

## 2024-01-05 NOTE — Telephone Encounter (Signed)
 Pharmacy Patient Advocate Encounter   Received notification from RX Request Messages that prior authorization for Linzess  72MCG capsules is required/requested.   Insurance verification completed.   The patient is insured through [facility] .   Per test claim: PA required; PA submitted to above mentioned insurance via Latent Key/confirmation #/EOC [id number] Status is pending

## 2024-01-10 ENCOUNTER — Encounter: Payer: Self-pay | Admitting: Gastroenterology

## 2024-01-16 ENCOUNTER — Ambulatory Visit: Payer: Self-pay | Admitting: Gastroenterology

## 2024-01-16 NOTE — Telephone Encounter (Signed)
Is there an update on this PA?

## 2024-01-17 ENCOUNTER — Other Ambulatory Visit (HOSPITAL_COMMUNITY): Payer: Self-pay

## 2024-01-17 NOTE — Telephone Encounter (Signed)
 Spoke with rep, she stated the original fax failed to go through. She is going to send again to me, as well as to the main office just in case the one to my number fails again.

## 2024-01-17 NOTE — Telephone Encounter (Signed)
 PA was denied. I have not received a denial letter so will have to call.

## 2024-01-18 MED ORDER — LUBIPROSTONE 8 MCG PO CAPS: 8.0000 ug | ORAL_CAPSULE | Freq: Two times a day (BID) | ORAL | 1 refills | Status: DC

## 2024-01-18 NOTE — Telephone Encounter (Signed)
 Amitiza  has been sent to the pharmacy and pt advised

## 2024-01-18 NOTE — Telephone Encounter (Signed)
 Pharmacy Patient Advocate Encounter  Received notification from [facility] that Prior Authorization for Linzess  capsules has been DENIED.  Full denial letter will be uploaded to the media tab. See denial reason below.  This medication is not on the formulary and is approved for members who have irritable bowel syndrome with constipation (a condition that causes problems passing solid waste from the body). The member must try and fail (did not work), or be unable to take ALL formulary alternatives (due to interactions, side effects, etc.), one of which must be Trulance.   In this case, other formulary alternatives are available for the member to take.   Alternative medications include: Trulance, lubiprostone  (generic Amitiza ), etc.  Please note: A separate prior authorization request may be required for any of the above alternatives.  PA #/Case ID/Reference #: [id number]

## 2024-01-18 NOTE — Telephone Encounter (Signed)
 Dr Legrand see the message from the PA team in regards to Linzess  denial

## 2024-01-18 NOTE — Telephone Encounter (Signed)
 Instead of the Linzess , we will try  Amitiza  8 mcg tablet 1 tablet twice daily Dispense 60, refill 1   H. Danis

## 2024-01-22 ENCOUNTER — Other Ambulatory Visit (HOSPITAL_COMMUNITY): Payer: Self-pay

## 2024-01-22 ENCOUNTER — Telehealth: Payer: Self-pay

## 2024-01-22 NOTE — Telephone Encounter (Signed)
 Pharmacy Patient Advocate Encounter   Received notification from Patient Advice Request messages that prior authorization for Lubiprostone  capsules is required/requested.   Insurance verification completed.   The patient is insured through [facility] .   Per test claim: PA required; PA submitted to above mentioned insurance via Latent Key/confirmation #/EOC [id number] Status is pending

## 2024-01-22 NOTE — Telephone Encounter (Signed)
 PA request has been Submitted. New Encounter has been or will be created for follow up. For additional info see Pharmacy Prior Auth telephone encounter from 01-22-2024.

## 2024-01-23 MED ORDER — TRULANCE 3 MG PO TABS: 1.0000 | ORAL_TABLET | Freq: Every day | ORAL | 0 refills | Status: AC

## 2024-01-23 NOTE — Addendum Note (Signed)
 Addended by: NICHOLAUS JARVIS on: 01/23/2024 04:00 PM   Modules accepted: Orders

## 2024-01-23 NOTE — Telephone Encounter (Signed)
 Pt is already aware of the insurance issue. My chart message sent in a different encounter. New rx for Trulance  3 mg sent to pt's pharmacy.

## 2024-01-23 NOTE — Telephone Encounter (Signed)
(  Her insurance is not making this easy.)  Please let her know about this insurance issue and we will have to try Trulance  first.  If we do not have any samples of that for her to try, then please send a prescription to her pharmacy for Trulance  3 mg once daily, dispense 30 tablets, refill 0  H. Danis MD

## 2024-01-23 NOTE — Telephone Encounter (Signed)
 Pharmacy Patient Advocate Encounter  Received notification from [facility] that Prior Authorization for Lubiprostone  capsules has been DENIED.  Full denial letter will be uploaded to the media tab. See denial reason below.  This medication is approved for members who have long-term constipation of unknown causes (chronic idiopathic constipation) when Trulance  has been tried and did not work or was not tolerated.   In this case, Trulance  has not been tried.   Please note: Trulance  requires a separate prior authorization request.  PA #/Case ID/Reference #: [id number]

## 2024-01-24 ENCOUNTER — Telehealth: Payer: Self-pay

## 2024-01-24 NOTE — Telephone Encounter (Signed)
 Pharmacy Patient Advocate Encounter   Received notification from CoverMyMeds that prior authorization for Trulance  3MG  tablets is required/requested.   Insurance verification completed.   The patient is insured through [facility] .   Per test claim: PA required; PA submitted to above mentioned insurance via Latent Key/confirmation #/EOC [id number] Status is pending

## 2024-01-24 NOTE — Telephone Encounter (Signed)
 Pt notified via [employer].

## 2024-01-24 NOTE — Telephone Encounter (Signed)
 Pharmacy Patient Advocate Encounter  Received notification from [facility] that Prior Authorization for Trulance  3MG  tablets has been APPROVED from 01-24-2024 to 01-23-2025   PA #/Case ID/Reference #: [id number]

## 2024-01-29 ENCOUNTER — Encounter: Payer: Self-pay | Admitting: Gastroenterology

## 2024-01-30 ENCOUNTER — Encounter: Payer: Self-pay | Admitting: Gastroenterology

## 2024-01-30 ENCOUNTER — Ambulatory Visit (INDEPENDENT_AMBULATORY_CARE_PROVIDER_SITE_OTHER): Admitting: Gastroenterology

## 2024-01-30 VITALS — BP 122/80 | HR 87 | Ht 63.0 in | Wt 196.0 lb

## 2024-01-30 DIAGNOSIS — K449 Diaphragmatic hernia without obstruction or gangrene: Secondary | ICD-10-CM | POA: Diagnosis not present

## 2024-01-30 DIAGNOSIS — K317 Polyp of stomach and duodenum: Secondary | ICD-10-CM

## 2024-01-30 DIAGNOSIS — K219 Gastro-esophageal reflux disease without esophagitis: Secondary | ICD-10-CM

## 2024-01-30 DIAGNOSIS — K2940 Chronic atrophic gastritis without bleeding: Secondary | ICD-10-CM

## 2024-01-30 DIAGNOSIS — K31A0 Gastric intestinal metaplasia, unspecified: Secondary | ICD-10-CM

## 2024-01-30 DIAGNOSIS — R1013 Epigastric pain: Secondary | ICD-10-CM

## 2024-01-30 DIAGNOSIS — K5909 Other constipation: Secondary | ICD-10-CM | POA: Diagnosis not present

## 2024-01-30 MED ORDER — FAMOTIDINE 40 MG PO TABS: 40.0000 mg | ORAL_TABLET | Freq: Every day | ORAL | 3 refills | Status: AC

## 2024-01-30 NOTE — Progress Notes (Signed)
 [facility] Progress Note  Chief Complaint: GERD and chronic constipation  Summary of GI history:  Clinic consult August 2025 regarding decades of constipation with bloating as well as GERD (with side effects on previous trials of pantoprazole ) 2017 colonoscopy at [facility]  EGD 01/01/2024 -patient reported that day that omeprazole  prescribed at office visit had helped reduce GERD symptoms. 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  No H. pylori on H&E stain      diminutive gastric hyperplastic polyp and larger pedunculated hyperplastic polyp with eroded surface in the gastric cardia.  1 cm sliding hiatal hernia seen intermittently on inspiration Challenging sedation-coughing led to upper airway spasm.  After procedure patient was given samples of Linzess  that significantly improved her constipation, but insurance denied it on formulary reasons, she was then given samples of Trulance .  Subjective  HPI:  Donna [name] follows up today with her husband after her recent procedure. She has multiple concerns and complaints today.  Most notably, she is having persistent regurgitation (without much heartburn), something which has been getting worse over the course of this year.  She was unable to take omeprazole  because it worsened a burning epigastric discomfort and cause diarrhea.  She also has a persistent burning epigastric pain that is worse after meals along with bloating.  She has chronic constipation for which she has recently started Linzess  with improvement.  After getting her procedure and pathology results and doing some further reading, she is particularly concerned about the findings of autoimmune gastritis as well as intestinal metaplasia and wants to know what she can do to decrease the chance of developing cancer from these issues.  ROS: Cardiovascular:  no chest pain Respiratory: no dyspnea  The patient's Past Medical, Family and Social History were  reviewed and are on file in the EMR. Past Medical History:  Diagnosis Date   Allergy    Anxiety    Constipation    Depression    Diverticulitis    Elevated BP without diagnosis of hypertension    GERD (gastroesophageal reflux disease)    HTN (hypertension)    IBS (irritable bowel syndrome)    Night muscle spasms    back   Osteoarthritis (arthritis due to wear and tear of joints)    knees   Ovarian cyst    Pain in the abdomen    lower left quarter   Palpitations    SOB (shortness of breath) on exertion    Swelling    feet and legs    Past Surgical History:  Procedure Laterality Date   ABDOMINAL ADHESION SURGERY     acl replacement  2000   CHOLECYSTECTOMY     ovarian cysts      Objective:  Med list reviewed  Current Outpatient Medications:    ALPRAZolam  (XANAX ) 0.25 MG tablet, Take 0.5 tablets (0.125 mg total) by mouth 2 (two) times daily as needed for anxiety., Disp: 20 tablet, Rfl: 0   azelastine  (ASTELIN ) 0.1 % nasal spray, Place 2 sprays into both nostrils 2 (two) times daily. Use in each nostril as directed, Disp: 30 mL, Rfl: 12   escitalopram  (LEXAPRO ) 5 MG tablet, TAKE 1 TABLET (5 MG TOTAL) BY MOUTH DAILY., Disp: 90 tablet, Rfl: 1   famotidine  (PEPCID ) 40 MG tablet, Take 1 tablet (40 mg total) by mouth at bedtime., Disp: 30 tablet, Rfl: 3   ibuprofen (ADVIL,MOTRIN) 200 MG tablet, Take 200 mg by mouth every 6 (six) hours as needed for moderate pain., Disp: ,  Rfl:    Plecanatide  (TRULANCE ) 3 MG TABS, Take 1 tablet (3 mg total) by mouth daily., Disp: 30 tablet, Rfl: 0   omeprazole  (PRILOSEC) 40 MG capsule, Take 1 capsule (40 mg total) by mouth daily. (Patient not taking: Reported on 01/30/2024), Disp: 30 capsule, Rfl: 3   Vital signs in last 24 hrs: Vitals:   01/30/24 1345  BP: 122/80  Pulse: 87   Wt Readings from Last 3 Encounters:  01/30/24 196 lb (88.9 kg)  01/01/24 196 lb (88.9 kg)  12/20/23 196 lb 3.2 oz (89 kg)    Physical Exam  No additional exam  today  Entire visit spent in review of symptoms, results and recommendations.  Labs:     Latest Ref Rng & Units 10/03/2023   10:29 AM 05/31/2022    9:41 AM 01/14/2022   11:59 PM  CBC  WBC 4.0 - 10.5 K/uL 4.8  5.6  7.5   Hemoglobin 12.0 - 15.0 g/dL 85.3  85.2  85.1   Hematocrit 36.0 - 46.0 % 42.7  42.8  41.6   Platelets 150.0 - 400.0 K/uL 293.0  322.0  321    Last B12 level normal February 2024 ___________________________________________ Radiologic studies:   ____________________________________________ Other: FINAL DIAGNOSIS       1. Surgical [P], random gastric biopsy :      - GASTRIC ANTRAL TYPE MUCOSA SHOWING PATCHY CHRONIC GASTRITIS WITH ATROPHY,      INTESTINAL METAPLASIA AND MICRONODULAR ECL CELL HYPERPLASIA, SUGGESTIVE OF      CHRONIC ATROPHIC AUTOIMMUNE GASTRITIS.SEE NOTE       2. Surgical [P], gastric nodule biopsy :      - GASTRIC HYPERPLASTIC POLYP      - NEGATIVE FOR INTESTINAL METAPLASIA OR DYSPLASIA       3. Surgical [P], gastric polyp biopsy :      - GASTRIC HYPERPLASTIC POLYP      - NEGATIVE FOR INTESTINAL METAPLASIA OR DYSPLASIA       Diagnosis Note : Immunohistochemical stain for synaptophysin highlights      micronodular ECL cell hyperplasia.      ELECTRONIC SIGNATURE : Rebbecca Md, Nilesh, Sports administrator, Electronic Signature   ADDENDUM Helicobacter pylori-like organisms are not identified on routine H&E stain. Rebbecca Md, Katrine, Sports administrator, International aid/development worker   _____________________________________________ Assessment & Plan  Assessment: Encounter Diagnoses  Name Primary?   Gastroesophageal reflux disease without esophagitis Yes   Autoimmune gastritis    Gastric polyp    Chronic constipation    Gastric intestinal metaplasia    Epigastric pain    Hiatal hernia    Regarding her GERD, it is primarily regurgitation without much heartburn (by her report).  We had a long discussion about the nature of GERD, possible contribution of the  small hiatal hernia, and pros cons as well as the limitations of acid suppression therapy in this situation because how many people have persistent nonacid reflux.  She has side effect from PPI, and says that Pepcid  worked better than anything, so I prescribed her 40 mg once daily.  She is very concerned that her symptoms represent excess acid production, but her autoimmune gastritis most likely leads to decreased gastric acid production (though sometimes does not affected if it is primarily in the antrum).  In either case, it does not cause excess acid production.  Therefore her persistent burning epigastric pain is behaving like nonulcer dyspepsia. No H. pylori was seen on H&E stains, though when we repeat the upper endoscopy I will take further  biopsies and ask for IHC to be certain there is no H. pylori (can sometimes be missed on H&E stain with atrophic gastritis)  I talked her about the possibilities of considering surgical fundoplication or TIF, but this was a lot for her to consider today.  If she were to consider that, she would need at least an upper GI series to establish some reflux and she did not have erosive esophagitis.  If it cannot be demonstrated on the UGI S, then gastric pH and impedance testing would be needed.  Having said that, even if she were to consider something like that, it would not improve her dyspepsia which is an equally bothersome symptom.  Hyperplastic gastric polyp in the cardia will be removed at the time of repeat upper endoscopy in the hospital outpatient Endo lab.  I have recommended that it be removed even though it is hyperplastic because it is eroded on the surface and if it gets bigger could cause bleeding and anemia.  Her chronic constipation which is also a longstanding issue is lately improved on Linzess , though insurance formulary may require us  to try some other medicines first.  So at this point we have prescribed her some 40 mg Pepcid  to take daily, she  will give some further consideration to whether she may have interest in speaking with Dr. San about her candidacy for TIF (with the considerations above), and we have her scheduled for an upper endoscopy at the hospital in November for the polyp removal and additional biopsies for gastric mapping and IHC to rule out H. pylori definitively.  She was discouraged to hear that there was nothing else she could do as far as diet and lifestyle to change the risks of gastric malignancy with the autoimmune gastritis or the intestinal metaplasia, though I reassured her that these risks are low and she needs periodic upper endoscopy for surveillance.  B12 level should be checked when she next sees primary care.   35 minutes were spent on this encounter (including chart review, history/exam, counseling/coordination of care, and documentation) > 50% of that time was spent on counseling and coordination of care.   Donna [name]

## 2024-01-30 NOTE — Patient Instructions (Signed)
 We have sent the following medications to your pharmacy for you to pick up at your convenience:  Pepcid .  _______________________________________________________  If your blood pressure at your visit was 140/90 or greater, please contact your primary care physician to follow up on this.  _______________________________________________________  If you are age 57 or older, your body mass index should be between 23-30. Your Body mass index is 34.72 kg/m. If this is out of the aforementioned range listed, please consider follow up with your Primary Care Provider.  If you are age 31 or younger, your body mass index should be between 19-25. Your Body mass index is 34.72 kg/m. If this is out of the aformentioned range listed, please consider follow up with your Primary Care Provider.   ________________________________________________________  The [facility] providers would like to encourage you to use MYCHART to communicate with providers for non-urgent requests or questions.  Due to long hold times on the telephone, sending your provider a message by [facility] may be a faster and more efficient way to get a response.  Please allow 48 business hours for a response.  Please remember that this is for non-urgent requests.  _______________________________________________________  [facility] is using a team-based approach to care.  Your team is made up of your doctor and two to three APPS. Our APPS (Nurse Practitioners and Physician Assistants) work with your physician to ensure care continuity for you. They are fully qualified to address your health concerns and develop a treatment plan. They communicate directly with your gastroenterologist to care for you. Seeing the Advanced Practice Practitioners on your physician's team can help you by facilitating care more promptly, often allowing for earlier appointments, access to diagnostic testing, procedures, and other specialty referrals.

## 2024-02-21 ENCOUNTER — Ambulatory Visit: Admitting: Gastroenterology

## 2024-02-23 DIAGNOSIS — M9904 Segmental and somatic dysfunction of sacral region: Secondary | ICD-10-CM | POA: Diagnosis not present

## 2024-02-23 DIAGNOSIS — N951 Menopausal and female climacteric states: Secondary | ICD-10-CM | POA: Diagnosis not present

## 2024-02-23 DIAGNOSIS — M9901 Segmental and somatic dysfunction of cervical region: Secondary | ICD-10-CM | POA: Diagnosis not present

## 2024-02-23 DIAGNOSIS — M9903 Segmental and somatic dysfunction of lumbar region: Secondary | ICD-10-CM | POA: Diagnosis not present

## 2024-02-23 DIAGNOSIS — M9902 Segmental and somatic dysfunction of thoracic region: Secondary | ICD-10-CM | POA: Diagnosis not present

## 2024-02-23 DIAGNOSIS — Z01419 Encounter for gynecological examination (general) (routine) without abnormal findings: Secondary | ICD-10-CM | POA: Diagnosis not present

## 2024-02-27 DIAGNOSIS — M9901 Segmental and somatic dysfunction of cervical region: Secondary | ICD-10-CM | POA: Diagnosis not present

## 2024-02-27 DIAGNOSIS — M9903 Segmental and somatic dysfunction of lumbar region: Secondary | ICD-10-CM | POA: Diagnosis not present

## 2024-02-27 DIAGNOSIS — M9902 Segmental and somatic dysfunction of thoracic region: Secondary | ICD-10-CM | POA: Diagnosis not present

## 2024-02-27 DIAGNOSIS — M9904 Segmental and somatic dysfunction of sacral region: Secondary | ICD-10-CM | POA: Diagnosis not present

## 2024-03-05 DIAGNOSIS — M9903 Segmental and somatic dysfunction of lumbar region: Secondary | ICD-10-CM | POA: Diagnosis not present

## 2024-03-05 DIAGNOSIS — M9904 Segmental and somatic dysfunction of sacral region: Secondary | ICD-10-CM | POA: Diagnosis not present

## 2024-03-05 DIAGNOSIS — M9901 Segmental and somatic dysfunction of cervical region: Secondary | ICD-10-CM | POA: Diagnosis not present

## 2024-03-05 DIAGNOSIS — M9902 Segmental and somatic dysfunction of thoracic region: Secondary | ICD-10-CM | POA: Diagnosis not present

## 2024-03-11 ENCOUNTER — Encounter: Payer: Self-pay | Admitting: Radiology

## 2024-03-11 DIAGNOSIS — M9903 Segmental and somatic dysfunction of lumbar region: Secondary | ICD-10-CM | POA: Diagnosis not present

## 2024-03-11 DIAGNOSIS — M9901 Segmental and somatic dysfunction of cervical region: Secondary | ICD-10-CM | POA: Diagnosis not present

## 2024-03-11 DIAGNOSIS — M9904 Segmental and somatic dysfunction of sacral region: Secondary | ICD-10-CM | POA: Diagnosis not present

## 2024-03-11 DIAGNOSIS — M9902 Segmental and somatic dysfunction of thoracic region: Secondary | ICD-10-CM | POA: Diagnosis not present

## 2024-03-13 ENCOUNTER — Other Ambulatory Visit: Payer: Self-pay | Admitting: Nurse Practitioner

## 2024-03-13 DIAGNOSIS — Z1231 Encounter for screening mammogram for malignant neoplasm of breast: Secondary | ICD-10-CM

## 2024-03-16 ENCOUNTER — Other Ambulatory Visit: Payer: Self-pay | Admitting: Gastroenterology

## 2024-03-18 ENCOUNTER — Encounter (HOSPITAL_COMMUNITY): Payer: Self-pay | Admitting: Gastroenterology

## 2024-03-18 NOTE — Progress Notes (Signed)
 Attempted to obtain medical history for pre op call via telephone, unable to reach at this time. HIPAA compliant voicemail message left requesting return call to pre surgical testing department.

## 2024-03-19 ENCOUNTER — Telehealth: Payer: Self-pay

## 2024-03-19 NOTE — Telephone Encounter (Signed)
 Procedure:EGD Procedure date: 03/25/24 Procedure location: [facility] Arrival Time: 6:00 Spoke with the patient Y/N: Y Any prep concerns? N  Has the patient obtained the prep from the pharmacy ? N Do you have a care partner and transportation: Y Any additional concerns? N

## 2024-03-25 ENCOUNTER — Encounter (HOSPITAL_COMMUNITY): Payer: Self-pay | Admitting: Gastroenterology

## 2024-03-25 ENCOUNTER — Ambulatory Visit (HOSPITAL_COMMUNITY)
Admission: RE | Admit: 2024-03-25 | Discharge: 2024-03-25 | Disposition: A | Discharge status: Home / Self Care | Attending: Gastroenterology | Admitting: Gastroenterology

## 2024-03-25 ENCOUNTER — Encounter (HOSPITAL_COMMUNITY)
Admission: RE | Disposition: A | Discharge status: Home / Self Care | Payer: Self-pay | Source: Home / Self Care | Attending: Gastroenterology

## 2024-03-25 ENCOUNTER — Ambulatory Visit (HOSPITAL_COMMUNITY): Admitting: Certified Registered Nurse Anesthetist

## 2024-03-25 ENCOUNTER — Other Ambulatory Visit: Payer: Self-pay

## 2024-03-25 DIAGNOSIS — R059 Cough, unspecified: Secondary | ICD-10-CM | POA: Diagnosis not present

## 2024-03-25 DIAGNOSIS — E66813 Obesity, class 3: Secondary | ICD-10-CM | POA: Diagnosis not present

## 2024-03-25 DIAGNOSIS — Z6833 Body mass index (BMI) 33.0-33.9, adult: Secondary | ICD-10-CM | POA: Insufficient documentation

## 2024-03-25 DIAGNOSIS — K317 Polyp of stomach and duodenum: Secondary | ICD-10-CM | POA: Diagnosis not present

## 2024-03-25 DIAGNOSIS — K2950 Unspecified chronic gastritis without bleeding: Secondary | ICD-10-CM | POA: Insufficient documentation

## 2024-03-25 DIAGNOSIS — K219 Gastro-esophageal reflux disease without esophagitis: Secondary | ICD-10-CM | POA: Insufficient documentation

## 2024-03-25 DIAGNOSIS — K2940 Chronic atrophic gastritis without bleeding: Secondary | ICD-10-CM

## 2024-03-25 DIAGNOSIS — K3189 Other diseases of stomach and duodenum: Secondary | ICD-10-CM | POA: Diagnosis not present

## 2024-03-25 DIAGNOSIS — I10 Essential (primary) hypertension: Secondary | ICD-10-CM | POA: Diagnosis not present

## 2024-03-25 DIAGNOSIS — K449 Diaphragmatic hernia without obstruction or gangrene: Secondary | ICD-10-CM | POA: Diagnosis not present

## 2024-03-25 DIAGNOSIS — K31A0 Gastric intestinal metaplasia, unspecified: Secondary | ICD-10-CM

## 2024-03-25 HISTORY — PX: SCLEROTHERAPY: SHX6841

## 2024-03-25 HISTORY — PX: POLYPECTOMY: SHX149

## 2024-03-25 HISTORY — PX: ESOPHAGOGASTRODUODENOSCOPY: SHX5428

## 2024-03-25 HISTORY — PX: BIOPSY OF SKIN SUBCUTANEOUS TISSUE AND/OR MUCOUS MEMBRANE: SHX6741

## 2024-03-25 SURGERY — EGD (ESOPHAGOGASTRODUODENOSCOPY)
Anesthesia: General

## 2024-03-25 MED ORDER — ONDANSETRON HCL 4 MG/2ML IJ SOLN
INTRAMUSCULAR | Status: DC | PRN
  Administered 2024-03-25: 4 mg via INTRAVENOUS

## 2024-03-25 MED ORDER — MIDAZOLAM HCL 2 MG/2ML IJ SOLN
INTRAMUSCULAR | Status: AC
  Filled 2024-03-25: qty 2

## 2024-03-25 MED ORDER — PROPOFOL 10 MG/ML IV BOLUS
INTRAVENOUS | Status: AC
Start: 2024-03-25 — End: 2024-03-25
  Filled 2024-03-25: qty 20

## 2024-03-25 MED ORDER — SUCCINYLCHOLINE CHLORIDE 200 MG/10ML IV SOSY
PREFILLED_SYRINGE | INTRAVENOUS | Status: DC | PRN
  Administered 2024-03-25: 100 mg via INTRAVENOUS

## 2024-03-25 MED ORDER — PROPOFOL 10 MG/ML IV BOLUS
INTRAVENOUS | Status: DC | PRN
Start: 2024-03-25 — End: 2024-03-25
  Administered 2024-03-25: 30 mg via INTRAVENOUS
  Administered 2024-03-25: 20 mg via INTRAVENOUS
  Administered 2024-03-25: 200 mg via INTRAVENOUS
  Administered 2024-03-25: 50 mg via INTRAVENOUS
  Administered 2024-03-25: 40 mg via INTRAVENOUS

## 2024-03-25 MED ORDER — SODIUM CHLORIDE (PF) 0.9 % IJ SOLN
INTRAMUSCULAR | Status: DC | PRN
  Administered 2024-03-25: 1 mL

## 2024-03-25 MED ORDER — FENTANYL CITRATE (PF) 100 MCG/2ML IJ SOLN
INTRAMUSCULAR | Status: AC
  Filled 2024-03-25: qty 2

## 2024-03-25 MED ORDER — FENTANYL CITRATE (PF) 250 MCG/5ML IJ SOLN
INTRAMUSCULAR | Status: DC | PRN
Start: 2024-03-25 — End: 2024-03-25
  Administered 2024-03-25 (×2): 50 ug via INTRAVENOUS

## 2024-03-25 MED ORDER — SODIUM CHLORIDE 0.9 % IV SOLN: INTRAVENOUS | Status: DC

## 2024-03-25 MED ORDER — EPINEPHRINE 1 MG/10ML IV SOSY
PREFILLED_SYRINGE | INTRAVENOUS | Status: AC
Start: 2024-03-25 — End: 2024-03-25
  Filled 2024-03-25: qty 10

## 2024-03-25 MED ORDER — LIDOCAINE 2% (20 MG/ML) 5 ML SYRINGE
INTRAMUSCULAR | Status: DC | PRN
Start: 2024-03-25 — End: 2024-03-25
  Administered 2024-03-25: 60 mg via INTRAVENOUS

## 2024-03-25 MED ORDER — MIDAZOLAM HCL (PF) 2 MG/2ML IJ SOLN
INTRAMUSCULAR | Status: DC | PRN
  Administered 2024-03-25: 2 mg via INTRAVENOUS

## 2024-03-25 MED ORDER — PHENYLEPHRINE 80 MCG/ML (10ML) SYRINGE FOR IV PUSH (FOR BLOOD PRESSURE SUPPORT)
PREFILLED_SYRINGE | INTRAVENOUS | Status: DC | PRN
  Administered 2024-03-25: 40 ug via INTRAVENOUS
  Administered 2024-03-25: 80 ug via INTRAVENOUS

## 2024-03-25 MED ORDER — PROPOFOL 10 MG/ML IV BOLUS
INTRAVENOUS | Status: AC
  Filled 2024-03-25: qty 20

## 2024-03-25 NOTE — Op Note (Addendum)
 [facility] Patient Name: Donna [name] Procedure Date: 03/25/2024 MRN: [id number] Attending MD: Victory CROME. [name] , MD, [id number] Date of Birth: [birth year] CSN: [id number] Age: 58 Admit Type: Outpatient Procedure:                Upper GI endoscopy Indications:              Esophageal reflux symptoms that persist despite                            appropriate therapy, For therapy of gastric polyps                            (inflamed/eroded hyperplastic polyp in the gastric                            cardia), incidental gastric intestinal metaplasia                            with micronodular ECL hyperplasia found on                            diagnostic upper endoscopy months ago. (No H.                            pylori on H&E stains)                           That procedure was made challenging by coughing.                            Thus the patient had GETA for this procedure to                            allow polypectomy. Providers:                Victory CROME. Legrand, MD, Gregoria Pierce, RN, Curtistine Bishop, Technician Referring MD:              Medicines:                General Anesthesia Complications:            No immediate complications. Estimated Blood Loss:     Estimated blood loss was minimal. Procedure:                Pre-Anesthesia Assessment:                           - Prior to the procedure, a History and Physical                            was performed, and patient medications and                            allergies were reviewed. The patient's tolerance of  previous anesthesia was also reviewed. The risks                            and benefits of the procedure and the sedation                            options and risks were discussed with the patient.                            All questions were answered, and informed consent                            was obtained. Prior Anticoagulants:  The patient has                            taken no anticoagulant or antiplatelet agents. ASA                            Grade Assessment: II - A patient with mild systemic                            disease. After reviewing the risks and benefits,                            the patient was deemed in satisfactory condition to                            undergo the procedure.                           After obtaining informed consent, the endoscope was                            passed under direct vision. Throughout the                            procedure, the patient's blood pressure, pulse, and                            oxygen saturations were monitored continuously. The                            [id number]-H190 ([id number]) Olympus endoscope was introduced                            through the mouth, and advanced to the second part                            of duodenum. The upper GI endoscopy was                            accomplished without difficulty. The patient  tolerated the procedure well. Scope In: Scope Out: Findings:      A 1 cm hiatal hernia was present. (Visible intermittently on inspiration)      The exam of the esophagus was otherwise normal.      Patchy mild inflammation characterized by erosions and erythema was       found in the gastric antrum. Several biopsies were obtained on the       greater curvature of the gastric body, on the lesser curvature of the       gastric body, at the incisura, on the greater curvature of the gastric       antrum and on the lesser curvature of the gastric antrum with cold       forceps for histology. (GIM mapping)      The entire stomach was examined under [facility] and [facility], with no suspicious       areas visualized.      A single 16-18 mm pedunculated polyp was found in the cardia. Area was       successfully injected with 1 mL of a 0.01 mg/mL solution of epinephrine       for hemostasis. The polyp was removed with a  hot snare. Resection and       retrieval were complete.(Retrieved with a net and sent for pathology) To       prevent bleeding post-intervention, one hemostatic clip was successfully       placed (MR conditional). 3 additional clips had been attempted and       deployed, but failed to get good position. Defect partially closed. Clip       manufacturer: [employer].      Interventions on this polyp were challenging due to its location as well       as scope and device mechanics.      The exam of the stomach was otherwise normal.      The examined duodenum was normal. Impression:               - 1 cm hiatal hernia.                           - Gastritis.                           - A single gastric polyp. Resected and retrieved.                            Injected. Clip (MR conditional) was placed. Clip                            manufacturer: [employer].                           - Normal examined duodenum.                           - Several biopsies were obtained on the greater                            curvature of the gastric body, on the lesser  curvature of the gastric body, at the incisura, on                            the greater curvature of the gastric antrum and on                            the lesser curvature of the gastric antrum. Moderate Sedation:      GETA Recommendation:           - Patient has a contact number available for                            emergencies. The signs and symptoms of potential                            delayed complications were discussed with the                            patient. Return to normal activities tomorrow.                            Written discharge instructions were provided to the                            patient.                           - Resume previous diet.                           - Continue present medications.                           - No aspirin, ibuprofen, naproxen, or  other                            non-steroidal anti-inflammatory drugs for 7 days                            after polyp removal.                           - Await pathology results.                           - Repeat upper endoscopy for surveillance based on                            pathology results. Procedure Code(s):        --- Professional ---                           [phone], Esophagogastroduodenoscopy, flexible,                            transoral; with removal of tumor(s), polyp(s), or  other lesion(s) by snare technique                           43239, 59, Esophagogastroduodenoscopy, flexible,                            transoral; with biopsy, single or multiple Diagnosis Code(s):        --- Professional ---                           K44.9, Diaphragmatic hernia without obstruction or                            gangrene                           K29.70, Gastritis, unspecified, without bleeding                           K31.7, Polyp of stomach and duodenum                           K21.9, Gastro-esophageal reflux disease without                            esophagitis                           K31.A0, Gastric intestinal metaplasia, unspecified CPT copyright 2022 American Medical Association. All rights reserved. The codes documented in this report are preliminary and upon coder review may  be revised to meet current compliance requirements. Donna Tow L. Legrand, MD 03/25/2024 8:41:11 AM This report has been signed electronically. Number of Addenda: 0

## 2024-03-25 NOTE — Interval H&P Note (Signed)
 History and Physical Interval Note:  03/25/2024 7:30 AM  [name]  has presented today for surgery, with the diagnosis of Gastric polyp [K31.7].  The various methods of treatment have been discussed with the patient and family. After consideration of risks, benefits and other options for treatment, the patient has consented to  Procedure(s) with comments: EGD (ESOPHAGOGASTRODUODENOSCOPY) (N/A) - GETA as a surgical intervention.  The patient's history has been reviewed, patient examined, no change in status, stable for surgery.  I have reviewed the patient's chart and labs.  Questions were answered to the patient's satisfaction.     Victory LITTIE Brand III

## 2024-03-25 NOTE — Transfer of Care (Signed)
 Immediate Anesthesia Transfer of Care Note  Patient: Andrea JINNY Curet  Procedure(s) Performed: EGD (ESOPHAGOGASTRODUODENOSCOPY) BIOPSY, SKIN, SUBCUTANEOUS TISSUE, OR MUCOUS MEMBRANE POLYPECTOMY, INTESTINE SCLEROTHERAPY  Patient Location: PACU  Anesthesia Type:General  Level of Consciousness: drowsy, patient cooperative, and responds to stimulation  Airway & Oxygen Therapy: Patient Spontanous Breathing and Patient connected to face mask oxygen  Post-op Assessment: Report given to RN and Post -op Vital signs reviewed and stable  Post vital signs: Reviewed and stable  Last Vitals:  Vitals Value Taken Time  BP 156/91 03/25/24 08:50  Temp 36.4 C 03/25/24 08:50  Pulse 117 03/25/24 08:52  Resp 25 03/25/24 08:52  SpO2 92% 03/25/24 08:52  Vitals shown include unfiled device data.  Last Pain:  Vitals:   03/25/24 0652  TempSrc: Temporal  PainSc: 0-No pain      Patients Stated Pain Goal: 0 (03/25/24 9347)  Complications: No notable events documented.

## 2024-03-25 NOTE — Discharge Instructions (Addendum)

## 2024-03-25 NOTE — Anesthesia Preprocedure Evaluation (Addendum)
 Anesthesia Evaluation  Patient identified by MRN, date of birth, ID band Patient awake    Reviewed: Allergy & Precautions, H&P , NPO status , Patient's Chart, lab work & pertinent test results  Airway Mallampati: II   Neck ROM: full    Dental   Pulmonary neg pulmonary ROS   breath sounds clear to auscultation       Cardiovascular hypertension,  Rhythm:regular Rate:Normal     Neuro/Psych  PSYCHIATRIC DISORDERS Anxiety Depression       GI/Hepatic ,GERD  ,,  Endo/Other    Class 3 obesity  Renal/GU      Musculoskeletal  (+) Arthritis ,    Abdominal   Peds  Hematology   Anesthesia Other Findings   Reproductive/Obstetrics                              Anesthesia Physical Anesthesia Plan  ASA: 2  Anesthesia Plan: General   Post-op Pain Management:    Induction: Intravenous  PONV Risk Score and Plan: 3 and Treatment may vary due to age or medical condition, Midazolam, Ondansetron  and Dexamethasone   Airway Management Planned: Oral ETT  Additional Equipment:   Intra-op Plan:   Post-operative Plan: Extubation in OR  Informed Consent: I have reviewed the patients History and Physical, chart, labs and discussed the procedure including the risks, benefits and alternatives for the proposed anesthesia with the patient or authorized representative who has indicated his/her understanding and acceptance.     Dental advisory given  Plan Discussed with: CRNA, Anesthesiologist and Surgeon  Anesthesia Plan Comments:          Anesthesia Quick Evaluation

## 2024-03-25 NOTE — Anesthesia Postprocedure Evaluation (Signed)
 Anesthesia Post Note  Patient: AVLEEN BORDWELL  Procedure(s) Performed: EGD (ESOPHAGOGASTRODUODENOSCOPY) BIOPSY, SKIN, SUBCUTANEOUS TISSUE, OR MUCOUS MEMBRANE POLYPECTOMY, INTESTINE SCLEROTHERAPY     Patient location during evaluation: PACU Anesthesia Type: General Level of consciousness: awake and alert Pain management: pain level controlled Vital Signs Assessment: post-procedure vital signs reviewed and stable Respiratory status: spontaneous breathing, nonlabored ventilation, respiratory function stable and patient connected to nasal cannula oxygen Cardiovascular status: blood pressure returned to baseline and stable Postop Assessment: no apparent nausea or vomiting Anesthetic complications: no   No notable events documented.  Last Vitals:  Vitals:   03/25/24 0900 03/25/24 0910  BP: (!) (P) 141/66 (!) 134/100  Pulse: (!) 109 (!) 101  Resp: (!) 21 (!) 21  Temp:    SpO2: 95% 96%    Last Pain:  Vitals:   03/25/24 0850  TempSrc:   PainSc: 0-No pain                 Travarius Lange S

## 2024-03-25 NOTE — Anesthesia Procedure Notes (Signed)
 Procedure Name: Intubation Date/Time: 03/25/2024 7:38 AM  Performed by: Leopoldo Wanda DASEN, CRNAPre-anesthesia Checklist: Patient identified, Emergency Drugs available, Suction available and Patient being monitored Patient Re-evaluated:Patient Re-evaluated prior to induction Oxygen Delivery Method: Circle System Utilized Preoxygenation: Pre-oxygenation with 100% oxygen Induction Type: IV induction Ventilation: Mask ventilation without difficulty Laryngoscope Size: Mac and 3 Grade View: Grade II Tube type: Oral Tube size: 7.0 mm Number of attempts: 1 Airway Equipment and Method: Stylet and Oral airway Placement Confirmation: ETT inserted through vocal cords under direct vision, positive ETCO2 and breath sounds checked- equal and bilateral Secured at: 21 cm Tube secured with: Tape Dental Injury: Teeth and Oropharynx as per pre-operative assessment

## 2024-03-25 NOTE — H&P (Signed)
 History and Physical:  This patient presents for endoscopic testing for:   Upper endoscopic evaluation today for removal of a proximal gastric polyp as well as further biopsies mapping of focal intestinal metaplasia with ECL hyperplasia on outpatient upper endoscopy.  Patient also has underlying GERD symptoms. No significant clinical changes since last office visit with me 01/30/2024.  Patient is otherwise without complaints or active issues today.   Past Medical History: Past Medical History:  Diagnosis Date   Allergy    Anxiety    Constipation    Depression    Diverticulitis    Elevated BP without diagnosis of hypertension    GERD (gastroesophageal reflux disease)    HTN (hypertension)    IBS (irritable bowel syndrome)    Night muscle spasms    back   Osteoarthritis (arthritis due to wear and tear of joints)    knees   Ovarian cyst    Pain in the abdomen    lower left quarter   Palpitations    SOB (shortness of breath) on exertion    Swelling    feet and legs     Past Surgical History: Past Surgical History:  Procedure Laterality Date   ABDOMINAL ADHESION SURGERY     acl replacement  2000   CHOLECYSTECTOMY     ovarian cysts      Allergies: Allergies  Allergen Reactions   Epinephrine Other (See Comments)    Shakes like shes having a seizure   Kenalog-10 [Triamcinolone  Acetonide] Other (See Comments)    Cortisone flush   Augmentin [Amoxicillin-Pot Clavulanate] Other (See Comments)    States it burns her inside    Cefdinir Nausea And Vomiting    Reports blisters on hands and feet and migraines.   Codeine Hives and Other (See Comments)    Upset stomach   Cortisone Swelling   Levaquin [Levofloxacin In D5w] Other (See Comments)    Blisters on hands   Omnipred [Prednisolone Acetate] Other (See Comments)    Migraine, emesis   Sulfa Antibiotics Nausea And Vomiting   Sulfasalazine Nausea And Vomiting   Tequin [Gatifloxacin] Other (See Comments)    Blisters  on hands    Outpatient Meds: Current Facility-Administered Medications  Medication Dose Route Frequency Provider Last Rate Last Admin   0.9 %  sodium chloride  infusion   Intravenous Continuous Legrand Victory CROME III, MD 20 mL/hr at 03/25/24 0705 New Bag at 03/25/24 0705      ___________________________________________________________________ Objective   Exam:  BP (!) 147/76   Temp (!) 97.4 F (36.3 C) (Temporal)   Resp 15   Ht 5' 3 (1.6 m)   Wt 86.2 kg   SpO2 92%   BMI 33.66 kg/m   CV: regular , S1/S2 Resp: clear to auscultation bilaterally, normal RR and effort noted GI: soft, no tenderness, with active bowel sounds.   Assessment: Gastric intestinal metaplasia Gastric hyperplastic polyp   Plan:  EGD with mucosal biopsies and polypectomy. General endotracheal anesthesia (prior EGD challenging with coughing.  GETA needed for control of airway and respirations for polypectomy)  The benefits and risks of the planned procedure(s) were described in detail with the patient or (when appropriate) their health care proxy.  Risks were outlined as including, but not limited to, bleeding, infection, perforation, adverse medication reaction leading to cardiac or pulmonary decompensation, pancreatitis (if ERCP).  The limitation of incomplete mucosal visualization was also discussed.  No guarantees or warranties were given.  The patient was provided an opportunity to ask  questions and all were answered. The patient agreed with the plan.   The patient is appropriate for an endoscopic procedure in the ambulatory setting.   - Victory Brand, MD

## 2024-03-27 ENCOUNTER — Encounter (HOSPITAL_COMMUNITY): Payer: Self-pay | Admitting: Gastroenterology

## 2024-03-29 ENCOUNTER — Encounter: Payer: Self-pay | Admitting: Gastroenterology

## 2024-03-29 ENCOUNTER — Ambulatory Visit: Payer: Self-pay | Admitting: Gastroenterology

## 2024-04-02 LAB — SURGICAL PATHOLOGY

## 2024-04-08 MED ORDER — METRONIDAZOLE 250 MG PO TABS: 250.0000 mg | ORAL_TABLET | Freq: Four times a day (QID) | ORAL | 0 refills | Status: AC

## 2024-04-08 MED ORDER — OMEPRAZOLE 20 MG PO CPDR: 20.0000 mg | DELAYED_RELEASE_CAPSULE | Freq: Two times a day (BID) | ORAL | 0 refills | Status: AC

## 2024-04-08 MED ORDER — TETRACYCLINE HCL 500 MG PO CAPS: 500.0000 mg | ORAL_CAPSULE | Freq: Four times a day (QID) | ORAL | 0 refills | Status: AC

## 2024-04-08 MED ORDER — BISMUTH SUBSALICYLATE 262 MG PO TABS: 2.0000 | ORAL_TABLET | Freq: Four times a day (QID) | ORAL | 0 refills | Status: AC

## 2024-04-09 ENCOUNTER — Ambulatory Visit
Admission: RE | Admit: 2024-04-09 | Discharge: 2024-04-09 | Disposition: A | Source: Ambulatory Visit | Attending: Nurse Practitioner | Admitting: Nurse Practitioner

## 2024-04-09 DIAGNOSIS — Z1231 Encounter for screening mammogram for malignant neoplasm of breast: Secondary | ICD-10-CM | POA: Diagnosis not present

## 2024-04-16 DIAGNOSIS — N951 Menopausal and female climacteric states: Secondary | ICD-10-CM | POA: Diagnosis not present

## 2024-05-20 ENCOUNTER — Other Ambulatory Visit: Payer: Self-pay | Admitting: Family Medicine

## 2024-05-31 ENCOUNTER — Other Ambulatory Visit: Payer: Self-pay

## 2024-05-31 ENCOUNTER — Telehealth: Payer: Self-pay

## 2024-05-31 DIAGNOSIS — B9681 Helicobacter pylori [H. pylori] as the cause of diseases classified elsewhere: Secondary | ICD-10-CM

## 2024-05-31 NOTE — Telephone Encounter (Signed)
-----   Message from Nurse Daphne BIRCH, RN sent at 04/08/2024 11:24 AM EST ----- Pt needs f/u [employer] testing for H Pylori protocol. Medication orders sent in on 04/08/25.

## 2024-05-31 NOTE — Telephone Encounter (Signed)
 Order placed & kit has been created and placed at 2nd floor front desk. Mychart message sent to pt.
